# Patient Record
Sex: Male | Born: 1965 | Race: White | Hispanic: No | Marital: Married | Smoking: Current every day smoker
Health system: Southern US, Community
[De-identification: ages and names within clinical notes are randomized; demographics above are authoritative.]

## PROBLEM LIST (undated history)

## (undated) DIAGNOSIS — K648 Other hemorrhoids: Secondary | ICD-10-CM

## (undated) HISTORY — DX: Other hemorrhoids: K64.8

---

## 2010-09-25 ENCOUNTER — Other Ambulatory Visit: Payer: Self-pay | Admitting: Family Medicine

## 2013-07-07 HISTORY — PX: HERNIA REPAIR: SHX51

## 2013-07-22 ENCOUNTER — Ambulatory Visit: Payer: Self-pay | Admitting: Surgery

## 2013-07-22 LAB — BASIC METABOLIC PANEL
Anion Gap: 3 — ABNORMAL LOW (ref 7–16)
BUN: 6 mg/dL — AB (ref 7–18)
CALCIUM: 9.2 mg/dL (ref 8.5–10.1)
CREATININE: 0.75 mg/dL (ref 0.60–1.30)
Chloride: 105 mmol/L (ref 98–107)
Co2: 28 mmol/L (ref 21–32)
GLUCOSE: 101 mg/dL — AB (ref 65–99)
OSMOLALITY: 270 (ref 275–301)
Potassium: 4.2 mmol/L (ref 3.5–5.1)
Sodium: 136 mmol/L (ref 136–145)

## 2013-07-22 LAB — CBC WITH DIFFERENTIAL/PLATELET
BASOS ABS: 0 10*3/uL (ref 0.0–0.1)
BASOS PCT: 0.4 %
Eosinophil #: 0.1 10*3/uL (ref 0.0–0.7)
Eosinophil %: 1.3 %
HCT: 49.3 % (ref 40.0–52.0)
HGB: 16.7 g/dL (ref 13.0–18.0)
Lymphocyte #: 2.6 10*3/uL (ref 1.0–3.6)
Lymphocyte %: 28.7 %
MCH: 32.9 pg (ref 26.0–34.0)
MCHC: 33.8 g/dL (ref 32.0–36.0)
MCV: 97 fL (ref 80–100)
MONO ABS: 0.6 x10 3/mm (ref 0.2–1.0)
MONOS PCT: 7 %
Neutrophil #: 5.6 10*3/uL (ref 1.4–6.5)
Neutrophil %: 62.6 %
Platelet: 197 10*3/uL (ref 150–440)
RBC: 5.07 10*6/uL (ref 4.40–5.90)
RDW: 13.7 % (ref 11.5–14.5)
WBC: 9 10*3/uL (ref 3.8–10.6)

## 2013-07-29 ENCOUNTER — Ambulatory Visit: Payer: Self-pay | Admitting: Surgery

## 2014-07-30 NOTE — H&P (Signed)
PATIENT NAME:  Jeffery Mccormick, Jeffery Mccormick MR#:  [id number] DATE OF BIRTH:  22-Feb-1966  DATE OF ADMISSION:  07/29/2013  CHIEF COMPLAINT:  Right inguinal hernia.   HISTORY OF PRESENT ILLNESS:  This is a patient with a right inguinal hernia.  He is 49 years old.  He has pain and bulge on the right side only.  No left-sided symptoms.  He has had this for four or five months.  He has not had any obstructive symptoms.   PAST SURGICAL HISTORY:  None.   PAST MEDICAL HISTORY:  None.   MEDICATIONS:  None.   ALLERGIES:  None.   SOCIAL HISTORY:  The patient smokes tobacco on a regular basis.   REVIEW OF SYSTEMS:   A 10 system review has been performed and negative with the exception of that mentioned in the HPI and is documented in the office chart.   PHYSICAL EXAMINATION: GENERAL:  Healthy male patient.  HEENT:  No scleral icterus.  NECK:  No palpable neck nodes.  CHEST:  Clear to auscultation.  CARDIAC:  Regular rate and rhythm.  ABDOMEN:  Soft, nontender.  EXTREMITIES:  Without edema.  NEUROLOGIC:  Grossly intact.  INTEGUMENT:  No jaundice.  GENITOURINARY:  Demonstrates a right inguinal hernia which is reducible.  No left inguinal hernia.  Normal testicles.   LABORATORY VALUES:  Not currently available, but will be reviewed prior to surgery.   ASSESSMENT AND PLAN:  This is a patient with a right inguinal hernia.  He is here for elective laparoscopic preperitoneal repair of an inguinal hernia.  The option has been discussed.  The risks of bleeding, infection, recurrence, ischemic orchitis, chronic pain syndrome and neuroma have been discussed and will be reviewed in the preop holding area.  He understands that this is the plan.    ____________________________ Jerrol Banana. Burt Knack, MD rec:ea D: 07/28/2013 16:58:18 ET T: 07/28/2013 17:27:54 ET JOB#: [id number]  cc: Jerrol Banana. Burt Knack, MD, <Dictator> Florene Glen MD ELECTRONICALLY SIGNED 07/28/2013 17:46

## 2014-07-30 NOTE — Op Note (Signed)
PATIENT NAME:  Jeffery Mccormick, BUTH MR#:  [id number] DATE OF BIRTH:  August 04, 1965  DATE OF PROCEDURE:  07/29/2013  PREOPERATIVE DIAGNOSIS: Symptomatic right inguinal hernia.   POSTOPERATIVE DIAGNOSIS: Symptomatic right inguinal hernia.   PROCEDURE: Laparoscopic preperitoneal repair of a right inguinal hernia.   SURGEON: Phoebe Perch, M.D.   ASSISTANT: Azalia Bilis, PA-S.   ANESTHESIA: General, with endotracheal tube.  INDICATIONS:  This is a patient with a right inguinal hernia requiring repair. Preoperatively, we discussed rationale for surgery, the options of observation, risks of bleeding, infection, recurrence, ischemic orchitis, chronic pain syndrome and neuroma. This was all reviewed for him in the preop holding area. He understood and agreed to proceed.   FINDINGS: Large direct space hernia, minimal or small indirect hernia.   DESCRIPTION OF PROCEDURE: The patient was induced to general anesthesia, given IV antibiotics. VTE prophylaxis was in place. A Foley catheter was placed. Then, he was prepped and draped in a sterile fashion. Marcaine was infiltrated in skin and subcutaneous tissues around the periumbilical area. Incision was made. Dissection down to the right rectus fascia was performed. It was incised and muscle retracted laterally. The [place] surgical dissecting balloon was placed followed by the [place] surgical structural balloon, and the preperitoneal space was insufflated, and under direct vision two midline 5 mm ports were placed. Romolo Sieling's ligament was delineated. The lateral extent of dissection was determined and the nerve was kept in view at all times. The cord was skeletonized of a lot of a small indirect sac, which was retracted cephalad. The large direct space hernia was retracted, reduced and then the pseudo sac was tacked to Monserrat Vidaurri's ligament with the [place] surgical tacker.   Into the preperitoneal space was placed a laterally scissored Atrium mesh which was held in place with the [place]  surgical tacker, avoiding the area of the nerve. The preperitoneal space was then desufflated under direct vision. All ports were removed. Fascial edges were approximated with 0 Vicryl figure-of-eight sutures and then 4-0 subcuticular Monocryl was used on all skin edges. Steri-Strips, Mastisol and sterile dressings were placed.   The patient tolerated the procedure well. There were no complications. He was taken to the recovery room in stable condition to be discharged in the care of her family. Follow up in 10 days.    ____________________________ Jerrol Banana Burt Knack, MD rec:sg D: 07/29/2013 11:43:37 ET T: 07/29/2013 12:30:18 ET JOB#: [id number]  cc: Jerrol Banana. Burt Knack, MD, <Dictator> Florene Glen MD ELECTRONICALLY SIGNED 07/29/2013 14:29

## 2017-06-04 ENCOUNTER — Ambulatory Visit: Payer: Self-pay | Admitting: Surgery

## 2017-06-04 ENCOUNTER — Encounter: Payer: Self-pay | Admitting: Surgery

## 2017-06-04 VITALS — BP 158/91 | HR 76 | Temp 98.3°F | Ht 70.0 in | Wt 178.0 lb

## 2017-06-04 DIAGNOSIS — M792 Neuralgia and neuritis, unspecified: Secondary | ICD-10-CM

## 2017-06-04 NOTE — Progress Notes (Signed)
Outpatient Surgical Follow Up  06/04/2017  Jeffery Mccormick is an 52 y.o. male.   CC:rt groin pain  HPI: rt groin pain, no trauma, no bulge, lots of heavy lifting. He is working.  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  He has been able to work fine for the last 4 years since surgery but this episode with the heavy truck seems to have exacerbated his problem.  He also states that with rest he has improved considerably and it is much less painful.  He describes a burning sensation in the distribution of the genitofemoral nerve. See nursing notes for past medical and surgical history.  No past medical history on file.    No family history on file.  Social History:  reports that he has been smoking cigarettes.  he has never used smokeless tobacco. He reports that he does not drink alcohol or use drugs.  Allergies: No Known Allergies  Medications reviewed.   Review of Systems:   Review of Systems  Constitutional: Negative.   HENT: Negative.   Eyes: Negative.   Respiratory: Negative.   Cardiovascular: Negative.   Gastrointestinal: Negative.   Genitourinary: Negative.   Musculoskeletal: Negative.   Skin: Negative.   Neurological: Negative.   Endo/Heme/Allergies: Negative.   Psychiatric/Behavioral: Negative.      Physical Exam:  BP (!) 158/91   Pulse 76   Temp 98.3 F (36.8 C) (Oral)   Ht 5\' 10"  (1.778 m)   Wt 178 lb (80.7 kg)   BMI 25.54 kg/m   Physical Exam  Constitutional: He is oriented to person, place, and time and well-developed, well-nourished, and in no distress. No distress.  HENT:  Head: Normocephalic and atraumatic.  Pulmonary/Chest: Effort normal. No respiratory distress.  Abdominal: Soft. He exhibits no distension. There is no tenderness. There is no rebound.  Genitourinary:  Genitourinary Comments: Patient is examined standing and supine and with Valsalva.   Normal testicles are noted no sign of recurrent hernia on the right is noted and he is nontender no left inguinal hernia palpable  Neurological: He is alert and oriented to person, place, and time.  Skin: Skin is warm and dry. He is not diaphoretic.  Vitals reviewed.     No results found for this or any previous visit (from the past 48 hour(s)). No results found.  Assessment/Plan:  This patient describes classic signs of irritation of the genitofemoral nerve on the right 4 years following laparoscopic right inguinal hernia repair.  It is improving.  It has improved with rest.  I would not recommend any surgical intervention and as it is improving I would not send him to a pain specialist for injection at this time.  He will follow-up on an as-needed basis as I expect this to improve and resolve.  Florene Glen, MD, FACS

## 2017-06-04 NOTE — Patient Instructions (Signed)
Please give us a call in case you have any questions or concerns.  

## 2017-12-12 ENCOUNTER — Ambulatory Visit: Payer: Self-pay | Admitting: Surgery

## 2017-12-24 ENCOUNTER — Encounter: Payer: Self-pay | Admitting: *Deleted

## 2017-12-25 ENCOUNTER — Ambulatory Visit: Payer: Self-pay | Admitting: Anesthesiology

## 2017-12-25 ENCOUNTER — Ambulatory Visit
Admission: RE | Admit: 2017-12-25 | Discharge: 2017-12-25 | Disposition: A | Discharge status: Home / Self Care | Payer: Self-pay | Source: Ambulatory Visit | Attending: Surgery | Admitting: Surgery

## 2017-12-25 ENCOUNTER — Ambulatory Visit: Payer: Self-pay | Admitting: Surgery

## 2017-12-25 ENCOUNTER — Encounter: Payer: Self-pay | Admitting: *Deleted

## 2017-12-25 ENCOUNTER — Encounter
Admission: RE | Disposition: A | Discharge status: Home / Self Care | Payer: Self-pay | Source: Ambulatory Visit | Attending: Surgery

## 2017-12-25 DIAGNOSIS — D125 Benign neoplasm of sigmoid colon: Secondary | ICD-10-CM | POA: Insufficient documentation

## 2017-12-25 DIAGNOSIS — F17210 Nicotine dependence, cigarettes, uncomplicated: Secondary | ICD-10-CM | POA: Insufficient documentation

## 2017-12-25 DIAGNOSIS — Z1211 Encounter for screening for malignant neoplasm of colon: Secondary | ICD-10-CM | POA: Insufficient documentation

## 2017-12-25 DIAGNOSIS — C20 Malignant neoplasm of rectum: Secondary | ICD-10-CM | POA: Insufficient documentation

## 2017-12-25 DIAGNOSIS — K5730 Diverticulosis of large intestine without perforation or abscess without bleeding: Secondary | ICD-10-CM | POA: Insufficient documentation

## 2017-12-25 HISTORY — PX: COLONOSCOPY WITH PROPOFOL: SHX5780

## 2017-12-25 SURGERY — COLONOSCOPY WITH PROPOFOL
Anesthesia: General

## 2017-12-25 MED ORDER — SODIUM CHLORIDE 0.9 % IV SOLN
INTRAVENOUS | Status: DC
  Administered 2017-12-25: 13:00:00 via INTRAVENOUS

## 2017-12-25 MED ORDER — PROPOFOL 500 MG/50ML IV EMUL
INTRAVENOUS | Status: AC
  Filled 2017-12-25: qty 50

## 2017-12-25 MED ORDER — FENTANYL CITRATE (PF) 100 MCG/2ML IJ SOLN
INTRAMUSCULAR | Status: AC
  Filled 2017-12-25: qty 2

## 2017-12-25 MED ORDER — FENTANYL CITRATE (PF) 100 MCG/2ML IJ SOLN
INTRAMUSCULAR | Status: DC | PRN
  Administered 2017-12-25: 50 ug via INTRAVENOUS

## 2017-12-25 MED ORDER — MIDAZOLAM HCL 2 MG/2ML IJ SOLN
INTRAMUSCULAR | Status: AC
  Filled 2017-12-25: qty 2

## 2017-12-25 MED ORDER — MIDAZOLAM HCL 2 MG/2ML IJ SOLN
INTRAMUSCULAR | Status: DC | PRN
  Administered 2017-12-25: 2 mg via INTRAVENOUS

## 2017-12-25 MED ORDER — CHLORHEXIDINE GLUCONATE CLOTH 2 % EX PADS: 6.0000 | MEDICATED_PAD | Freq: Once | CUTANEOUS | Status: DC

## 2017-12-25 MED ORDER — PROPOFOL 500 MG/50ML IV EMUL
INTRAVENOUS | Status: DC | PRN
  Administered 2017-12-25: 120 ug/kg/min via INTRAVENOUS

## 2017-12-25 NOTE — Anesthesia Postprocedure Evaluation (Signed)
Anesthesia Post Note  Patient: Jeffery Mccormick  Procedure(s) Performed: COLONOSCOPY WITH PROPOFOL (N/A )  Patient location during evaluation: Endoscopy Anesthesia Type: General Level of consciousness: awake and alert Pain management: pain level controlled Vital Signs Assessment: post-procedure vital signs reviewed and stable Respiratory status: spontaneous breathing and respiratory function stable Cardiovascular status: stable Anesthetic complications: no     Last Vitals:  Vitals:   12/25/17 1256 12/25/17 1400  BP: (!) 145/90 125/84  Pulse: 78 68  Resp: 18 20  Temp: (!) 36.4 C (!) 36.1 C    Last Pain:  Vitals:   12/25/17 1400  TempSrc: Tympanic  PainSc: 0-No pain                 KEPHART,WILLIAM K

## 2017-12-25 NOTE — Anesthesia Post-op Follow-up Note (Signed)
Anesthesia QCDR form completed.        

## 2017-12-25 NOTE — Op Note (Addendum)
[facility] Gastroenterology Patient Name: Jeffery Mccormick Procedure Date: 12/25/2017 1:13 PM MRN: [id number] Account #: [id number] Date of Birth: 21-Oct-1965 Admit Type: Outpatient Age: 52 Room: [facility] ENDO ROOM 2 Gender: Male Note Status: Addendum Procedure:            Colonoscopy Indications:          Screening for colorectal malignant neoplasm Providers:            Eliseo Squires MD, MD Medicines:            Monitored Anesthesia Care Complications:        none apparent                       No immediate complications. Procedure:            Pre-Anesthesia Assessment:                       - After reviewing the risks and benefits, the patient                        was deemed in satisfactory condition to undergo the                        procedure.                       The colonoscopy was somewhat difficult due to                        inadequate bowel prep. Successful completion of the                        procedure was aided by lavage. The entire colon was                        examined. The Colonoscope was introduced through the                        anus and advanced to the the cecum, identified by                        appendiceal orifice and ileocecal valve. Findings:      sigmoid polyp x1, rectal polyp x1, and large anal mass      Multiple small-mouthed diverticula were found in the sigmoid colon.       Biopsies were taken with a cold forceps for histology.      The digital rectal exam revealed a 4 cm (diameter) hard anal mass       palpated 5 to 6 cm from the anal verge. The mass was non-circumferential       and located predominantly at the right-anterior bowel wall. Impression:           anal mass, likely malignant, pending pathology                       - Diverticulosis in the sigmoid colon. Biopsied.                       - Anal mass 5 to 6 cm from the anal verge.                       -  A few 1 mm, non-bleeding polyps in the sigmoid colon,                   removed with a cold biopsy forceps. Resected and                        retrieved. Biopsied. Recommendation:       f/u with colorectal pending pathology                       - Discharge patient to home [Means]. Attending Participation:      I personally performed the entire procedure. Dr. Sheppard Penton, MD Eliseo Squires MD, MD 12/25/2017 2:21:48 PM This report has been signed electronically. Number of Addenda: 1 Note Initiated On: 12/25/2017 1:13 PM Scope Withdrawal Time: 0 hours 24 minutes 45 seconds  Total Procedure Duration: 0 hours 35 minutes 0 seconds  Estimated Blood Loss: 59ml      [facility] Addendum Number: 1   Addendum Date: 02/11/2018 11:22:12 AM      Error noted in previous procedure note placed in documentation.      Anal mass noted in documentation was removed via hot snare biopsy, not a       cold snare biopsy. This is confirmed within the pathology report. Dr. Sheppard Penton, MD Eliseo Squires MD, MD 02/11/2018 11:23:53 AM This report has been signed electronically.

## 2017-12-25 NOTE — Anesthesia Preprocedure Evaluation (Signed)
Anesthesia Evaluation  Patient identified by MRN, date of birth, ID band Patient awake    Reviewed: Allergy & Precautions, NPO status , Patient's Chart, lab work & pertinent test results  History of Anesthesia Complications Negative for: history of anesthetic complications  Airway Mallampati: II       Dental   Pulmonary neg sleep apnea, neg COPD, Current Smoker,           Cardiovascular (-) hypertension(-) Past MI and (-) CHF (-) dysrhythmias (-) Valvular Problems/Murmurs     Neuro/Psych neg Seizures    GI/Hepatic Neg liver ROS, neg GERD  ,  Endo/Other  neg diabetes  Renal/GU negative Renal ROS     Musculoskeletal   Abdominal   Peds  Hematology   Anesthesia Other Findings   Reproductive/Obstetrics                             Anesthesia Physical Anesthesia Plan  ASA: II  Anesthesia Plan: General   Post-op Pain Management:    Induction: Intravenous  PONV Risk Score and Plan: 1 and Propofol infusion and TIVA  Airway Management Planned: Nasal Cannula  Additional Equipment:   Intra-op Plan:   Post-operative Plan:   Informed Consent: I have reviewed the patients History and Physical, chart, labs and discussed the procedure including the risks, benefits and alternatives for the proposed anesthesia with the patient or authorized representative who has indicated his/her understanding and acceptance.     Plan Discussed with:   Anesthesia Plan Comments:         Anesthesia Quick Evaluation

## 2017-12-25 NOTE — H&P (Signed)
Subjective:  CC: External hemorrhoid, bleeding [K64.4]   HPI:  Jeffery Mccormick is a 52 y.o. male who was referrred by Lurlean Leyden, * for above. Symptoms were first noted 5 years ago. he has some rectal bleeding occurring after every BM starting 26mo or so ago times per week. Bleeding is described as just notes blood on tissue paper, blood streaking outside of stool, with occasional bleeding in between BMs. Pain is achy, confined to the perianal region, without radiation.  Associated with nothing specific, exacerbated by hard stools.  Symptoms are inhibiting him from working.  Also complains of fecal incontinence and extremely foul smelling flatus.  Toilet habits: Used to have daily BMs, but now has become more irregular with constipation and hard stools, with straining.     Patient does not have a personal history of colon cancer. Patient does not have a personal history of IBD. Patient admits to to history of polyps.  Last colonoscopy in 2015, recommended to return in three years for three adenomas biopsied.   Past Medical History: none reported  Past Surgical History:  has a past surgical history that includes Colonoscopy (06/24/2013).  Family History: family history is not on file.  Social History:  reports that he has been smoking.  He has never used smokeless tobacco. He reports that he drinks alcohol. He reports that he does not use drugs.  Current Medications: has a current medication list which includes the following prescription(s): hydrocodone-acetaminophen and lidocaine.  Allergies:     Allergies as of 12/10/2017  . (No Known Allergies)    ROS:  A 15 point review of systems was performed and pertinent positives and negatives noted in HPI  Objective:   BP (!) 162/100   Pulse 76   Temp 36.8 C (98.3 F) (Oral)   Ht 179.1 cm (5' 10.5")   Wt 84.8 kg (187 lb)   BMI 26.45 kg/m   Constitutional :  alert, appears stated age, cooperative and no distress   Lymphatics/Throat::  no asymmetry, masses, or scars  Respiratory:  clear to auscultation bilaterally  Cardiovascular:  regular rate and rhythm  Gastrointestinal: soft, non-tender; bowel sounds normal; no masses,  no organomegaly.    Musculoskeletal: Steady gait and movement  Skin: Cool and moist,   Psychiatric: Normal affect, non-agitated, not confused  Genital/Rectal: Chaperone present for exam.  Engorged but non-thrombosed external hemorrhoids noted on right left lateral columns.  DRE revealed, normal rectal tone, with palpable internal hemorrhoid noted in lateral columns again with minimal discomfort.  After obtaining verbal consent, an anoscope was inserted after preppped with lubricant and internal hemorrhoids noted on DRE confirmed visually, with no evidence of thrombosis, some bleeding noted.  No other pathology such as fissures, fistulas, polyps noted.  Scope withdrawn and patient tolerate procedure well.     LABS:  n/a  RADS: n/a Assessment:   External hemorrhoid, bleeding [K64.4]  Plan:  1. External hemorrhoid, bleeding [K64.4] Discussed risks/benefits/alternatives to surgery.  Alternatives include the options of observation, medical management.  Benefits include symptomatic relief.  I discussed  in detail and the complications related to the operation and the anesthesia, including bleeding, infection, recurrence, remote possibility of temporary or permanent fecal incontinence, poor/delayed wound healing, chronic pain, and additional procedures to address said risks. The risks of general anesthetic, if used, includes MI, CVA, sudden death or even reaction to anesthetic medications also discussed.   We also discussed typical post operative recovery which includes weeks to potentially months of anal pain,  drainage, occasional bleeding, and sense of fecal urgency.  Size and degree of external hemorrhoids does not warrant surgery at this time, especially with his current bowel  habits.  Can proceed with hemorrhoid banding for bleeding internal hemorrhoids.  Also overdue for colonoscopy for history of polyps.  Patient will call us to confirm timing of both banding and colonoscopy since he is selfpay.    ED return precautions given for sudden increase in pain, bleeding, with possible accompanying fever, nausea, and/or vomiting.  The patient understands the risks, any and all questions were answered to the patient and wife's satisfaction.      Electronically signed by Benjamine Sprague, DO on 12/10/2017 12:24 PM

## 2017-12-25 NOTE — Anesthesia Procedure Notes (Signed)
Performed by: Cook-Martin, Ellis Koffler Pre-anesthesia Checklist: Patient identified, Emergency Drugs available, Suction available, Patient being monitored and Timeout performed Patient Re-evaluated:Patient Re-evaluated prior to induction Oxygen Delivery Method: Nasal cannula Preoxygenation: Pre-oxygenation with 100% oxygen Induction Type: IV induction Placement Confirmation: positive ETCO2 and CO2 detector       

## 2017-12-25 NOTE — Transfer of Care (Signed)
Immediate Anesthesia Transfer of Care Note  Patient: Jeffery Mccormick  Procedure(s) Performed: COLONOSCOPY WITH PROPOFOL (N/A )  Patient Location: PACU and Endoscopy Unit  Anesthesia Type:General  Level of Consciousness: drowsy  Airway & Oxygen Therapy: Patient Spontanous Breathing  Post-op Assessment: Report given to RN and Post -op Vital signs reviewed and stable  Post vital signs: Reviewed and stable  Last Vitals:  Vitals Value Taken Time  BP    Temp    Pulse    Resp 16 12/25/2017  2:01 PM  SpO2    Vitals shown include unvalidated device data.  Last Pain:  Vitals:   12/25/17 1256  TempSrc: Tympanic  PainSc: 0-No pain         Complications: No apparent anesthesia complications

## 2017-12-26 ENCOUNTER — Other Ambulatory Visit: Payer: Self-pay | Admitting: Anatomic Pathology & Clinical Pathology

## 2017-12-26 LAB — SURGICAL PATHOLOGY

## 2017-12-28 ENCOUNTER — Encounter: Payer: Self-pay | Admitting: Surgery

## 2018-01-14 ENCOUNTER — Other Ambulatory Visit: Payer: Self-pay | Admitting: General Surgery

## 2018-01-14 DIAGNOSIS — C20 Malignant neoplasm of rectum: Secondary | ICD-10-CM

## 2018-01-16 ENCOUNTER — Ambulatory Visit
Admission: RE | Admit: 2018-01-16 | Discharge: 2018-01-16 | Disposition: A | Discharge status: Home / Self Care | Payer: Self-pay | Source: Ambulatory Visit | Attending: General Surgery | Admitting: General Surgery

## 2018-01-16 DIAGNOSIS — R918 Other nonspecific abnormal finding of lung field: Secondary | ICD-10-CM | POA: Insufficient documentation

## 2018-01-16 DIAGNOSIS — C20 Malignant neoplasm of rectum: Secondary | ICD-10-CM | POA: Insufficient documentation

## 2018-01-16 MED ORDER — IOPAMIDOL (ISOVUE-300) INJECTION 61%
100.0000 mL | Freq: Once | INTRAVENOUS | Status: AC | PRN
  Administered 2018-01-16: 100 mL via INTRAVENOUS

## 2018-01-23 ENCOUNTER — Ambulatory Visit
Admission: RE | Admit: 2018-01-23 | Discharge: 2018-01-23 | Disposition: A | Discharge status: Home / Self Care | Payer: Self-pay | Source: Ambulatory Visit | Attending: General Surgery | Admitting: General Surgery

## 2018-01-23 DIAGNOSIS — C20 Malignant neoplasm of rectum: Secondary | ICD-10-CM | POA: Insufficient documentation

## 2018-01-23 MED ORDER — GADOBUTROL 1 MMOL/ML IV SOLN
8.0000 mL | Freq: Once | INTRAVENOUS | Status: AC | PRN
  Administered 2018-01-23: 8 mL via INTRAVENOUS

## 2018-01-26 ENCOUNTER — Other Ambulatory Visit: Payer: Self-pay

## 2018-01-26 ENCOUNTER — Encounter: Payer: Self-pay | Admitting: Radiation Oncology

## 2018-01-26 ENCOUNTER — Ambulatory Visit
Admission: RE | Admit: 2018-01-26 | Discharge: 2018-01-26 | Disposition: A | Discharge status: Home / Self Care | Payer: Self-pay | Source: Ambulatory Visit | Attending: Radiation Oncology | Admitting: Radiation Oncology

## 2018-01-26 VITALS — BP 152/95 | HR 82 | Temp 98.5°F | Resp 18 | Ht 70.0 in | Wt 187.9 lb

## 2018-01-26 DIAGNOSIS — R918 Other nonspecific abnormal finding of lung field: Secondary | ICD-10-CM | POA: Insufficient documentation

## 2018-01-26 DIAGNOSIS — C20 Malignant neoplasm of rectum: Secondary | ICD-10-CM | POA: Insufficient documentation

## 2018-01-26 DIAGNOSIS — F17210 Nicotine dependence, cigarettes, uncomplicated: Secondary | ICD-10-CM | POA: Insufficient documentation

## 2018-01-26 NOTE — Addendum Note (Signed)
Encounter addended by: [name], RN on: 01/26/2018 11:45 AM  Actions taken: Flowsheet accepted

## 2018-01-26 NOTE — Consult Note (Signed)
NEW PATIENT EVALUATION  Name: Jeffery Mccormick  MRN: [id number]  Date:   01/26/2018     DOB: 1966/02/22   This 52 y.o. 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  REFERRING PHYSICIAN: [facility]  CHIEF COMPLAINT:  Chief Complaint  Patient presents with  . Cancer    Pt is here for initial consultation of rectal cancer    DIAGNOSIS: The encounter diagnosis was Rectal cancer ([place]).   PREVIOUS INVESTIGATIONS:  MRI scans and CT scans reviewed Pathology report reviewed Clinical notes reviewed  HPI: patient is a 52 year old male whose presented with several month history of bright red blood per rectumas well as pain on defecation initially thought to be hemorrhoids. Colonoscopy demonstrated a distal rectal mass which was biopsy positive forinvasive mammary carcinoma moderately differentiated with ulceration.CT scan demonstrated wall thickening involving the lower rectum with no evidence of lymphadenopathy or metastatic disease. He does have scattered small bilateral pulmonary nodules measuring up to 3 mm nonspecific but considered only suspicious for metastatic disease. He's been having some pain and discomfort in his left inguinal region which may be a sebaceous cyst. He has been seen by surgeon who has referred him for neoadjuvant chemotherapy treatment prior to probable AP resection. Patient is seen today for consultation with radiation oncology is doing fairly well still is having some minor bleeding and pain on defecation. He is not yet seen medical oncology.  PLANNED TREATMENT REGIMEN: concurrent neoadjuvant chemoradiation prior to surgical resection of distal adenocarcinoma the rectum  PAST MEDICAL HISTORY:  has no past medical history on file.    PAST SURGICAL HISTORY:  Past Surgical History:  Procedure Laterality Date  . COLONOSCOPY WITH PROPOFOL N/A 12/25/2017   Procedure: COLONOSCOPY WITH  PROPOFOL;  Surgeon: Benjamine Sprague, DO;  Location: [facility] ENDOSCOPY;  Service: General;  Laterality: N/A;  . HERNIA REPAIR  07/2013   Right Inguinal-Dr. Burt Knack    FAMILY HISTORY: family history is not on file.  SOCIAL HISTORY:  reports that he has been smoking cigarettes. He has never used smokeless tobacco. He reports that he drinks alcohol. He reports that he does not use drugs.  ALLERGIES: Patient has no known allergies.  MEDICATIONS:  No current outpatient medications on file.   No current facility-administered medications for this encounter.     ECOG PERFORMANCE STATUS:  1 - Symptomatic but completely ambulatory  REVIEW OF SYSTEMS: except for the blood per rectum and pain on defecation  Patient denies any weight loss, fatigue, weakness, fever, chills or night sweats. Patient denies any loss of vision, blurred vision. Patient denies any ringing  of the ears or hearing loss. No irregular heartbeat. Patient denies heart murmur or history of fainting. Patient denies any chest pain or pain radiating to her upper extremities. Patient denies any shortness of breath, difficulty breathing at night, cough or hemoptysis. Patient denies any swelling in the lower legs. Patient denies any nausea vomiting, vomiting of blood, or coffee ground material in the vomitus. Patient denies any stomach pain. Patient states has had normal bowel movements no significant constipation or diarrhea. Patient denies any dysuria, hematuria or significant nocturia. Patient denies any problems walking, swelling in the joints or loss of balance. Patient denies any skin changes, loss of hair or loss of weight. Patient denies any excessive worrying or anxiety or significant depression. Patient denies any problems with insomnia. Patient denies excessive thirst, polyuria, polydipsia. Patient denies any swollen glands, patient denies  easy bruising or easy bleeding. Patient denies any recent infections, allergies or URI. Patient "s  visual fields have not changed significantly in recent time.MRI scan was performed show low rectal adenocarcinoma early stage TIII with invasion of the muscularis propria. By about 3 mm    PHYSICAL EXAM: BP (!) 152/95 (BP Location: Left Arm)   Pulse 82   Temp 98.5 F (36.9 C) (Tympanic)   Resp 18   Ht 5\' 10"  (1.778 m)   Wt 187 lb 15.1 oz (85.2 kg)   BMI 26.97 kg/m  Patient does have some slight nodes present his left inguinal area although this could certainly be a sebaceous cyst do not believe is related to his rectal cancer. No real CT correlation or MRI correlation to this area.Well-developed well-nourished patient in NAD. HEENT reveals PERLA, EOMI, discs not visualized.  Oral cavity is clear. No oral mucosal lesions are identified. Neck is clear without evidence of cervical or supraclavicular adenopathy. Lungs are clear to A&P. Cardiac examination is essentially unremarkable with regular rate and rhythm without murmur rub or thrill. Abdomen is benign with no organomegaly or masses noted. Motor sensory and DTR levels are equal and symmetric in the upper and lower extremities. Cranial nerves II through XII are grossly intact. Proprioception is intact. No peripheral adenopathy or edema is identified. No motor or sensory levels are noted. Crude visual fields are within normal range.  LABORATORY DATA: pathology reports reviewed    RADIOLOGY RESULTS:MRI scans and CT scans reviewed   IMPRESSION: stage IIa adenocarcinoma the distal rectum in 52 year old male  PLAN: at this time I would recommend concurrent neoadjuvant chemoradiation therapy prior to surgical resection. Would plan on delivering 4500 cGy to his distal rectum and regional lymph nodes boosting his tumor another 540 cGy. I'd like him to see medical oncology as treatment options including initial chemotherapy prior to neoadjuvant chemoradiation may be entertained. Risks and benefits of radiation including diarrhea fatigue alteration of  blood counts possible increase in lower urinary tract symptoms skin reaction all were discussed in detail. I have tentatively given him in a simulation date of early next week. We'll coordinate our treatments with medical oncology. Patient is to call this time with any concerns. Both he and his wife both seem to comprehend my treatment plan well.  I would like to take this opportunity to thank you for allowing me to participate in the care of your patient.Noreene Filbert, MD

## 2018-01-26 NOTE — Progress Notes (Signed)
Met with Mr. Jeffery Mccormick and his spouse, during and after consult with Dr. Baruch Gouty. Introduced nurse navigator services and provided contact information for any future needs. Reviewed results of MRI/CT and provided copy. Arranged medical oncology appointment. He will see Dr. Tasia Catchings 10/24 at 1530. All questions answered.

## 2018-01-28 ENCOUNTER — Ambulatory Visit: Payer: Self-pay

## 2018-01-29 ENCOUNTER — Inpatient Hospital Stay: Payer: Self-pay | Attending: Oncology | Admitting: Oncology

## 2018-01-29 ENCOUNTER — Other Ambulatory Visit: Payer: Self-pay

## 2018-01-29 ENCOUNTER — Inpatient Hospital Stay: Payer: Self-pay

## 2018-01-29 ENCOUNTER — Encounter: Payer: Self-pay | Admitting: Oncology

## 2018-01-29 VITALS — BP 138/78 | HR 92 | Temp 96.7°F | Resp 18 | Ht 70.0 in | Wt 185.3 lb

## 2018-01-29 DIAGNOSIS — C20 Malignant neoplasm of rectum: Secondary | ICD-10-CM | POA: Insufficient documentation

## 2018-01-29 DIAGNOSIS — F17210 Nicotine dependence, cigarettes, uncomplicated: Secondary | ICD-10-CM | POA: Insufficient documentation

## 2018-01-29 DIAGNOSIS — R102 Pelvic and perineal pain: Secondary | ICD-10-CM

## 2018-01-29 LAB — COMPREHENSIVE METABOLIC PANEL
ALT: 23 U/L (ref 0–44)
AST: 21 U/L (ref 15–41)
Albumin: 4.6 g/dL (ref 3.5–5.0)
Alkaline Phosphatase: 90 U/L (ref 38–126)
Anion gap: 8 (ref 5–15)
BUN: 12 mg/dL (ref 6–20)
CHLORIDE: 105 mmol/L (ref 98–111)
CO2: 24 mmol/L (ref 22–32)
CREATININE: 0.87 mg/dL (ref 0.61–1.24)
Calcium: 9.7 mg/dL (ref 8.9–10.3)
Glucose, Bld: 101 mg/dL — ABNORMAL HIGH (ref 70–99)
Potassium: 3.9 mmol/L (ref 3.5–5.1)
Sodium: 137 mmol/L (ref 135–145)
Total Bilirubin: 0.6 mg/dL (ref 0.3–1.2)
Total Protein: 8.2 g/dL — ABNORMAL HIGH (ref 6.5–8.1)

## 2018-01-29 LAB — CBC WITH DIFFERENTIAL/PLATELET
ABS IMMATURE GRANULOCYTES: 0.02 10*3/uL (ref 0.00–0.07)
BASOS PCT: 1 %
Basophils Absolute: 0.1 10*3/uL (ref 0.0–0.1)
Eosinophils Absolute: 0.2 10*3/uL (ref 0.0–0.5)
Eosinophils Relative: 2 %
HCT: 49.4 % (ref 39.0–52.0)
Hemoglobin: 17.3 g/dL — ABNORMAL HIGH (ref 13.0–17.0)
IMMATURE GRANULOCYTES: 0 %
Lymphocytes Relative: 26 %
Lymphs Abs: 2.6 10*3/uL (ref 0.7–4.0)
MCH: 32.9 pg (ref 26.0–34.0)
MCHC: 35 g/dL (ref 30.0–36.0)
MCV: 93.9 fL (ref 80.0–100.0)
Monocytes Absolute: 1 10*3/uL (ref 0.1–1.0)
Monocytes Relative: 10 %
NEUTROS ABS: 6.2 10*3/uL (ref 1.7–7.7)
NEUTROS PCT: 61 %
NRBC: 0 % (ref 0.0–0.2)
PLATELETS: 267 10*3/uL (ref 150–400)
RBC: 5.26 MIL/uL (ref 4.22–5.81)
RDW: 13.1 % (ref 11.5–15.5)
WBC: 9.9 10*3/uL (ref 4.0–10.5)

## 2018-01-29 NOTE — Progress Notes (Signed)
Patient here for initial visit. States that he has been having blood in his stools for approximately 7 months.

## 2018-01-30 ENCOUNTER — Telehealth: Payer: Self-pay

## 2018-01-30 LAB — CEA: CEA1: 11.6 ng/mL — AB (ref 0.0–4.7)

## 2018-01-30 NOTE — Telephone Encounter (Signed)
Mr. Jeffery Mccormick reported that he is having a yellow/foul drainage from "knots" around his perineum and/or rectum. States that these areas move. I have called and arranged for him to be reevaluated by Dr. Lysle Pearl on Monday, 10/28. Called and left Mr. Nadara Mustard a voicemail regarding the appointment details and asked him to return call to confirm. Oncology Nurse Navigator Documentation  Navigator Location: [facility] (01/30/18 1000)   )Navigator Encounter Type: Telephone (01/30/18 1000) Telephone: Lahoma Crocker Call;Appt Confirmation/Clarification (01/30/18 1000)                                                  Time Spent with Patient: 15 (01/30/18 1000)

## 2018-02-01 ENCOUNTER — Encounter: Payer: Self-pay | Admitting: Oncology

## 2018-02-01 DIAGNOSIS — C20 Malignant neoplasm of rectum: Secondary | ICD-10-CM | POA: Insufficient documentation

## 2018-02-01 NOTE — Progress Notes (Signed)
Hematology/Oncology Consult note [facility] Telephone:([phone] [phone]   Patient Care Team: Katheren Shams as PCP - General [name], RN as Registered Nurse  REFERRING PROVIDER: Dr.Thomas CHIEF COMPLAINTS/REASON FOR VISIT:  Evaluation of rectal cancer  HISTORY OF PRESENTING ILLNESS:  Jeffery Mccormick is a  52 y.o.  male with PMH listed below who was referred to me for evaluation of rectal cancer.   He was evaluated by surgeon for evaluation of chronic rectal bleeding, ongoing chronic hemorrhoids.  12/25/2017 colonoscopy : revealed anal mass, 5-6 cm from the anal verge.  - Diverticulosis in the sigmoid colon. Biopsied. - A few 1 mm, non-bleeding polyps in the sigmoid colon, removed with a cold biopsy forceps. Resected and retrieved. Biopsied.  Biopsy showed  A. COLON POLYP, SIGMOID; COLD BIOPSY: - HYPERPLASTIC POLYP.  - NEGATIVE FOR DYSPLASIA AND MALIGNANCY.   B. RECTUM POLYP; COLD BIOPSY: - HYPERPLASTIC POLYP.  - NEGATIVE FOR DYSPLASIA AND MALIGNANCY.   C. RECTAL MASS; HOT SNARE:  - INVASIVE ADENOCARCINOMA, MODERATELY DIFFERENTIATED, ULCERATED.   # CT chest abdomen pelvis 01/16/2018 showed Wall thickening involving the lower rectum, corresponding to the patient's known rectal cancer. No findings specific for metastatic disease.  Scattered small bilateral pulmonary nodules measuring up to 3 mm, nonspecific but not considered overly suspicious for metastatic disease.   Today patient reports continues to have rectal bleeding, also pain of his perineum area. Also reports having yellow/foul drainage from perineum. No fever or chills.   Review of Systems  Constitutional: Negative for chills, fever, malaise/fatigue and weight loss.  HENT: Negative for nosebleeds and sore throat.   Eyes: Negative for double vision, photophobia and redness.  Respiratory: Negative for cough, shortness of breath and wheezing.     Cardiovascular: Negative for chest pain, palpitations and orthopnea.  Gastrointestinal: Positive for blood in stool. Negative for abdominal pain, nausea and vomiting.       Perineum pain/drainage.   Genitourinary: Negative for dysuria.  Musculoskeletal: Negative for back pain, myalgias and neck pain.  Skin: Negative for itching and rash.  Neurological: Negative for dizziness, tingling and tremors.  Endo/Heme/Allergies: Negative for environmental allergies. Does not bruise/bleed easily.  Psychiatric/Behavioral: Negative for depression.    MEDICAL HISTORY:  Past Medical History:  Diagnosis Date  . Hemorrhoid prolapse     SURGICAL HISTORY: Past Surgical History:  Procedure Laterality Date  . COLONOSCOPY WITH PROPOFOL N/A 12/25/2017   Procedure: COLONOSCOPY WITH PROPOFOL;  Surgeon: Benjamine Sprague, DO;  Location: [facility] ENDOSCOPY;  Service: General;  Laterality: N/A;  . HERNIA REPAIR  07/2013   Right Inguinal-Dr. Burt Knack    SOCIAL HISTORY: Social History   Socioeconomic History  . Marital status: Married    Spouse name: Not on file  . Number of children: Not on file  . Years of education: Not on file  . Highest education level: Not on file  Occupational History  . Occupation: unemployed  Social Needs  . Financial resource strain: Not on file  . Food insecurity:    Worry: Not on file    Inability: Not on file  . Transportation needs:    Medical: Not on file    Non-medical: Not on file  Tobacco Use  . Smoking status: Current Every Day Smoker    Packs/day: 1.00    Years: 30.00    Pack years: 30.00    Types: Cigarettes  . Smokeless tobacco: Never Used  Substance and Sexual Activity  . Alcohol use: Yes  Frequency: Never    Comment: occasionally  . Drug use: No  . Sexual activity: Not on file  Lifestyle  . Physical activity:    Days per week: Not on file    Minutes per session: Not on file  . Stress: Not on file  Relationships  . Social connections:    Talks on  phone: Not on file    Gets together: Not on file    Attends religious service: Not on file    Active member of club or organization: Not on file    Attends meetings of clubs or organizations: Not on file    Relationship status: Not on file  . Intimate partner violence:    Fear of current or ex partner: Not on file    Emotionally abused: Not on file    Physically abused: Not on file    Forced sexual activity: Not on file  Other Topics Concern  . Not on file  Social History Narrative  . Not on file    FAMILY HISTORY: Family History  Family history unknown: Yes  patient reports that he does not know any family history.   ALLERGIES:  has No Known Allergies.  MEDICATIONS:  Current Outpatient Medications  Medication Sig Dispense Refill  . polyethylene glycol (MIRALAX / GLYCOLAX) packet Take 17 g by mouth daily as needed.     No current facility-administered medications for this visit.    RADIOGRAPHIC STUDIES: I have personally reviewed the radiological images as listed and agreed with the findings in the report. 01/23/2018 MRI pelvis with and without contrast Low rectal/anorectal adenocarcinoma T stage:  Early T3 Rectal adenocarcinoma N stage:  N0 Distance from tumor to the anal sphincter is minimal. The tumor extends to or just above the insertion of the external sphincter.  PHYSICAL EXAMINATION: ECOG PERFORMANCE STATUS: 1 - Symptomatic but completely ambulatory Vitals:   01/29/18 1547  BP: 138/78  Pulse: 92  Resp: 18  Temp: (!) 96.7 F (35.9 C)   Filed Weights   01/29/18 1547  Weight: 185 lb 4.8 oz (84.1 kg)    Physical Exam  Constitutional: He is oriented to person, place, and time. No distress.  HENT:  Head: Normocephalic and atraumatic.  Mouth/Throat: Oropharynx is clear and moist.  Eyes: Pupils are equal, round, and reactive to light. EOM are normal. No scleral icterus.  Neck: Normal range of motion. Neck supple.  Cardiovascular: Normal rate, regular rhythm  and normal heart sounds.  Pulmonary/Chest: Effort normal. No respiratory distress. He has no wheezes.  Abdominal: Soft. Bowel sounds are normal. He exhibits no distension and no mass. There is no tenderness.  Musculoskeletal: Normal range of motion. He exhibits no edema or deformity.  Neurological: He is alert and oriented to person, place, and time. No cranial nerve deficit. Coordination normal.  Skin: Skin is warm and dry. No rash noted. No erythema.  Psychiatric: He has a normal mood and affect. His behavior is normal. Thought content normal.     LABORATORY DATA:  I have reviewed the data as listed Lab Results  Component Value Date   WBC 9.9 01/29/2018   HGB 17.3 (H) 01/29/2018   HCT 49.4 01/29/2018   MCV 93.9 01/29/2018   PLT 267 01/29/2018   Recent Labs    01/29/18 1623  NA 137  K 3.9  CL 105  CO2 24  GLUCOSE 101*  BUN 12  CREATININE 0.87  CALCIUM 9.7  GFRNONAA >60  GFRAA >60  PROT 8.2*  ALBUMIN  4.6  AST 21  ALT 23  ALKPHOS 90  BILITOT 0.6   Iron/TIBC/Ferritin/ %Sat No results found for: IRON, TIBC, FERRITIN, IRONPCTSAT      ASSESSMENT & PLAN:  1. Rectal cancer ([place])   2. Perineum pain, male   Cancer Staging Rectal cancer [facility]) Staging form: Colon and Rectum, AJCC 8th Edition - Clinical stage from 01/29/2018: Stage IIA (cT3, cN0, cM0) - Signed by Earlie Server, MD on 02/01/2018  Images were independently reviewed.  I discussed with patient about colonoscopy finding, as well as pathology findings, and CT/MRI findings.  The diagnosis of rectal cancer and care plan were discussed with patient in detail.  NCCN guidelines were reviewed and shared with patient.   Recommend total neoadjuvant chemotherapy followed by neoadjuvant chemotherapy/Radiation and prior to surgical resection.   We had discussed the composition of chemotherapy regimen, length of chemo cycle, duration of treatment and the time to assess response to treatment.  Supportive care measures are  necessary for patient well-being and will be provided as necessary. We spent sufficient time to discuss many aspect of care, questions were answered to patient's satisfaction.  # chemotherapy education, will arrange medi port placement.  #  Perineum pain, drainage, will send to Dr.Sakai for further evaluation.  # check cbc, cmp and baseline CEA.   Orders Placed This Encounter  Procedures  . CBC with Differential/Platelet    Standing Status:   Future    Number of Occurrences:   1    Standing Expiration Date:   01/30/2019  . Comprehensive metabolic panel    Standing Status:   Future    Number of Occurrences:   1    Standing Expiration Date:   01/30/2019  . CEA    Standing Status:   Future    Number of Occurrences:   1    Standing Expiration Date:   01/30/2019    All questions were answered. The patient knows to call the clinic with any problems questions or concerns.  Return of visit: 1 week for evaluation prior to starting cycle 1 chemotherapy.  Thank you for this kind referral and the opportunity to participate in the care of this patient. A copy of today's note is routed to referring provider  Total face to face encounter time for this patient visit was 60 min. >50% of the time was  spent in counseling and coordination of care.    Earlie Server, MD, PhD Hematology Oncology [facility] at [facility] Pager- [phone] 02/01/2018

## 2018-02-02 ENCOUNTER — Telehealth: Payer: Self-pay

## 2018-02-02 ENCOUNTER — Ambulatory Visit: Payer: Self-pay | Admitting: Surgery

## 2018-02-02 ENCOUNTER — Ambulatory Visit: Payer: Self-pay

## 2018-02-02 MED ORDER — ONDANSETRON HCL 8 MG PO TABS: 8.0000 mg | ORAL_TABLET | Freq: Two times a day (BID) | ORAL | 1 refills | Status: DC | PRN

## 2018-02-02 MED ORDER — LIDOCAINE-PRILOCAINE 2.5-2.5 % EX CREA: TOPICAL_CREAM | CUTANEOUS | 3 refills | Status: DC

## 2018-02-02 MED ORDER — PROCHLORPERAZINE MALEATE 10 MG PO TABS: 10.0000 mg | ORAL_TABLET | Freq: Four times a day (QID) | ORAL | 1 refills | Status: DC | PRN

## 2018-02-02 NOTE — Progress Notes (Signed)
START ON PATHWAY REGIMEN - Colorectal     A cycle is every 14 days:     Oxaliplatin      Leucovorin      5-Fluorouracil      5-Fluorouracil   **Always confirm dose/schedule in your pharmacy ordering system**  Patient Characteristics: Preoperative or Nonsurgical Candidate (Clinical Staging), Rectal, cT3 - cT4, cN0 or Any cT, cN+ Therapeutic Status: Preoperative or Nonsurgical Candidate (Clinical Staging) Tumor Location: Rectal AJCC T Category: cT3 AJCC N Category: cN0 AJCC M Category: cM0 AJCC 8 Stage Grouping: IIA Intent of Therapy: Curative Intent, Discussed with Patient 

## 2018-02-02 NOTE — Telephone Encounter (Signed)
Mr. Escandon came into the cancer center today. He was educated further on treatment plan per Dr. Tasia Catchings. He will receive TNT for his rectal cancer. He will start with FOLFOX for 6-8 cycles, followed by chemo/xrt, followed by surgery. Dr. Lysle Pearl will place port 10/29. We went over FOLFOX and administration schedule. We will arrange for chemo class and treatment start. He will be called with these appointments. Oncology Nurse Navigator Documentation  Navigator Location: [facility] (02/02/18 1200)   )Navigator Encounter Type: Clinic/[facility] (02/02/18 1200) Telephone: Education (02/02/18 1200)                                                  Time Spent with Patient: 30 (02/02/18 1200)

## 2018-02-02 NOTE — Telephone Encounter (Signed)
Notified Dr. Ines Bloomer office to discuss port placement. Voicemail left with Mr. Dolata to return call to discuss treatment plan after his appointment this morning with Dr. Lysle Pearl. Oncology Nurse Navigator Documentation  Navigator Location: [facility] (02/02/18 0900)   )Navigator Encounter Type: Telephone (02/02/18 0900) Telephone: Lahoma Crocker Call (02/02/18 0900)                                                  Time Spent with Patient: 15 (02/02/18 0900)

## 2018-02-02 NOTE — Telephone Encounter (Signed)
error 

## 2018-02-02 NOTE — H&P (Signed)
Subjective:  CC: Rectal cancer (CMS-HCC) [C20]   HPI:  Patient here today from follow-up of recently diagnosed rectal cancer status post biopsy via colonoscopy by myself.  He has been seeing the colorectal surgeon as well as oncology radiation oncology and the current plan is to start with neoadjuvant chemoradiation and then likely adjuvant radiation and chemo afterwards depending on his response.  Here today to discuss port placement as well as discussion of drainage of a possible perineal abscess.  Next  Patient states the perineal abscess has been around for quite some time and has occasionally noted some drainage in the area.  Currently denies any drainage or extreme tenderness but does still feel persistent firmness in the area.  He has recommended by the oncology team to have this addressed prior to initiating any chemotherapy and/or radiation in the area.  Past Medical History: none reported  Past Surgical History:  has a past surgical history that includes Colonoscopy (06/24/2013).  Family History: reviewed and non-contributory  Social History:  reports that he has been smoking. He has never used smokeless tobacco. He reports current alcohol use. He reports that he does not use drugs.  Current Medications: has a current medication list which includes the following prescription(s): polyethylene glycol, hydrocodone-acetaminophen, and lidocaine.  Allergies:     Allergies as of 02/02/2018  . (No Known Allergies)    ROS:  A 15 point review of systems was performed and pertinent positives and negatives noted in HPI  Objective:   BP (!) 142/91   Pulse 82   Temp 36.7 C (98 F) (Oral)   Ht 179.1 cm (5' 10.5")   Wt 84.8 kg (186 lb 15.2 oz)   BMI 26.45 kg/m   Constitutional :  alert, appears stated age, cooperative and no distress  Lymphatics/Throat::  no asymmetry, masses, or scars  Respiratory:  clear to auscultation bilaterally  Cardiovascular:  regular rate and  rhythm  Gastrointestinal: soft, non-tender; bowel sounds normal; no masses,  no organomegaly.    Musculoskeletal: Steady gait and movement  Skin: Cool and moist,   Psychiatric: Normal affect, non-agitated, not confused  Genital: Chaperone present for exam.  Left perineal region slightly posterior and lateral to the scrotum there is an area of fullness measuring approximately 3 cm long and 1-1/2 cm wide, with minimal erythema, no obvious discharge, nontender to palpation.  There is signs of chronic skin changes surrounding the entire perineum.        LABS:  n/a  RADS: In clinic ultrasound performed by myself showed a roughly 1 cm x 1 cm hypoechoic, non-homologous lesion at the very superficial layer of the skin in the indurated area consistent with likely abscess  Assessment:    perineal abscess Rectal cancer (CMS-HCC) [C20]  Plan:  1. Rectal cancer (CMS-HCC) [C20]  Risk alternative benefits discussed.  Risks include bleeding, infection, dislocation, migration, malfunction, unsuccessful placement, pneumothorax, and additional procedures to address that risks.  Benefits include initiation of chemotherapy.  Alternative includes non-IV infusion therapy.  We discussed the need for the port to infuse IV chemotherapy agents, and how the port can be a temporary and/or permanent option depending on patient preference after completion of chemotherapy.  Port will be okay to use as soon as it is placed.  Patient verbalized understanding and all questions and concerns addressed.

## 2018-02-02 NOTE — H&P (View-Only) (Signed)
Subjective:  CC: Rectal cancer (CMS-HCC) [C20]   HPI:  Patient here today from follow-up of recently diagnosed rectal cancer status post biopsy via colonoscopy by myself.  He has been seeing the colorectal surgeon as well as oncology radiation oncology and the current plan is to start with neoadjuvant chemoradiation and then likely adjuvant radiation and chemo afterwards depending on his response.  Here today to discuss port placement as well as discussion of drainage of a possible perineal abscess.  Next  Patient states the perineal abscess has been around for quite some time and has occasionally noted some drainage in the area.  Currently denies any drainage or extreme tenderness but does still feel persistent firmness in the area.  He has recommended by the oncology team to have this addressed prior to initiating any chemotherapy and/or radiation in the area.  Past Medical History: none reported  Past Surgical History:  has a past surgical history that includes Colonoscopy (06/24/2013).  Family History: reviewed and non-contributory  Social History:  reports that he has been smoking. He has never used smokeless tobacco. He reports current alcohol use. He reports that he does not use drugs.  Current Medications: has a current medication list which includes the following prescription(s): polyethylene glycol, hydrocodone-acetaminophen, and lidocaine.  Allergies:     Allergies as of 02/02/2018  . (No Known Allergies)    ROS:  A 15 point review of systems was performed and pertinent positives and negatives noted in HPI  Objective:   BP (!) 142/91   Pulse 82   Temp 36.7 C (98 F) (Oral)   Ht 179.1 cm (5' 10.5")   Wt 84.8 kg (186 lb 15.2 oz)   BMI 26.45 kg/m   Constitutional :  alert, appears stated age, cooperative and no distress  Lymphatics/Throat::  no asymmetry, masses, or scars  Respiratory:  clear to auscultation bilaterally  Cardiovascular:  regular rate and  rhythm  Gastrointestinal: soft, non-tender; bowel sounds normal; no masses,  no organomegaly.    Musculoskeletal: Steady gait and movement  Skin: Cool and moist,   Psychiatric: Normal affect, non-agitated, not confused  Genital: Chaperone present for exam.  Left perineal region slightly posterior and lateral to the scrotum there is an area of fullness measuring approximately 3 cm long and 1-1/2 cm wide, with minimal erythema, no obvious discharge, nontender to palpation.  There is signs of chronic skin changes surrounding the entire perineum.        LABS:  n/a  RADS: In clinic ultrasound performed by myself showed a roughly 1 cm x 1 cm hypoechoic, non-homologous lesion at the very superficial layer of the skin in the indurated area consistent with likely abscess  Assessment:    perineal abscess Rectal cancer (CMS-HCC) [C20]  Plan:  1. Rectal cancer (CMS-HCC) [C20]  Risk alternative benefits discussed.  Risks include bleeding, infection, dislocation, migration, malfunction, unsuccessful placement, pneumothorax, and additional procedures to address that risks.  Benefits include initiation of chemotherapy.  Alternative includes non-IV infusion therapy.  We discussed the need for the port to infuse IV chemotherapy agents, and how the port can be a temporary and/or permanent option depending on patient preference after completion of chemotherapy.  Port will be okay to use as soon as it is placed.  Patient verbalized understanding and all questions and concerns addressed.

## 2018-02-03 ENCOUNTER — Ambulatory Visit: Payer: Self-pay

## 2018-02-03 ENCOUNTER — Ambulatory Visit: Payer: Self-pay | Admitting: Anesthesiology

## 2018-02-03 ENCOUNTER — Telehealth: Payer: Self-pay

## 2018-02-03 ENCOUNTER — Ambulatory Visit
Admission: RE | Admit: 2018-02-03 | Discharge: 2018-02-03 | Disposition: A | Discharge status: Home / Self Care | Payer: Self-pay | Source: Ambulatory Visit | Attending: Surgery | Admitting: Surgery

## 2018-02-03 ENCOUNTER — Encounter: Payer: Self-pay | Admitting: *Deleted

## 2018-02-03 ENCOUNTER — Encounter
Admission: RE | Disposition: A | Discharge status: Home / Self Care | Payer: Self-pay | Source: Ambulatory Visit | Attending: Surgery

## 2018-02-03 DIAGNOSIS — F17200 Nicotine dependence, unspecified, uncomplicated: Secondary | ICD-10-CM | POA: Insufficient documentation

## 2018-02-03 DIAGNOSIS — L02215 Cutaneous abscess of perineum: Secondary | ICD-10-CM | POA: Insufficient documentation

## 2018-02-03 DIAGNOSIS — C20 Malignant neoplasm of rectum: Secondary | ICD-10-CM | POA: Insufficient documentation

## 2018-02-03 DIAGNOSIS — Z95828 Presence of other vascular implants and grafts: Secondary | ICD-10-CM

## 2018-02-03 HISTORY — PX: PORTACATH PLACEMENT: SHX2246

## 2018-02-03 SURGERY — INSERTION, TUNNELED CENTRAL VENOUS DEVICE, WITH PORT
Anesthesia: General | Site: Chest | Laterality: Right

## 2018-02-03 MED ORDER — PROPOFOL 10 MG/ML IV BOLUS
INTRAVENOUS | Status: DC | PRN
  Administered 2018-02-03: 20 mg via INTRAVENOUS

## 2018-02-03 MED ORDER — PROPOFOL 500 MG/50ML IV EMUL
INTRAVENOUS | Status: AC
  Filled 2018-02-03: qty 50

## 2018-02-03 MED ORDER — SODIUM CHLORIDE 0.9 % IJ SOLN
INTRAMUSCULAR | Status: AC
  Filled 2018-02-03: qty 50

## 2018-02-03 MED ORDER — MIDAZOLAM HCL 2 MG/2ML IJ SOLN
INTRAMUSCULAR | Status: DC | PRN
  Administered 2018-02-03: 2 mg via INTRAVENOUS

## 2018-02-03 MED ORDER — BUPIVACAINE HCL (PF) 0.5 % IJ SOLN
INTRAMUSCULAR | Status: AC
  Filled 2018-02-03: qty 30

## 2018-02-03 MED ORDER — MIDAZOLAM HCL 2 MG/2ML IJ SOLN
INTRAMUSCULAR | Status: AC
  Filled 2018-02-03: qty 2

## 2018-02-03 MED ORDER — MEPERIDINE HCL 50 MG/ML IJ SOLN: 6.2500 mg | INTRAMUSCULAR | Status: DC | PRN

## 2018-02-03 MED ORDER — HYDROCODONE-ACETAMINOPHEN 5-325 MG PO TABS: 1.0000 | ORAL_TABLET | Freq: Four times a day (QID) | ORAL | 0 refills | Status: DC | PRN

## 2018-02-03 MED ORDER — PROPOFOL 500 MG/50ML IV EMUL
INTRAVENOUS | Status: DC | PRN
  Administered 2018-02-03: 150 ug/kg/min via INTRAVENOUS

## 2018-02-03 MED ORDER — NYSTATIN 100000 UNIT/GM EX POWD: Freq: Four times a day (QID) | CUTANEOUS | 0 refills | Status: DC

## 2018-02-03 MED ORDER — PROMETHAZINE HCL 25 MG/ML IJ SOLN: 6.2500 mg | INTRAMUSCULAR | Status: DC | PRN

## 2018-02-03 MED ORDER — HYDROCODONE-ACETAMINOPHEN 5-325 MG PO TABS
1.0000 | ORAL_TABLET | Freq: Four times a day (QID) | ORAL | Status: DC | PRN
  Administered 2018-02-03: 1 via ORAL

## 2018-02-03 MED ORDER — LIDOCAINE HCL (PF) 1 % IJ SOLN
INTRAMUSCULAR | Status: AC
  Filled 2018-02-03: qty 30

## 2018-02-03 MED ORDER — OXYCODONE HCL 5 MG/5ML PO SOLN: 5.0000 mg | Freq: Once | ORAL | Status: DC | PRN

## 2018-02-03 MED ORDER — LACTATED RINGERS IV SOLN
INTRAVENOUS | Status: DC
  Administered 2018-02-03 (×2): via INTRAVENOUS

## 2018-02-03 MED ORDER — CHLORHEXIDINE GLUCONATE CLOTH 2 % EX PADS
6.0000 | MEDICATED_PAD | Freq: Once | CUTANEOUS | Status: AC
  Administered 2018-02-03: 6 via TOPICAL

## 2018-02-03 MED ORDER — IBUPROFEN 800 MG PO TABS: 800.0000 mg | ORAL_TABLET | Freq: Three times a day (TID) | ORAL | 0 refills | Status: DC | PRN

## 2018-02-03 MED ORDER — OXYCODONE HCL 5 MG PO TABS: 5.0000 mg | ORAL_TABLET | Freq: Once | ORAL | Status: DC | PRN

## 2018-02-03 MED ORDER — CEFAZOLIN SODIUM-DEXTROSE 2-4 GM/100ML-% IV SOLN
2.0000 g | INTRAVENOUS | Status: AC
  Administered 2018-02-03: 2 g via INTRAVENOUS

## 2018-02-03 MED ORDER — HEPARIN SOD (PORK) LOCK FLUSH 100 UNIT/ML IV SOLN
INTRAVENOUS | Status: AC
  Filled 2018-02-03: qty 5

## 2018-02-03 MED ORDER — FENTANYL CITRATE (PF) 100 MCG/2ML IJ SOLN: 25.0000 ug | INTRAMUSCULAR | Status: DC | PRN

## 2018-02-03 MED ORDER — CEFAZOLIN SODIUM-DEXTROSE 2-4 GM/100ML-% IV SOLN
INTRAVENOUS | Status: AC
  Filled 2018-02-03: qty 100

## 2018-02-03 MED ORDER — ACETAMINOPHEN 325 MG PO TABS: 650.0000 mg | ORAL_TABLET | Freq: Three times a day (TID) | ORAL | 0 refills | Status: AC | PRN

## 2018-02-03 MED ORDER — HYDROCODONE-ACETAMINOPHEN 5-325 MG PO TABS
ORAL_TABLET | ORAL | Status: AC
  Filled 2018-02-03: qty 1

## 2018-02-03 MED ORDER — LIDOCAINE HCL 1 % IJ SOLN
INTRAMUSCULAR | Status: DC | PRN
  Administered 2018-02-03: 12 mL via INTRAMUSCULAR

## 2018-02-03 SURGICAL SUPPLY — 30 items
BLADE SURG 11 STRL SS SAFETY (MISCELLANEOUS) ×3 IMPLANT
CANISTER SUCT 1200ML W/VALVE (MISCELLANEOUS) ×3 IMPLANT
CHLORAPREP W/TINT 26ML (MISCELLANEOUS) ×3 IMPLANT
COVER LIGHT HANDLE STERIS (MISCELLANEOUS) ×6 IMPLANT
COVER WAND RF STERILE (DRAPES) ×3 IMPLANT
DECANTER SPIKE VIAL GLASS SM (MISCELLANEOUS) ×3 IMPLANT
DERMABOND ADVANCED (GAUZE/BANDAGES/DRESSINGS) ×2
DERMABOND ADVANCED .7 DNX12 (GAUZE/BANDAGES/DRESSINGS) ×1 IMPLANT
DRAPE C-ARM XRAY 36X54 (DRAPES) ×3 IMPLANT
DRSG TEGADERM 2-3/8X2-3/4 SM (GAUZE/BANDAGES/DRESSINGS) ×3 IMPLANT
DRSG TEGADERM 4X4.75 (GAUZE/BANDAGES/DRESSINGS) ×3 IMPLANT
ELECT REM PT RETURN 9FT ADLT (ELECTROSURGICAL) ×3
ELECTRODE REM PT RTRN 9FT ADLT (ELECTROSURGICAL) ×1 IMPLANT
GAUZE 4X4 16PLY RFD (DISPOSABLE) ×3 IMPLANT
GLOVE BIOGEL PI IND STRL 7.0 (GLOVE) ×1 IMPLANT
GLOVE BIOGEL PI INDICATOR 7.0 (GLOVE) ×2
GLOVE SURG SYN 7.0 (GLOVE) ×3 IMPLANT
GOWN STRL REUS W/ TWL LRG LVL3 (GOWN DISPOSABLE) ×3 IMPLANT
GOWN STRL REUS W/TWL LRG LVL3 (GOWN DISPOSABLE) ×6
KIT PORT POWER 8FR ISP CVUE (Port) ×3 IMPLANT
KIT TURNOVER KIT A (KITS) ×3 IMPLANT
LABEL OR SOLS (LABEL) ×3 IMPLANT
PACK PORT-A-CATH (MISCELLANEOUS) ×3 IMPLANT
PAD TELFA 2X3 NADH STRL (GAUZE/BANDAGES/DRESSINGS) ×3 IMPLANT
SUT MNCRL AB 4-0 PS2 18 (SUTURE) ×3 IMPLANT
SUT VIC AB 2-0 SH 27 (SUTURE) ×2
SUT VIC AB 2-0 SH 27XBRD (SUTURE) ×1 IMPLANT
SUT VIC AB 3-0 SH 27 (SUTURE) ×2
SUT VIC AB 3-0 SH 27X BRD (SUTURE) ×1 IMPLANT
SYR 10ML LL (SYRINGE) ×3 IMPLANT

## 2018-02-03 NOTE — Op Note (Addendum)
OP NOTE  DATE OF PROCEDURE: 02/03/2018   SURGEON: Lysle Pearl  ANESTHESIA: LMA  PRE-OPERATIVE DIAGNOSIS: rectal cancer requring port for chemotherapy   POST-OPERATIVE DIAGNOSIS: same  PROCEDURE(S): (cpt: 24825) 1.) Percutaneous access of right IJ vein under ultrasound guidance   2.) Insertion of tunneled righ IJ central venous catheter with subcutaneous port under fluoroscopic guidance   INTRAOPERATIVE FINDINGS: Patent easily compressible right IJ vein with appropriate respiratory variations and well-secured tunneled central venous catheter with subcutaneous port at completion of the procedure, heplocked after confirming ease of draw and push.  ESTIMATED BLOOD LOSS: Minimal (<20 mL)   SPECIMENS: None   IMPLANTS: 81F tunneled Bard PowerPort central venous catheter with subcutaneous port  DRAINS: None   COMPLICATIONS: None apparent   CONDITION AT COMPLETION: Hemodynamically stable, awake   DISPOSITION: PACU   INDICATION(S) FOR PROCEDURE:  Patient is a 52 y.o. male who presented with above diagnosis.  All risks, benefits, and alternatives to above elective procedures were discussed with the patient, who elected to proceed, and informed consent was accordingly obtained at that time.  DETAILS OF PROCEDURE:  Patient was brought to the operative suite and appropriately identified. In Trendelenburg position, right IJ venous access site was prepped and draped in the usual sterile fashion, and following a timeout, percutaneous venous access was obtained under ultrasound guidance using Seldinger technique, by which local anesthetic was injected over the area, and access needle was inserted under direct ultrasound visualization through which soft guidewire was advanced with no resistance, over which access needle was withdrawn. Guidewire was secured, attention was directed to injection of local anesthetic along the planned tunnel site, 2-3 cm transverse right chest incision was made and confirmed  to accommodate the subcutaneous port, and flushed catheter was tunneled retrograde from the port site over to the vein access site. Insertion sheath was advanced over the guidewire, which was withdrawn along with the insertion sheath dilator with no resistance. The catheter was introduced through the sheath and tip left in the [place]rio [place] junction confirmed under fluoro guidance and catheter cut to appropriate length.  Catheter connected to port and placed within planned fixation site. Port fixed to the pocket on two side to avoid twisting. Port was confirmed to withdraw blood and flush easily with included Heuber needle, and port heplocked. Dermis at the subcutaneous pocket was re-approximated using buried interrupted 3-0 Vicryl suture, and 4-0 Monocryl suture was used to re-approximate skin at the insertion and subcutaneous port sites in running subcuticular fashion.  Incisions then dressed with steristrips, 2x2, and tegaderm. Patient was then awakened from anesthesia and transferred to PACU in stable condition.  [place] images saved in paper chart and fluoro images saved in Epic  CXR post op confirmed proper placement of port and no evidence of pneumothorax.

## 2018-02-03 NOTE — Discharge Instructions (Signed)

## 2018-02-03 NOTE — Anesthesia Procedure Notes (Signed)
Date/Time: 02/03/2018 7:45 AM Performed by: Nelda Marseille, CRNA Pre-anesthesia Checklist: Patient identified, Emergency Drugs available, Suction available, Patient being monitored and Timeout performed Oxygen Delivery Method: Simple face mask

## 2018-02-03 NOTE — Anesthesia Preprocedure Evaluation (Signed)
Anesthesia Evaluation  Patient identified by MRN, date of birth, ID band Patient awake    Reviewed: Allergy & Precautions, NPO status , Patient's Chart, lab work & pertinent test results  History of Anesthesia Complications Negative for: history of anesthetic complications  Airway Mallampati: II  TM Distance: >3 FB Neck ROM: Full    Dental  (+) Poor Dentition   Pulmonary neg sleep apnea, neg COPD, Current Smoker,    breath sounds clear to auscultation- rhonchi (-) wheezing      Cardiovascular Exercise Tolerance: Good (-) hypertension(-) CAD, (-) Past MI, (-) Cardiac Stents and (-) CABG  Rhythm:Regular Rate:Normal - Systolic murmurs and - Diastolic murmurs    Neuro/Psych negative neurological ROS  negative psych ROS   GI/Hepatic negative GI ROS, Neg liver ROS,   Endo/Other  negative endocrine ROSneg diabetes  Renal/GU negative Renal ROS     Musculoskeletal negative musculoskeletal ROS (+)   Abdominal (+) - obese,   Peds  Hematology negative hematology ROS (+)   Anesthesia Other Findings Past Medical History: No date: Hemorrhoid prolapse   Reproductive/Obstetrics                             Anesthesia Physical Anesthesia Plan  ASA: II  Anesthesia Plan: General   Post-op Pain Management:    Induction: Intravenous  PONV Risk Score and Plan: 0 and Propofol infusion  Airway Management Planned: Natural Airway  Additional Equipment:   Intra-op Plan:   Post-operative Plan:   Informed Consent: I have reviewed the patients History and Physical, chart, labs and discussed the procedure including the risks, benefits and alternatives for the proposed anesthesia with the patient or authorized representative who has indicated his/her understanding and acceptance.   Dental advisory given  Plan Discussed with: CRNA and Anesthesiologist  Anesthesia Plan Comments:         Anesthesia  Quick Evaluation

## 2018-02-03 NOTE — Anesthesia Post-op Follow-up Note (Signed)
Anesthesia QCDR form completed.        

## 2018-02-03 NOTE — Interval H&P Note (Signed)
History and Physical Interval Note:  02/03/2018 7:07 AM  Jeffery Mccormick  has presented today for surgery, with the diagnosis of C20  The various methods of treatment have been discussed with the patient and family. After consideration of risks, benefits and other options for treatment, the patient has consented to  Procedure(s): INSERTION PORT-A-CATH (N/A) as a surgical intervention .  The patient's history has been reviewed, patient examined, no change in status, stable for surgery.  I have reviewed the patient's chart and labs.  Questions were answered to the patient's satisfaction.     Jeffery Mccormick

## 2018-02-03 NOTE — Telephone Encounter (Signed)
Voicemail left with Mr. Lazard with appointment details for chemo class/chemo start. Asked to return call to confirm. Oncology Nurse Navigator Documentation  Navigator Location: [facility] (02/03/18 1200)   )Navigator Encounter Type: Telephone (02/03/18 1200) Telephone: Lahoma Crocker Call;Appt Confirmation/Clarification (02/03/18 1200)                                                  Time Spent with Patient: 15 (02/03/18 1200)

## 2018-02-03 NOTE — Anesthesia Postprocedure Evaluation (Signed)
Anesthesia Post Note  Patient: Jeffery Mccormick  Procedure(s) Performed: INSERTION PORT-A-CATH (Right Chest)  Patient location during evaluation: PACU Anesthesia Type: General Level of consciousness: awake and alert and oriented Pain management: pain level controlled Vital Signs Assessment: post-procedure vital signs reviewed and stable Respiratory status: spontaneous breathing, nonlabored ventilation and respiratory function stable Cardiovascular status: blood pressure returned to baseline and stable Postop Assessment: no signs of nausea or vomiting Anesthetic complications: no     Last Vitals:  Vitals:   02/03/18 0945 02/03/18 1022  BP: (!) 146/77 138/77  Pulse: 69 66  Resp: 16 16  Temp: (!) 36.2 C   SpO2: 98% 100%    Last Pain:  Vitals:   02/03/18 0945  TempSrc:   PainSc: 0-No pain                 Teriyah Purington

## 2018-02-03 NOTE — Transfer of Care (Signed)
Immediate Anesthesia Transfer of Care Note  Patient: Jeffery Mccormick  Procedure(s) Performed: INSERTION PORT-A-CATH (Right Chest)  Patient Location: PACU  Anesthesia Type:General  Level of Consciousness: awake, alert  and oriented  Airway & Oxygen Therapy: Patient Spontanous Breathing and Patient connected to face mask oxygen  Post-op Assessment: Report given to RN and Post -op Vital signs reviewed and stable  Post vital signs: Reviewed and stable  Last Vitals:  Vitals Value Taken Time  BP    Temp    Pulse 87 02/03/2018  9:10 AM  Resp 13 02/03/2018  9:10 AM  SpO2 99 % 02/03/2018  9:10 AM  Vitals shown include unvalidated device data.  Last Pain:  Vitals:   02/03/18 0612  TempSrc: Tympanic  PainSc: 0-No pain         Complications: No apparent anesthesia complications

## 2018-02-05 ENCOUNTER — Encounter: Payer: Self-pay | Admitting: *Deleted

## 2018-02-05 DIAGNOSIS — C20 Malignant neoplasm of rectum: Secondary | ICD-10-CM

## 2018-02-05 NOTE — Patient Instructions (Signed)

## 2018-02-06 ENCOUNTER — Encounter: Payer: Self-pay | Admitting: Oncology

## 2018-02-06 ENCOUNTER — Inpatient Hospital Stay: Payer: Self-pay

## 2018-02-06 ENCOUNTER — Inpatient Hospital Stay: Payer: Self-pay | Attending: Oncology

## 2018-02-06 DIAGNOSIS — Z7189 Other specified counseling: Secondary | ICD-10-CM | POA: Insufficient documentation

## 2018-02-06 DIAGNOSIS — K6289 Other specified diseases of anus and rectum: Secondary | ICD-10-CM | POA: Insufficient documentation

## 2018-02-06 DIAGNOSIS — C20 Malignant neoplasm of rectum: Secondary | ICD-10-CM | POA: Insufficient documentation

## 2018-02-06 DIAGNOSIS — Z23 Encounter for immunization: Secondary | ICD-10-CM | POA: Insufficient documentation

## 2018-02-06 DIAGNOSIS — K625 Hemorrhage of anus and rectum: Secondary | ICD-10-CM | POA: Insufficient documentation

## 2018-02-06 DIAGNOSIS — F17210 Nicotine dependence, cigarettes, uncomplicated: Secondary | ICD-10-CM | POA: Insufficient documentation

## 2018-02-06 DIAGNOSIS — Z5111 Encounter for antineoplastic chemotherapy: Secondary | ICD-10-CM | POA: Insufficient documentation

## 2018-02-06 LAB — CBC WITH DIFFERENTIAL/PLATELET
ABS IMMATURE GRANULOCYTES: 0.01 10*3/uL (ref 0.00–0.07)
BASOS ABS: 0.1 10*3/uL (ref 0.0–0.1)
Basophils Relative: 1 %
Eosinophils Absolute: 0.2 10*3/uL (ref 0.0–0.5)
Eosinophils Relative: 2 %
HEMATOCRIT: 48.3 % (ref 39.0–52.0)
Hemoglobin: 16.7 g/dL (ref 13.0–17.0)
Immature Granulocytes: 0 %
Lymphocytes Relative: 28 %
Lymphs Abs: 2.5 10*3/uL (ref 0.7–4.0)
MCH: 32.7 pg (ref 26.0–34.0)
MCHC: 34.6 g/dL (ref 30.0–36.0)
MCV: 94.7 fL (ref 80.0–100.0)
MONOS PCT: 8 %
Monocytes Absolute: 0.7 10*3/uL (ref 0.1–1.0)
NEUTROS PCT: 61 %
NRBC: 0 % (ref 0.0–0.2)
Neutro Abs: 5.6 10*3/uL (ref 1.7–7.7)
Platelets: 229 10*3/uL (ref 150–400)
RBC: 5.1 MIL/uL (ref 4.22–5.81)
RDW: 12.8 % (ref 11.5–15.5)
WBC: 9.1 10*3/uL (ref 4.0–10.5)

## 2018-02-06 LAB — COMPREHENSIVE METABOLIC PANEL
ALT: 26 U/L (ref 0–44)
ANION GAP: 10 (ref 5–15)
AST: 21 U/L (ref 15–41)
Albumin: 4.7 g/dL (ref 3.5–5.0)
Alkaline Phosphatase: 89 U/L (ref 38–126)
BUN: 10 mg/dL (ref 6–20)
CALCIUM: 9.9 mg/dL (ref 8.9–10.3)
CO2: 24 mmol/L (ref 22–32)
Chloride: 103 mmol/L (ref 98–111)
Creatinine, Ser: 0.87 mg/dL (ref 0.61–1.24)
GFR calc non Af Amer: >60 mL/min (ref >60)
GLUCOSE: 105 mg/dL — AB (ref 70–99)
Potassium: 3.9 mmol/L (ref 3.5–5.1)
SODIUM: 137 mmol/L (ref 135–145)
Total Bilirubin: 0.6 mg/dL (ref 0.3–1.2)
Total Protein: 8.4 g/dL — ABNORMAL HIGH (ref 6.5–8.1)

## 2018-02-06 NOTE — Progress Notes (Signed)
Met with Jeffery Mccormick and his spouse, Jeffery Mccormick, along with Jack Crater, PSN, after chemo class. He had to stop working in April this year due to his rectal cancer and does not have insurance. We discussed options for applying for SS disability. He was instructed to call [facility] and apply for financial assistance. He was given options for assistance through charitable foundation as well. Oncology Nurse Navigator Documentation  Navigator Location: [facility] (02/06/18 1400)   )Navigator Encounter Type: Other (02/06/18 1400) Telephone: Financial Assistance;Medication Assistance (02/06/18 1400)                                                  Time Spent with Patient: 30 (02/06/18 1400)    

## 2018-02-09 ENCOUNTER — Inpatient Hospital Stay: Payer: Self-pay

## 2018-02-09 ENCOUNTER — Encounter: Payer: Self-pay | Admitting: Oncology

## 2018-02-09 ENCOUNTER — Other Ambulatory Visit: Payer: Self-pay

## 2018-02-09 ENCOUNTER — Inpatient Hospital Stay (HOSPITAL_BASED_OUTPATIENT_CLINIC_OR_DEPARTMENT_OTHER): Payer: Self-pay | Admitting: Oncology

## 2018-02-09 VITALS — BP 136/87 | HR 72 | Temp 96.2°F | Resp 18 | Wt 184.1 lb

## 2018-02-09 DIAGNOSIS — Z5111 Encounter for antineoplastic chemotherapy: Secondary | ICD-10-CM

## 2018-02-09 DIAGNOSIS — C20 Malignant neoplasm of rectum: Secondary | ICD-10-CM

## 2018-02-09 DIAGNOSIS — Z23 Encounter for immunization: Secondary | ICD-10-CM

## 2018-02-09 DIAGNOSIS — F17210 Nicotine dependence, cigarettes, uncomplicated: Secondary | ICD-10-CM

## 2018-02-09 LAB — COMPREHENSIVE METABOLIC PANEL
ALBUMIN: 4.1 g/dL (ref 3.5–5.0)
ALK PHOS: 80 U/L (ref 38–126)
ALT: 23 U/L (ref 0–44)
AST: 22 U/L (ref 15–41)
Anion gap: 7 (ref 5–15)
BILIRUBIN TOTAL: 0.6 mg/dL (ref 0.3–1.2)
BUN: 9 mg/dL (ref 6–20)
CALCIUM: 9 mg/dL (ref 8.9–10.3)
CO2: 24 mmol/L (ref 22–32)
Chloride: 105 mmol/L (ref 98–111)
Creatinine, Ser: 0.79 mg/dL (ref 0.61–1.24)
GFR calc Af Amer: >60 mL/min (ref >60)
GLUCOSE: 133 mg/dL — AB (ref 70–99)
Potassium: 4 mmol/L (ref 3.5–5.1)
Sodium: 136 mmol/L (ref 135–145)
TOTAL PROTEIN: 7.4 g/dL (ref 6.5–8.1)

## 2018-02-09 LAB — CBC WITH DIFFERENTIAL/PLATELET
Abs Immature Granulocytes: 0.01 10*3/uL (ref 0.00–0.07)
BASOS PCT: 1 %
Basophils Absolute: 0 10*3/uL (ref 0.0–0.1)
EOS ABS: 0.1 10*3/uL (ref 0.0–0.5)
Eosinophils Relative: 1 %
HCT: 45.1 % (ref 39.0–52.0)
Hemoglobin: 15.8 g/dL (ref 13.0–17.0)
Immature Granulocytes: 0 %
Lymphocytes Relative: 24 %
Lymphs Abs: 2 10*3/uL (ref 0.7–4.0)
MCH: 33 pg (ref 26.0–34.0)
MCHC: 35 g/dL (ref 30.0–36.0)
MCV: 94.2 fL (ref 80.0–100.0)
MONO ABS: 0.7 10*3/uL (ref 0.1–1.0)
Monocytes Relative: 8 %
NEUTROS PCT: 66 %
Neutro Abs: 5.5 10*3/uL (ref 1.7–7.7)
PLATELETS: 228 10*3/uL (ref 150–400)
RBC: 4.79 MIL/uL (ref 4.22–5.81)
RDW: 12.8 % (ref 11.5–15.5)
WBC: 8.4 10*3/uL (ref 4.0–10.5)
nRBC: 0 % (ref 0.0–0.2)

## 2018-02-09 MED ORDER — SODIUM CHLORIDE 0.9 % IV SOLN
2400.0000 mg/m2 | INTRAVENOUS | Status: DC
  Administered 2018-02-09: 4900 mg via INTRAVENOUS
  Filled 2018-02-09: qty 98

## 2018-02-09 MED ORDER — OXALIPLATIN CHEMO INJECTION 100 MG/20ML
85.0000 mg/m2 | Freq: Once | INTRAVENOUS | Status: AC
  Administered 2018-02-09: 175 mg via INTRAVENOUS
  Filled 2018-02-09: qty 35

## 2018-02-09 MED ORDER — SODIUM CHLORIDE 0.9% FLUSH
10.0000 mL | Freq: Once | INTRAVENOUS | Status: AC
  Administered 2018-02-09: 10 mL via INTRAVENOUS
  Filled 2018-02-09: qty 10

## 2018-02-09 MED ORDER — HEPARIN SOD (PORK) LOCK FLUSH 100 UNIT/ML IV SOLN: 500.0000 [IU] | Freq: Once | INTRAVENOUS | Status: DC

## 2018-02-09 MED ORDER — DEXTROSE 5 % IV SOLN
Freq: Once | INTRAVENOUS | Status: AC
  Administered 2018-02-09: 10:00:00 via INTRAVENOUS
  Filled 2018-02-09: qty 250

## 2018-02-09 MED ORDER — FLUOROURACIL CHEMO INJECTION 2.5 GM/50ML
400.0000 mg/m2 | Freq: Once | INTRAVENOUS | Status: AC
  Administered 2018-02-09: 800 mg via INTRAVENOUS
  Filled 2018-02-09: qty 16

## 2018-02-09 MED ORDER — INFLUENZA VAC SPLIT HIGH-DOSE 0.5 ML IM SUSY
0.5000 mL | PREFILLED_SYRINGE | Freq: Once | INTRAMUSCULAR | Status: AC
  Administered 2018-02-09: 0.5 mL via INTRAMUSCULAR
  Filled 2018-02-09: qty 0.5

## 2018-02-09 MED ORDER — SODIUM CHLORIDE 0.9 % IV SOLN: 10.0000 mg | Freq: Once | INTRAVENOUS | Status: DC

## 2018-02-09 MED ORDER — PALONOSETRON HCL INJECTION 0.25 MG/5ML
0.2500 mg | Freq: Once | INTRAVENOUS | Status: AC
  Administered 2018-02-09: 0.25 mg via INTRAVENOUS
  Filled 2018-02-09: qty 5

## 2018-02-09 MED ORDER — LEUCOVORIN CALCIUM INJECTION 350 MG
800.0000 mg | Freq: Once | INTRAVENOUS | Status: AC
  Administered 2018-02-09: 800 mg via INTRAVENOUS
  Filled 2018-02-09: qty 25

## 2018-02-09 MED ORDER — DEXAMETHASONE SODIUM PHOSPHATE 10 MG/ML IJ SOLN
10.0000 mg | Freq: Once | INTRAMUSCULAR | Status: AC
  Administered 2018-02-09: 10 mg via INTRAVENOUS
  Filled 2018-02-09: qty 1

## 2018-02-09 NOTE — Progress Notes (Addendum)
Hematology/Oncology Consult note [facility] [phone] [phone]   Patient Care Team: Katheren Shams as PCP - General [name], RN as Registered Nurse  REFERRING PROVIDER: Dr.Thomas CHIEF COMPLAINTS/REASON FOR VISIT:  Evaluation of rectal cancer  HISTORY OF PRESENTING ILLNESS:  Jeffery Mccormick is a  52 y.o.  male with PMH listed below who was referred to me for evaluation of rectal cancer.   He was evaluated by surgeon for evaluation of chronic rectal bleeding, ongoing chronic hemorrhoids.  12/25/2017 colonoscopy : revealed anal mass, 5-6 cm from the anal verge.  - Diverticulosis in the sigmoid colon. Biopsied. - A few 1 mm, non-bleeding polyps in the sigmoid colon, removed with a cold biopsy forceps. Resected and retrieved. Biopsied.  Biopsy showed  A. COLON POLYP, SIGMOID; COLD BIOPSY: - HYPERPLASTIC POLYP.  - NEGATIVE FOR DYSPLASIA AND MALIGNANCY.   B. RECTUM POLYP; COLD BIOPSY: - HYPERPLASTIC POLYP.  - NEGATIVE FOR DYSPLASIA AND MALIGNANCY.   C. RECTAL MASS; HOT SNARE:  - INVASIVE ADENOCARCINOMA, MODERATELY DIFFERENTIATED, ULCERATED.   # CT chest abdomen pelvis 01/16/2018 showed Wall thickening involving the lower rectum, corresponding to the patient's known rectal cancer. No findings specific for metastatic disease.  Scattered small bilateral pulmonary nodules measuring up to 3 mm, nonspecific but not considered overly suspicious for metastatic disease.   INTERVAL HISTORY Jeffery Mccormick is a 51 y.o. male who has above history reviewed by me today presents for assessment prior to neoadjuvant chemotherapy for rectal cancer, cT3 N0 M0 During last visit he reported a possible perineal abscess drainage.  He was evaluated by Dr. Lysle Pearl.  And patient was found to have a perineal abscess which was aspirated by Dr. Lysle Pearl.Denies fever or chills. During the interval he has been to chemo class and also had a Mediport  placed by Dr. Lysle Pearl.  Today he presents for evaluation prior to starting neoadjuvant chemotherapy. He continues to have intermittent rectal bleeding.  Denies any fatigue, weight loss.  Review of Systems  Constitutional: Negative for chills, fever, malaise/fatigue and weight loss.  HENT: Negative for nosebleeds and sore throat.   Eyes: Negative for double vision, photophobia and redness.  Respiratory: Negative for cough, shortness of breath and wheezing.   Cardiovascular: Negative for chest pain, palpitations and orthopnea.  Gastrointestinal: Positive for blood in stool. Negative for abdominal pain, nausea and vomiting.       Perineum pain/drainage.   Genitourinary: Negative for dysuria.  Musculoskeletal: Negative for back pain, myalgias and neck pain.  Skin: Negative for itching and rash.  Neurological: Negative for dizziness, tingling and tremors.  Endo/Heme/Allergies: Negative for environmental allergies. Does not bruise/bleed easily.  Psychiatric/Behavioral: Negative for depression.    MEDICAL HISTORY:  Past Medical History:  Diagnosis Date  . Hemorrhoid prolapse     SURGICAL HISTORY: Past Surgical History:  Procedure Laterality Date  . COLONOSCOPY WITH PROPOFOL N/A 12/25/2017   Procedure: COLONOSCOPY WITH PROPOFOL;  Surgeon: Benjamine Sprague, DO;  Location: [facility];  Service: General;  Laterality: N/A;  . HERNIA REPAIR  07/2013   Right Inguinal-Dr. Burt Knack  . PORTACATH PLACEMENT Right 02/03/2018   Procedure: INSERTION PORT-A-CATH;  Surgeon: Benjamine Sprague, DO;  Location: [facility];  Service: General;  Laterality: Right;    SOCIAL HISTORY: Social History   Socioeconomic History  . Marital status: Married    Spouse name: Not on file  . Number of children: Not on file  . Years of education: Not on file  . Highest education level:  Not on file  Occupational History  . Occupation: unemployed  Social Needs  . Financial resource strain: Not on file  . Food insecurity:     Worry: Not on file    Inability: Not on file  . Transportation needs:    Medical: Not on file    Non-medical: Not on file  Tobacco Use  . Smoking status: Current Every Day Smoker    Packs/day: 1.00    Years: 30.00    Pack years: 30.00    Types: Cigarettes  . Smokeless tobacco: Never Used  Substance and Sexual Activity  . Alcohol use: Yes    Frequency: Never    Comment: occasionally  . Drug use: No  . Sexual activity: Not on file  Lifestyle  . Physical activity:    Days per week: Not on file    Minutes per session: Not on file  . Stress: Not on file  Relationships  . Social connections:    Talks on phone: Not on file    Gets together: Not on file    Attends religious service: Not on file    Active member of club or organization: Not on file    Attends meetings of clubs or organizations: Not on file    Relationship status: Not on file  . Intimate partner violence:    Fear of current or ex partner: Not on file    Emotionally abused: Not on file    Physically abused: Not on file    Forced sexual activity: Not on file  Other Topics Concern  . Not on file  Social History Narrative  . Not on file    FAMILY HISTORY: Family History  Family history unknown: Yes  patient reports that he does not know any family history.   ALLERGIES:  has No Known Allergies.  MEDICATIONS:  Current Outpatient Medications  Medication Sig Dispense Refill  . acetaminophen (TYLENOL) 325 MG tablet Take 2 tablets (650 mg total) by mouth every 8 (eight) hours as needed for mild pain. 40 tablet 0  . HYDROcodone-acetaminophen (NORCO) 5-325 MG tablet Take 1 tablet by mouth every 6 (six) hours as needed for up to 5 doses for moderate pain. 5 tablet 0  . ibuprofen (ADVIL,MOTRIN) 800 MG tablet Take 1 tablet (800 mg total) by mouth every 8 (eight) hours as needed for mild pain or moderate pain. 30 tablet 0  . lidocaine-prilocaine (EMLA) cream Apply to affected area once 30 g 3  . nystatin  (MYCOSTATIN/NYSTOP) powder Apply topically 4 (four) times daily. 15 g 0  . ondansetron (ZOFRAN) 8 MG tablet Take 1 tablet (8 mg total) by mouth 2 (two) times daily as needed for refractory nausea / vomiting. Start on day 3 after chemotherapy. 30 tablet 1  . polyethylene glycol (MIRALAX / GLYCOLAX) packet Take 17 g by mouth daily as needed for moderate constipation.     . prochlorperazine (COMPAZINE) 10 MG tablet Take 1 tablet (10 mg total) by mouth every 6 (six) hours as needed (Nausea or vomiting). 30 tablet 1   No current facility-administered medications for this visit.    RADIOGRAPHIC STUDIES: I have personally reviewed the radiological images as listed and agreed with the findings in the report. 01/23/2018 MRI pelvis with and without contrast Low rectal/anorectal adenocarcinoma T stage:  Early T3 Rectal adenocarcinoma N stage:  N0 Distance from tumor to the anal sphincter is minimal. The tumor extends to or just above the insertion of the external sphincter.  PHYSICAL EXAMINATION: ECOG  PERFORMANCE STATUS: 1 - Symptomatic but completely ambulatory Vitals:   02/09/18 0901  BP: 136/87  Pulse: 72  Resp: 18  Temp: (!) 96.2 F (35.7 C)   Filed Weights   02/09/18 0901  Weight: 184 lb 1.6 oz (83.5 kg)    Physical Exam  Constitutional: He is oriented to person, place, and time. No distress.  HENT:  Head: Normocephalic and atraumatic.  Mouth/Throat: Oropharynx is clear and moist.  Eyes: Pupils are equal, round, and reactive to light. EOM are normal. No scleral icterus.  Neck: Normal range of motion. Neck supple.  Cardiovascular: Normal rate, regular rhythm and normal heart sounds.  Pulmonary/Chest: Effort normal. No respiratory distress. He has no wheezes.  Abdominal: Soft. Bowel sounds are normal. He exhibits no distension and no mass. There is no tenderness.  Musculoskeletal: Normal range of motion. He exhibits no edema or deformity.  Neurological: He is alert and oriented to  person, place, and time. No cranial nerve deficit. Coordination normal.  Skin: Skin is warm and dry. No rash noted. No erythema.  Psychiatric: He has a normal mood and affect. His behavior is normal. Thought content normal.     LABORATORY DATA:  I have reviewed the data as listed Lab Results  Component Value Date   WBC 8.4 02/09/2018   HGB 15.8 02/09/2018   HCT 45.1 02/09/2018   MCV 94.2 02/09/2018   PLT 228 02/09/2018   Recent Labs    01/29/18 1623 02/06/18 1208 02/09/18 0846  NA 137 137 136  K 3.9 3.9 4.0  CL 105 103 105  CO2 24 24 24   GLUCOSE 101* 105* 133*  BUN 12 10 9   CREATININE 0.87 0.87 0.79  CALCIUM 9.7 9.9 9.0  GFRNONAA >60 >60 >60  GFRAA >60 >60 >60  PROT 8.2* 8.4* 7.4  ALBUMIN 4.6 4.7 4.1  AST 21 21 22   ALT 23 26 23   ALKPHOS 90 89 80  BILITOT 0.6 0.6 0.6   Iron/TIBC/Ferritin/ %Sat No results found for: IRON, TIBC, FERRITIN, IRONPCTSAT   01/29/2018 baseline CEA 11.6   ASSESSMENT & PLAN:  1. Rectal cancer ([place])   2. Encounter for antineoplastic chemotherapy   3. Need for prophylactic vaccination and inoculation against influenza   Cancer Staging Rectal cancer [facility]) Staging form: Colon and Rectum, AJCC 8th Edition - Clinical stage from 01/29/2018: Stage IIA (cT3, cN0, cM0) - Signed by Earlie Server, MD on 02/01/2018  #Low rectal cancer, recommend total neoadjuvant chemotherapy followed by surgery. We had discussed the composition of chemotherapy regimen with FOLFOX Q2w x 4 months, followed by concurrent Xeloda with RT,   I explained to the patient the risks and benefits of chemotherapy FOLFOX including all but not limited to infusion reaction, hair loss, mouth sore, nausea, vomiting, low blood counts, bleeding, and risk of life threatening infection and even death, secondary malignancy etc.  Risk of neuropathy is associated with oxaliplatin. Patient voices understanding and willing to proceed chemotherapy.   #  Antiemetics-Zofran and Compazine; EMLA cream  sent to pharmacy.  I also sent prescription of Imodium Labs reviewed and discussed with patient.  Counts acceptable to proceed with cycle 1 FOLFOX. We also discussed about influenza immunization.  Patient reports not having any upcoming primary care physician's appointment.  Will administrate influenza vaccine at the cancer center today. All questions were answered. The patient knows to call the clinic with any problems questions or concerns.  Return of visit: 1 week for evaluation  Total face to face encounter time  for this patient visit was 34min. >50% of the time was  spent in counseling and coordination of care.   Earlie Server, MD, PhD Hematology Oncology [facility] at [facility] Pager- [phone] 02/09/2018

## 2018-02-09 NOTE — Progress Notes (Signed)
Patient here to receive first treatment of folfox. States having mild drainage and pain to rectum.

## 2018-02-10 MED ORDER — LOPERAMIDE HCL 2 MG PO CAPS: 2.0000 mg | ORAL_CAPSULE | ORAL | 1 refills | Status: DC

## 2018-02-10 NOTE — Addendum Note (Signed)
Addended by: Earlie Server on: 02/10/2018 09:34 AM   Modules accepted: Orders

## 2018-02-11 ENCOUNTER — Inpatient Hospital Stay: Payer: Self-pay

## 2018-02-11 VITALS — BP 135/72 | HR 78 | Temp 96.8°F | Resp 20

## 2018-02-11 DIAGNOSIS — C20 Malignant neoplasm of rectum: Secondary | ICD-10-CM

## 2018-02-11 MED ORDER — HEPARIN SOD (PORK) LOCK FLUSH 100 UNIT/ML IV SOLN
500.0000 [IU] | Freq: Once | INTRAVENOUS | Status: AC | PRN
  Administered 2018-02-11: 500 [IU]
  Filled 2018-02-11: qty 5

## 2018-02-11 MED ORDER — SODIUM CHLORIDE 0.9% FLUSH
10.0000 mL | INTRAVENOUS | Status: DC | PRN
  Administered 2018-02-11: 10 mL
  Filled 2018-02-11: qty 10

## 2018-02-16 ENCOUNTER — Encounter: Payer: Self-pay | Admitting: Oncology

## 2018-02-16 ENCOUNTER — Inpatient Hospital Stay: Payer: Self-pay

## 2018-02-16 ENCOUNTER — Other Ambulatory Visit: Payer: Self-pay

## 2018-02-16 ENCOUNTER — Inpatient Hospital Stay (HOSPITAL_BASED_OUTPATIENT_CLINIC_OR_DEPARTMENT_OTHER): Payer: Self-pay | Admitting: Oncology

## 2018-02-16 VITALS — BP 132/84 | HR 76 | Temp 97.6°F | Resp 18 | Wt 182.0 lb

## 2018-02-16 DIAGNOSIS — C20 Malignant neoplasm of rectum: Secondary | ICD-10-CM

## 2018-02-16 DIAGNOSIS — F17210 Nicotine dependence, cigarettes, uncomplicated: Secondary | ICD-10-CM

## 2018-02-16 DIAGNOSIS — K625 Hemorrhage of anus and rectum: Secondary | ICD-10-CM

## 2018-02-16 DIAGNOSIS — K6289 Other specified diseases of anus and rectum: Secondary | ICD-10-CM

## 2018-02-16 LAB — CBC WITH DIFFERENTIAL/PLATELET
Abs Immature Granulocytes: 0.01 10*3/uL (ref 0.00–0.07)
Basophils Absolute: 0.1 10*3/uL (ref 0.0–0.1)
Basophils Relative: 1 %
EOS ABS: 0.3 10*3/uL (ref 0.0–0.5)
Eosinophils Relative: 4 %
HEMATOCRIT: 43.9 % (ref 39.0–52.0)
Hemoglobin: 15.5 g/dL (ref 13.0–17.0)
IMMATURE GRANULOCYTES: 0 %
LYMPHS ABS: 2.3 10*3/uL (ref 0.7–4.0)
LYMPHS PCT: 35 %
MCH: 32.8 pg (ref 26.0–34.0)
MCHC: 35.3 g/dL (ref 30.0–36.0)
MCV: 92.8 fL (ref 80.0–100.0)
Monocytes Absolute: 0.5 10*3/uL (ref 0.1–1.0)
Monocytes Relative: 8 %
NEUTROS PCT: 52 %
NRBC: 0 % (ref 0.0–0.2)
Neutro Abs: 3.4 10*3/uL (ref 1.7–7.7)
Platelets: 193 10*3/uL (ref 150–400)
RBC: 4.73 MIL/uL (ref 4.22–5.81)
RDW: 12.5 % (ref 11.5–15.5)
WBC: 6.6 10*3/uL (ref 4.0–10.5)

## 2018-02-16 LAB — COMPREHENSIVE METABOLIC PANEL
ALT: 22 U/L (ref 0–44)
ANION GAP: 9 (ref 5–15)
AST: 17 U/L (ref 15–41)
Albumin: 4.2 g/dL (ref 3.5–5.0)
Alkaline Phosphatase: 69 U/L (ref 38–126)
BILIRUBIN TOTAL: 0.5 mg/dL (ref 0.3–1.2)
BUN: 10 mg/dL (ref 6–20)
CHLORIDE: 105 mmol/L (ref 98–111)
CO2: 22 mmol/L (ref 22–32)
Calcium: 9.1 mg/dL (ref 8.9–10.3)
Creatinine, Ser: 0.86 mg/dL (ref 0.61–1.24)
GFR calc Af Amer: >60 mL/min (ref >60)
GFR calc non Af Amer: >60 mL/min (ref >60)
GLUCOSE: 118 mg/dL — AB (ref 70–99)
POTASSIUM: 4 mmol/L (ref 3.5–5.1)
Sodium: 136 mmol/L (ref 135–145)
TOTAL PROTEIN: 7.1 g/dL (ref 6.5–8.1)

## 2018-02-16 NOTE — Progress Notes (Signed)
Hematology/Oncology follow-up note [facility] [phone] [phone]   Patient Care Team: Katheren Shams as PCP - General [name], RN as Registered Nurse  REFERRING PROVIDER: Dr.Thomas CHIEF COMPLAINTS/REASON FOR VISIT:  Follow-up for treatment of rectal cancer, assessment of tolerability of chemotherapy.  HISTORY OF PRESENTING ILLNESS:  Jeffery Mccormick is a  52 y.o.  male with PMH listed below who was referred to me for evaluation of rectal cancer.   He was evaluated by surgeon for evaluation of chronic rectal bleeding, ongoing chronic hemorrhoids.  12/25/2017 colonoscopy : revealed anal mass, 5-6 cm from the anal verge.  - Diverticulosis in the sigmoid colon. Biopsied. - A few 1 mm, non-bleeding polyps in the sigmoid colon, removed with a cold biopsy forceps. Resected and retrieved. Biopsied.  Biopsy showed  A. COLON POLYP, SIGMOID; COLD BIOPSY: - HYPERPLASTIC POLYP.  - NEGATIVE FOR DYSPLASIA AND MALIGNANCY.   B. RECTUM POLYP; COLD BIOPSY: - HYPERPLASTIC POLYP.  - NEGATIVE FOR DYSPLASIA AND MALIGNANCY.   C. RECTAL MASS; HOT SNARE:  - INVASIVE ADENOCARCINOMA, MODERATELY DIFFERENTIATED, ULCERATED.   # CT chest abdomen pelvis 01/16/2018 showed Wall thickening involving the lower rectum, corresponding to the patient's known rectal cancer. No findings specific for metastatic disease.  Scattered small bilateral pulmonary nodules measuring up to 3 mm, nonspecific but not considered overly suspicious for metastatic disease.   INTERVAL HISTORY Jeffery Mccormick is a 52 y.o. male who has above history reviewed by me today presents for assessment after cycle 1 neoadjuvant chemotherapy for rectal cancer. cT3 N0 M0  #Ports tolerating chemotherapy well.  No nausea, vomiting.  He reports that he had diarrhea for 1 day, and symptoms spontaneously resolved.  Usually he is constipated, so he did not take Imodium. #Continue to  have pain in the rectal area.  Is not taking any pain medication. #Continue to have intermittent rectal bleeding, so reports seeing mucus in the stool.  Denies any fatigue, weight loss. #Right toes, reports feeling, symptom spontaneously resolved. He also recalls having some throat spasms after drinking cold water.  Review of Systems  Constitutional: Negative for chills, fever, malaise/fatigue and weight loss.  HENT: Negative for nosebleeds and sore throat.   Eyes: Negative for double vision, photophobia and redness.  Respiratory: Negative for cough, shortness of breath and wheezing.   Cardiovascular: Negative for chest pain, palpitations and orthopnea.  Gastrointestinal: Positive for blood in stool. Negative for abdominal pain, nausea and vomiting.  Genitourinary: Negative for dysuria.  Musculoskeletal: Negative for back pain, myalgias and neck pain.  Skin: Negative for itching and rash.  Neurological: Negative for dizziness, tingling and tremors.  Endo/Heme/Allergies: Negative for environmental allergies. Does not bruise/bleed easily.  Psychiatric/Behavioral: Negative for depression.    MEDICAL HISTORY:  Past Medical History:  Diagnosis Date  . Hemorrhoid prolapse     SURGICAL HISTORY: Past Surgical History:  Procedure Laterality Date  . COLONOSCOPY WITH PROPOFOL N/A 12/25/2017   Procedure: COLONOSCOPY WITH PROPOFOL;  Surgeon: Benjamine Sprague, DO;  Location: [place] [facility];  Service: General;  Laterality: N/A;  . HERNIA REPAIR  07/2013   Right Inguinal-Dr. Burt Knack  . PORTACATH PLACEMENT Right 02/03/2018   Procedure: INSERTION PORT-A-CATH;  Surgeon: Benjamine Sprague, DO;  Location: [facility];  Service: General;  Laterality: Right;    SOCIAL HISTORY: Social History   Socioeconomic History  . Marital status: Married    Spouse name: Not on file  . Number of children: Not on file  . Years of education: Not  on file  . Highest education level: Not on file  Occupational History  .  Occupation: unemployed  Social Needs  . Financial resource strain: Not on file  . Food insecurity:    Worry: Not on file    Inability: Not on file  . Transportation needs:    Medical: Not on file    Non-medical: Not on file  Tobacco Use  . Smoking status: Current Every Day Smoker    Packs/day: 1.00    Years: 30.00    Pack years: 30.00    Types: Cigarettes  . Smokeless tobacco: Never Used  Substance and Sexual Activity  . Alcohol use: Yes    Frequency: Never    Comment: occasionally  . Drug use: No  . Sexual activity: Not on file  Lifestyle  . Physical activity:    Days per week: Not on file    Minutes per session: Not on file  . Stress: Not on file  Relationships  . Social connections:    Talks on phone: Not on file    Gets together: Not on file    Attends religious service: Not on file    Active member of club or organization: Not on file    Attends meetings of clubs or organizations: Not on file    Relationship status: Not on file  . Intimate partner violence:    Fear of current or ex partner: Not on file    Emotionally abused: Not on file    Physically abused: Not on file    Forced sexual activity: Not on file  Other Topics Concern  . Not on file  Social History Narrative  . Not on file    FAMILY HISTORY: Family History  Family history unknown: Yes  patient reports that he does not know any family history.   ALLERGIES:  has No Known Allergies.  MEDICATIONS:  Current Outpatient Medications  Medication Sig Dispense Refill  . acetaminophen (TYLENOL) 325 MG tablet Take 2 tablets (650 mg total) by mouth every 8 (eight) hours as needed for mild pain. 40 tablet 0  . HYDROcodone-acetaminophen (NORCO) 5-325 MG tablet Take 1 tablet by mouth every 6 (six) hours as needed for up to 5 doses for moderate pain. 5 tablet 0  . ibuprofen (ADVIL,MOTRIN) 800 MG tablet Take 1 tablet (800 mg total) by mouth every 8 (eight) hours as needed for mild pain or moderate pain. 30  tablet 0  . lidocaine-prilocaine (EMLA) cream Apply to affected area once 30 g 3  . loperamide (IMODIUM) 2 MG capsule Take 1 capsule (2 mg total) by mouth See admin instructions. With onset of loose stool, take 4mg  followed by 2mg  every 2 hours until 12 hours have passed without loose bowel movement. Maximum: 16 mg/day 120 capsule 1  . nystatin (MYCOSTATIN/NYSTOP) powder Apply topically 4 (four) times daily. 15 g 0  . ondansetron (ZOFRAN) 8 MG tablet Take 1 tablet (8 mg total) by mouth 2 (two) times daily as needed for refractory nausea / vomiting. Start on day 3 after chemotherapy. 30 tablet 1  . polyethylene glycol (MIRALAX / GLYCOLAX) packet Take 17 g by mouth daily as needed for moderate constipation.     . prochlorperazine (COMPAZINE) 10 MG tablet Take 1 tablet (10 mg total) by mouth every 6 (six) hours as needed (Nausea or vomiting). 30 tablet 1   No current facility-administered medications for this visit.    RADIOGRAPHIC STUDIES: I have personally reviewed the radiological images as listed and agreed  with the findings in the report. 01/23/2018 MRI pelvis with and without contrast Low rectal/anorectal adenocarcinoma T stage:  Early T3 Rectal adenocarcinoma N stage:  N0 Distance from tumor to the anal sphincter is minimal. The tumor extends to or just above the insertion of the external sphincter.  PHYSICAL EXAMINATION: ECOG PERFORMANCE STATUS: 1 - Symptomatic but completely ambulatory Vitals:   02/16/18 1005  BP: 132/84  Pulse: 76  Resp: 18  Temp: 97.6 F (36.4 C)   Filed Weights   02/16/18 1005  Weight: 182 lb (82.6 kg)    Physical Exam  Constitutional: He is oriented to person, place, and time. No distress.  HENT:  Head: Normocephalic and atraumatic.  Mouth/Throat: Oropharynx is clear and moist.  Eyes: Pupils are equal, round, and reactive to light. EOM are normal. No scleral icterus.  Neck: Normal range of motion. Neck supple.  Cardiovascular: Normal rate, regular  rhythm and normal heart sounds.  Pulmonary/Chest: Effort normal. No respiratory distress. He has no wheezes.  Abdominal: Soft. Bowel sounds are normal. He exhibits no distension and no mass. There is no tenderness.  Musculoskeletal: Normal range of motion. He exhibits no edema or deformity.  Neurological: He is alert and oriented to person, place, and time. No cranial nerve deficit. Coordination normal.  Skin: Skin is warm and dry. No rash noted. No erythema.  Psychiatric: He has a normal mood and affect. His behavior is normal. Thought content normal.     LABORATORY DATA:  I have reviewed the data as listed Lab Results  Component Value Date   WBC 6.6 02/16/2018   HGB 15.5 02/16/2018   HCT 43.9 02/16/2018   MCV 92.8 02/16/2018   PLT 193 02/16/2018   Recent Labs    01/29/18 1623 02/06/18 1208 02/09/18 0846  NA 137 137 136  K 3.9 3.9 4.0  CL 105 103 105  CO2 24 24 24   GLUCOSE 101* 105* 133*  BUN 12 10 9   CREATININE 0.87 0.87 0.79  CALCIUM 9.7 9.9 9.0  GFRNONAA >60 >60 >60  GFRAA >60 >60 >60  PROT 8.2* 8.4* 7.4  ALBUMIN 4.6 4.7 4.1  AST 21 21 22   ALT 23 26 23   ALKPHOS 90 89 80  BILITOT 0.6 0.6 0.6   Iron/TIBC/Ferritin/ %Sat No results found for: IRON, TIBC, FERRITIN, IRONPCTSAT   01/29/2018 baseline CEA 11.6   ASSESSMENT & PLAN:  1. Rectal cancer ([place])   2. Rectal pain   3. Rectal bleeding   Cancer Staging Rectal cancer [facility]) Staging form: Colon and Rectum, AJCC 8th Edition - Clinical stage from 01/29/2018: Stage IIA (cT3, cN0, cM0) - Signed by Earlie Server, MD on 02/01/2018  #Low rectal cancer, total neoadjuvant therapy.chemotherapy regimen with FOLFOX Q2w x 4 months, followed by concurrent Xeloda with RT,  Status post cycle 1 FOLFOX Tolerates well with minimal diarrhea, no nausea vomiting. Labs reviewed and discussed with patient.  #Rectal pain, advised patient to start use Tylenol 650 mg every 6 hours as needed.  If pain is not controlled, will consider  low-dose narcotics. #Rectal bleeding, chronic problem for patient.  Hemoglobin stable. Patient and wife have multiple questions regarding chemotherapy. We spent sufficient time to discuss many aspect of care, questions were answered to patient's satisfaction. The patient knows to call the clinic with any problems questions or concerns.  Return of visit: 1 week for assessment prior to cycle 2 chemotherapy. Total face to face encounter time for this patient visit was 25 min. >50% of the time  was  spent in counseling and coordination of care.   Earlie Server, MD, PhD Hematology Oncology [facility] Pager- [phone] 02/16/2018

## 2018-02-16 NOTE — Progress Notes (Signed)
Patient here for follow up

## 2018-02-23 ENCOUNTER — Inpatient Hospital Stay (HOSPITAL_BASED_OUTPATIENT_CLINIC_OR_DEPARTMENT_OTHER): Payer: Self-pay | Admitting: Oncology

## 2018-02-23 ENCOUNTER — Encounter: Payer: Self-pay | Admitting: Oncology

## 2018-02-23 ENCOUNTER — Inpatient Hospital Stay: Payer: Self-pay

## 2018-02-23 ENCOUNTER — Other Ambulatory Visit: Payer: Self-pay

## 2018-02-23 VITALS — BP 121/81 | HR 76 | Temp 97.8°F | Resp 18 | Wt 183.6 lb

## 2018-02-23 DIAGNOSIS — Z5111 Encounter for antineoplastic chemotherapy: Secondary | ICD-10-CM

## 2018-02-23 DIAGNOSIS — R197 Diarrhea, unspecified: Secondary | ICD-10-CM

## 2018-02-23 DIAGNOSIS — G620 Drug-induced polyneuropathy: Secondary | ICD-10-CM

## 2018-02-23 DIAGNOSIS — C20 Malignant neoplasm of rectum: Secondary | ICD-10-CM

## 2018-02-23 DIAGNOSIS — T451X5A Adverse effect of antineoplastic and immunosuppressive drugs, initial encounter: Secondary | ICD-10-CM

## 2018-02-23 DIAGNOSIS — K625 Hemorrhage of anus and rectum: Secondary | ICD-10-CM

## 2018-02-23 DIAGNOSIS — K6289 Other specified diseases of anus and rectum: Secondary | ICD-10-CM

## 2018-02-23 LAB — COMPREHENSIVE METABOLIC PANEL
ALBUMIN: 4.2 g/dL (ref 3.5–5.0)
ALK PHOS: 74 U/L (ref 38–126)
ALT: 35 U/L (ref 0–44)
AST: 27 U/L (ref 15–41)
Anion gap: 7 (ref 5–15)
BILIRUBIN TOTAL: 0.4 mg/dL (ref 0.3–1.2)
BUN: 15 mg/dL (ref 6–20)
CALCIUM: 9.2 mg/dL (ref 8.9–10.3)
CO2: 23 mmol/L (ref 22–32)
CREATININE: 0.89 mg/dL (ref 0.61–1.24)
Chloride: 107 mmol/L (ref 98–111)
GFR calc Af Amer: >60 mL/min (ref >60)
GLUCOSE: 98 mg/dL (ref 70–99)
Potassium: 4 mmol/L (ref 3.5–5.1)
Sodium: 137 mmol/L (ref 135–145)
TOTAL PROTEIN: 7.4 g/dL (ref 6.5–8.1)

## 2018-02-23 LAB — CBC WITH DIFFERENTIAL/PLATELET
ABS IMMATURE GRANULOCYTES: 0.01 10*3/uL (ref 0.00–0.07)
BASOS PCT: 0 %
Basophils Absolute: 0 10*3/uL (ref 0.0–0.1)
EOS ABS: 0.1 10*3/uL (ref 0.0–0.5)
EOS PCT: 2 %
HCT: 42.7 % (ref 39.0–52.0)
Hemoglobin: 14.8 g/dL (ref 13.0–17.0)
Immature Granulocytes: 0 %
Lymphocytes Relative: 29 %
Lymphs Abs: 1.8 10*3/uL (ref 0.7–4.0)
MCH: 32.5 pg (ref 26.0–34.0)
MCHC: 34.7 g/dL (ref 30.0–36.0)
MCV: 93.6 fL (ref 80.0–100.0)
MONO ABS: 0.9 10*3/uL (ref 0.1–1.0)
Monocytes Relative: 13 %
Neutro Abs: 3.6 10*3/uL (ref 1.7–7.7)
Neutrophils Relative %: 56 %
Platelets: 216 10*3/uL (ref 150–400)
RBC: 4.56 MIL/uL (ref 4.22–5.81)
RDW: 13 % (ref 11.5–15.5)
WBC: 6.4 10*3/uL (ref 4.0–10.5)
nRBC: 0 % (ref 0.0–0.2)

## 2018-02-23 MED ORDER — FLUOROURACIL CHEMO INJECTION 2.5 GM/50ML
400.0000 mg/m2 | Freq: Once | INTRAVENOUS | Status: AC
  Administered 2018-02-23: 800 mg via INTRAVENOUS
  Filled 2018-02-23: qty 16

## 2018-02-23 MED ORDER — PALONOSETRON HCL INJECTION 0.25 MG/5ML
0.2500 mg | Freq: Once | INTRAVENOUS | Status: AC
  Administered 2018-02-23: 0.25 mg via INTRAVENOUS
  Filled 2018-02-23: qty 5

## 2018-02-23 MED ORDER — HEPARIN SOD (PORK) LOCK FLUSH 100 UNIT/ML IV SOLN
500.0000 [IU] | Freq: Once | INTRAVENOUS | Status: DC
  Filled 2018-02-23: qty 5

## 2018-02-23 MED ORDER — LEUCOVORIN CALCIUM INJECTION 350 MG
800.0000 mg | Freq: Once | INTRAVENOUS | Status: AC
  Administered 2018-02-23: 800 mg via INTRAVENOUS
  Filled 2018-02-23: qty 17.5

## 2018-02-23 MED ORDER — SODIUM CHLORIDE 0.9% FLUSH
10.0000 mL | INTRAVENOUS | Status: DC | PRN
  Administered 2018-02-23: 10 mL via INTRAVENOUS
  Filled 2018-02-23: qty 10

## 2018-02-23 MED ORDER — DEXTROSE 5 % IV SOLN
Freq: Once | INTRAVENOUS | Status: AC
  Administered 2018-02-23: 09:00:00 via INTRAVENOUS
  Filled 2018-02-23: qty 250

## 2018-02-23 MED ORDER — DEXAMETHASONE SODIUM PHOSPHATE 10 MG/ML IJ SOLN
10.0000 mg | Freq: Once | INTRAMUSCULAR | Status: AC
  Administered 2018-02-23: 10 mg via INTRAVENOUS
  Filled 2018-02-23: qty 1

## 2018-02-23 MED ORDER — OXALIPLATIN CHEMO INJECTION 100 MG/20ML
85.0000 mg/m2 | Freq: Once | INTRAVENOUS | Status: AC
  Administered 2018-02-23: 175 mg via INTRAVENOUS
  Filled 2018-02-23: qty 35

## 2018-02-23 MED ORDER — SODIUM CHLORIDE 0.9 % IV SOLN
2400.0000 mg/m2 | INTRAVENOUS | Status: DC
  Administered 2018-02-23: 4900 mg via INTRAVENOUS
  Filled 2018-02-23: qty 98

## 2018-02-23 NOTE — Progress Notes (Signed)
Hematology/Oncology follow-up note Jeffery Mccormick Telephone:([phone] [phone]   Patient Care Team: Katheren Shams as PCP - General [name], RN as Registered Nurse  REFERRING PROVIDER: Dr.Thomas CHIEF COMPLAINTS/REASON FOR VISIT:  Follow-up for treatment of rectal cancer, assessment of tolerability of chemotherapy.  HISTORY OF PRESENTING ILLNESS:  Jeffery Mccormick is a  52 y.o.  male with PMH listed below who was referred to me for evaluation of rectal cancer.   He was evaluated by surgeon for evaluation of chronic rectal bleeding, ongoing chronic hemorrhoids.  12/25/2017 colonoscopy : revealed anal mass, 5-6 cm from the anal verge.  - Diverticulosis in the sigmoid colon. Biopsied. - A few 1 mm, non-bleeding polyps in the sigmoid colon, removed with a cold biopsy forceps. Resected and retrieved. Biopsied.  Biopsy showed  A. COLON POLYP, SIGMOID; COLD BIOPSY: - HYPERPLASTIC POLYP.  - NEGATIVE FOR DYSPLASIA AND MALIGNANCY.   B. RECTUM POLYP; COLD BIOPSY: - HYPERPLASTIC POLYP.  - NEGATIVE FOR DYSPLASIA AND MALIGNANCY.   C. RECTAL MASS; HOT SNARE:  - INVASIVE ADENOCARCINOMA, MODERATELY DIFFERENTIATED, ULCERATED.   # CT chest abdomen pelvis 01/16/2018 showed Wall thickening involving the lower rectum, corresponding to the patient's known rectal cancer. No findings specific for metastatic disease.  Scattered small bilateral pulmonary nodules measuring up to 3 mm, nonspecific but not considered overly suspicious for metastatic disease.   INTERVAL HISTORY Jeffery Mccormick is a 52 y.o. male who has above history reviewed by me today presents for assessment prior to cycle 2 neoadjuvant chemotherapy for rectal cancer cT3 N0 M0  #Reports rectal pain has slightly improved.  He takes Tylenol as instructed.  He was not screaming when  passing bowel movement.   #Diarrhea also resolved. #No additional episodes of rectal bleeding. #Right  total and bottom of feet, intermittent numbness and tingling, symptoms spontaneously resolve   Review of Systems  Constitutional: Negative for chills, fever, malaise/fatigue and weight loss.  HENT: Negative for nosebleeds and sore throat.   Eyes: Negative for double vision, photophobia and redness.  Respiratory: Negative for cough, shortness of breath and wheezing.   Cardiovascular: Negative for chest pain, palpitations and orthopnea.  Gastrointestinal: Negative for abdominal pain, blood in stool, nausea and vomiting.       Rectal pain   Genitourinary: Negative for dysuria.  Musculoskeletal: Negative for back pain, myalgias and neck pain.  Skin: Negative for itching and rash.  Neurological: Negative for dizziness, tingling and tremors.  Endo/Heme/Allergies: Negative for environmental allergies. Does not bruise/bleed easily.  Psychiatric/Behavioral: Negative for depression.    MEDICAL HISTORY:  Past Medical History:  Diagnosis Date  . Hemorrhoid prolapse     SURGICAL HISTORY: Past Surgical History:  Procedure Laterality Date  . COLONOSCOPY WITH PROPOFOL N/A 12/25/2017   Procedure: COLONOSCOPY WITH PROPOFOL;  Surgeon: Benjamine Sprague, DO;  Location: [place] [facility];  Service: General;  Laterality: N/A;  . HERNIA REPAIR  07/2013   Right Inguinal-Dr. Burt Knack  . PORTACATH PLACEMENT Right 02/03/2018   Procedure: INSERTION PORT-A-CATH;  Surgeon: Benjamine Sprague, DO;  Location: [facility];  Service: General;  Laterality: Right;    SOCIAL HISTORY: Social History   Socioeconomic History  . Marital status: Married    Spouse name: Not on file  . Number of children: Not on file  . Years of education: Not on file  . Highest education level: Not on file  Occupational History  . Occupation: unemployed  Social Needs  . Financial resource strain: Not on file  .  Food insecurity:    Worry: Not on file    Inability: Not on file  . Transportation needs:    Medical: Not on file    Non-medical:  Not on file  Tobacco Use  . Smoking status: Current Every Day Smoker    Packs/day: 1.00    Years: 30.00    Pack years: 30.00    Types: Cigarettes  . Smokeless tobacco: Never Used  Substance and Sexual Activity  . Alcohol use: Yes    Frequency: Never    Comment: occasionally  . Drug use: No  . Sexual activity: Not on file  Lifestyle  . Physical activity:    Days per week: Not on file    Minutes per session: Not on file  . Stress: Not on file  Relationships  . Social connections:    Talks on phone: Not on file    Gets together: Not on file    Attends religious service: Not on file    Active member of club or organization: Not on file    Attends meetings of clubs or organizations: Not on file    Relationship status: Not on file  . Intimate partner violence:    Fear of current or ex partner: Not on file    Emotionally abused: Not on file    Physically abused: Not on file    Forced sexual activity: Not on file  Other Topics Concern  . Not on file  Social History Narrative  . Not on file    FAMILY HISTORY: Family History  Family history unknown: Yes  patient reports that he does not know any family history.   ALLERGIES:  has No Known Allergies.  MEDICATIONS:  Current Outpatient Medications  Medication Sig Dispense Refill  . acetaminophen (TYLENOL) 325 MG tablet Take 2 tablets (650 mg total) by mouth every 8 (eight) hours as needed for mild pain. 40 tablet 0  . HYDROcodone-acetaminophen (NORCO) 5-325 MG tablet Take 1 tablet by mouth every 6 (six) hours as needed for up to 5 doses for moderate pain. 5 tablet 0  . lidocaine-prilocaine (EMLA) cream Apply to affected area once 30 g 3  . loperamide (IMODIUM) 2 MG capsule Take 1 capsule (2 mg total) by mouth See admin instructions. With onset of loose stool, take 4mg  followed by 2mg  every 2 hours until 12 hours have passed without loose bowel movement. Maximum: 16 mg/day 120 capsule 1  . nystatin (MYCOSTATIN/NYSTOP) powder  Apply topically 4 (four) times daily. 15 g 0  . ondansetron (ZOFRAN) 8 MG tablet Take 1 tablet (8 mg total) by mouth 2 (two) times daily as needed for refractory nausea / vomiting. Start on day 3 after chemotherapy. 30 tablet 1  . polyethylene glycol (MIRALAX / GLYCOLAX) packet Take 17 g by mouth daily as needed for moderate constipation.     . prochlorperazine (COMPAZINE) 10 MG tablet Take 1 tablet (10 mg total) by mouth every 6 (six) hours as needed (Nausea or vomiting). 30 tablet 1  . ibuprofen (ADVIL,MOTRIN) 800 MG tablet Take 1 tablet (800 mg total) by mouth every 8 (eight) hours as needed for mild pain or moderate pain. (Patient not taking: Reported on 02/23/2018) 30 tablet 0   No current facility-administered medications for this visit.    Facility-Administered Medications Ordered in Other Visits  Medication Dose Route Frequency Provider Last Rate Last Dose  . dexamethasone (DECADRON) injection 10 mg  10 mg Intravenous Once Earlie Server, MD      .  dextrose 5 % solution   Intravenous Once Earlie Server, MD      . fluorouracil (ADRUCIL) 4,900 mg in sodium chloride 0.9 % 52 mL chemo infusion  2,400 mg/m2 (Treatment Plan Recorded) Intravenous 1 day or 1 dose Earlie Server, MD      . fluorouracil (ADRUCIL) chemo injection 800 mg  400 mg/m2 (Treatment Plan Recorded) Intravenous Once Earlie Server, MD      . heparin lock flush 100 unit/mL  500 Units Intravenous Once Earlie Server, MD      . leucovorin 816 mg in dextrose 5 % 250 mL infusion  400 mg/m2 (Treatment Plan Recorded) Intravenous Once Earlie Server, MD      . oxaliplatin (ELOXATIN) 175 mg in dextrose 5 % 500 mL chemo infusion  85 mg/m2 (Treatment Plan Recorded) Intravenous Once Earlie Server, MD      . palonosetron (ALOXI) injection 0.25 mg  0.25 mg Intravenous Once Earlie Server, MD      . sodium chloride flush (NS) 0.9 % injection 10 mL  10 mL Intravenous PRN Earlie Server, MD   10 mL at 02/23/18 0825   RADIOGRAPHIC STUDIES: I have personally reviewed the radiological images as  listed and agreed with the findings in the report. 01/23/2018 MRI pelvis with and without contrast Low rectal/anorectal adenocarcinoma T stage:  Early T3 Rectal adenocarcinoma N stage:  N0 Distance from tumor to the anal sphincter is minimal. The tumor extends to or just above the insertion of the external sphincter.  PHYSICAL EXAMINATION: ECOG PERFORMANCE STATUS: 1 - Symptomatic but completely ambulatory Vitals:   02/23/18 0843  BP: 121/81  Pulse: 76  Resp: 18  Temp: 97.8 F (36.6 C)   Filed Weights   02/23/18 0843  Weight: 183 lb 9.6 oz (83.3 kg)    Physical Exam  Constitutional: He is oriented to person, place, and time. No distress.  HENT:  Head: Normocephalic and atraumatic.  Mouth/Throat: Oropharynx is clear and moist.  Eyes: Pupils are equal, round, and reactive to light. EOM are normal. No scleral icterus.  Neck: Normal range of motion. Neck supple.  Cardiovascular: Normal rate, regular rhythm and normal heart sounds.  Pulmonary/Chest: Effort normal. No respiratory distress. He has no wheezes.  Abdominal: Soft. Bowel sounds are normal. He exhibits no distension and no mass. There is no tenderness.  Musculoskeletal: Normal range of motion. He exhibits no edema or deformity.  Neurological: He is alert and oriented to person, place, and time. No cranial nerve deficit. Coordination normal.  Skin: Skin is warm and dry. No rash noted. No erythema.  Psychiatric: He has a normal mood and affect. His behavior is normal. Thought content normal.     LABORATORY DATA:  I have reviewed the data as listed Lab Results  Component Value Date   WBC 6.4 02/23/2018   HGB 14.8 02/23/2018   HCT 42.7 02/23/2018   MCV 93.6 02/23/2018   PLT 216 02/23/2018   Recent Labs    02/09/18 0846 02/16/18 0939 02/23/18 0817  NA 136 136 137  K 4.0 4.0 4.0  CL 105 105 107  CO2 24 22 23   GLUCOSE 133* 118* 98  BUN 9 10 15   CREATININE 0.79 0.86 0.89  CALCIUM 9.0 9.1 9.2  GFRNONAA >60 >60  >60  GFRAA >60 >60 >60  PROT 7.4 7.1 7.4  ALBUMIN 4.1 4.2 4.2  AST 22 17 27   ALT 23 22 35  ALKPHOS 80 69 74  BILITOT 0.6 0.5 0.4   Iron/TIBC/Ferritin/ %Sat  No results found for: IRON, TIBC, FERRITIN, IRONPCTSAT   01/29/2018 baseline CEA 11.6   ASSESSMENT & PLAN:  1. Rectal cancer ([place])   2. Rectal pain   3. 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 Rectal bleeding   Cancer Staging Rectal cancer [facility]) Staging form: Colon and Rectum, AJCC 8th Edition - Clinical stage from 01/29/2018: Stage IIA (cT3, cN0, cM0) - Signed by Earlie Server, MD on 02/01/2018  #Low rectal cancer, plan total neoadjuvant therapy.chemotherapy regimen with FOLFOX Q2w x 4 months, followed by concurrent Xeloda with RT,  Status post cycle 2 FOLFOX Tolerates chemotherapy well.  Manageable diarrhea.   No nausea or vomiting. Labs were reviewed and discussed with patient.  Counts acceptable to proceed with cycle 2 FOLFOX.  #Rectal pain, slightly improved. continue Tylenol 650 mg every 6 hours as needed.    #Rectal bleeding, chronic problem for patient.  Hemoglobin stable.  No more additional episodes.   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  Continue monitor.  Advised patient that he may takes over-the-counter B6.  We spent sufficient time to discuss many aspect of care, questions were answered to patient's satisfaction. The patient knows to call the clinic with any problems questions or concerns.  Return of visit: 2 weeka for assessment prior to cycle 3 chemotherapy.   Earlie Server, MD, PhD Hematology Oncology [facility] Pager- [phone] 02/23/2018

## 2018-02-23 NOTE — Progress Notes (Signed)
Patient here for follow up. He states bowel movements are starting to get regular.

## 2018-02-24 LAB — CEA: CEA: 9.1 ng/mL — ABNORMAL HIGH (ref 0.0–4.7)

## 2018-02-25 ENCOUNTER — Inpatient Hospital Stay: Payer: Self-pay

## 2018-02-25 DIAGNOSIS — C20 Malignant neoplasm of rectum: Secondary | ICD-10-CM

## 2018-02-25 MED ORDER — HEPARIN SOD (PORK) LOCK FLUSH 100 UNIT/ML IV SOLN
500.0000 [IU] | Freq: Once | INTRAVENOUS | Status: AC | PRN
  Administered 2018-02-25: 500 [IU]

## 2018-02-25 MED ORDER — SODIUM CHLORIDE 0.9% FLUSH
10.0000 mL | INTRAVENOUS | Status: DC | PRN
  Administered 2018-02-25: 10 mL
  Filled 2018-02-25: qty 10

## 2018-02-25 MED ORDER — HEPARIN SOD (PORK) LOCK FLUSH 100 UNIT/ML IV SOLN
INTRAVENOUS | Status: AC
  Filled 2018-02-25: qty 5

## 2018-03-09 ENCOUNTER — Other Ambulatory Visit: Payer: Self-pay

## 2018-03-09 ENCOUNTER — Inpatient Hospital Stay (HOSPITAL_BASED_OUTPATIENT_CLINIC_OR_DEPARTMENT_OTHER): Payer: Self-pay | Admitting: Oncology

## 2018-03-09 ENCOUNTER — Inpatient Hospital Stay: Payer: Self-pay

## 2018-03-09 ENCOUNTER — Inpatient Hospital Stay: Payer: Self-pay | Attending: Oncology

## 2018-03-09 ENCOUNTER — Encounter: Payer: Self-pay | Admitting: Oncology

## 2018-03-09 VITALS — BP 143/91 | HR 71 | Temp 96.3°F | Resp 18 | Wt 185.2 lb

## 2018-03-09 DIAGNOSIS — Z5111 Encounter for antineoplastic chemotherapy: Secondary | ICD-10-CM

## 2018-03-09 DIAGNOSIS — C20 Malignant neoplasm of rectum: Secondary | ICD-10-CM

## 2018-03-09 DIAGNOSIS — F17210 Nicotine dependence, cigarettes, uncomplicated: Secondary | ICD-10-CM

## 2018-03-09 DIAGNOSIS — R918 Other nonspecific abnormal finding of lung field: Secondary | ICD-10-CM

## 2018-03-09 DIAGNOSIS — G620 Drug-induced polyneuropathy: Secondary | ICD-10-CM

## 2018-03-09 DIAGNOSIS — T451X5A Adverse effect of antineoplastic and immunosuppressive drugs, initial encounter: Secondary | ICD-10-CM

## 2018-03-09 DIAGNOSIS — B356 Tinea cruris: Secondary | ICD-10-CM

## 2018-03-09 DIAGNOSIS — K6289 Other specified diseases of anus and rectum: Secondary | ICD-10-CM

## 2018-03-09 LAB — CBC WITH DIFFERENTIAL/PLATELET
Abs Immature Granulocytes: 0.01 10*3/uL (ref 0.00–0.07)
BASOS ABS: 0 10*3/uL (ref 0.0–0.1)
BASOS PCT: 1 %
Eosinophils Absolute: 0.1 10*3/uL (ref 0.0–0.5)
Eosinophils Relative: 2 %
HCT: 41.2 % (ref 39.0–52.0)
HEMOGLOBIN: 14.4 g/dL (ref 13.0–17.0)
IMMATURE GRANULOCYTES: 0 %
LYMPHS PCT: 33 %
Lymphs Abs: 2.2 10*3/uL (ref 0.7–4.0)
MCH: 32.7 pg (ref 26.0–34.0)
MCHC: 35 g/dL (ref 30.0–36.0)
MCV: 93.6 fL (ref 80.0–100.0)
MONO ABS: 1.1 10*3/uL — AB (ref 0.1–1.0)
Monocytes Relative: 16 %
NEUTROS PCT: 48 %
Neutro Abs: 3.2 10*3/uL (ref 1.7–7.7)
PLATELETS: 163 10*3/uL (ref 150–400)
RBC: 4.4 MIL/uL (ref 4.22–5.81)
RDW: 13.2 % (ref 11.5–15.5)
WBC: 6.6 10*3/uL (ref 4.0–10.5)
nRBC: 0 % (ref 0.0–0.2)

## 2018-03-09 LAB — COMPREHENSIVE METABOLIC PANEL
ALK PHOS: 77 U/L (ref 38–126)
ALT: 26 U/L (ref 0–44)
ANION GAP: 5 (ref 5–15)
AST: 22 U/L (ref 15–41)
Albumin: 4.1 g/dL (ref 3.5–5.0)
BUN: 12 mg/dL (ref 6–20)
CALCIUM: 9.2 mg/dL (ref 8.9–10.3)
CHLORIDE: 108 mmol/L (ref 98–111)
CO2: 25 mmol/L (ref 22–32)
CREATININE: 0.86 mg/dL (ref 0.61–1.24)
GFR calc Af Amer: >60 mL/min (ref >60)
GFR calc non Af Amer: >60 mL/min (ref >60)
GLUCOSE: 114 mg/dL — AB (ref 70–99)
Potassium: 4.1 mmol/L (ref 3.5–5.1)
SODIUM: 138 mmol/L (ref 135–145)
Total Bilirubin: 0.4 mg/dL (ref 0.3–1.2)
Total Protein: 7.3 g/dL (ref 6.5–8.1)

## 2018-03-09 MED ORDER — OXALIPLATIN CHEMO INJECTION 100 MG/20ML
85.0000 mg/m2 | Freq: Once | INTRAVENOUS | Status: AC
  Administered 2018-03-09: 175 mg via INTRAVENOUS
  Filled 2018-03-09: qty 35

## 2018-03-09 MED ORDER — FLUOROURACIL CHEMO INJECTION 2.5 GM/50ML
400.0000 mg/m2 | Freq: Once | INTRAVENOUS | Status: AC
  Administered 2018-03-09: 800 mg via INTRAVENOUS
  Filled 2018-03-09: qty 16

## 2018-03-09 MED ORDER — DEXTROSE 5 % IV SOLN
Freq: Once | INTRAVENOUS | Status: AC
  Administered 2018-03-09: 09:00:00 via INTRAVENOUS
  Filled 2018-03-09: qty 250

## 2018-03-09 MED ORDER — DEXAMETHASONE SODIUM PHOSPHATE 10 MG/ML IJ SOLN
10.0000 mg | Freq: Once | INTRAMUSCULAR | Status: AC
  Administered 2018-03-09: 10 mg via INTRAVENOUS
  Filled 2018-03-09: qty 1

## 2018-03-09 MED ORDER — PALONOSETRON HCL INJECTION 0.25 MG/5ML
0.2500 mg | Freq: Once | INTRAVENOUS | Status: AC
  Administered 2018-03-09: 0.25 mg via INTRAVENOUS
  Filled 2018-03-09: qty 5

## 2018-03-09 MED ORDER — LEUCOVORIN CALCIUM INJECTION 350 MG
800.0000 mg | Freq: Once | INTRAVENOUS | Status: AC
  Administered 2018-03-09: 800 mg via INTRAVENOUS
  Filled 2018-03-09: qty 25

## 2018-03-09 MED ORDER — SODIUM CHLORIDE 0.9 % IV SOLN
2400.0000 mg/m2 | INTRAVENOUS | Status: DC
  Administered 2018-03-09: 4900 mg via INTRAVENOUS
  Filled 2018-03-09: qty 98

## 2018-03-09 NOTE — Progress Notes (Signed)
Patient here for follow up. Pt has discoloration in lower extremities after second treatment. Site very itchy and hot.

## 2018-03-09 NOTE — Progress Notes (Signed)
Hematology/Oncology follow-up note [facility] Telephone:([phone] Fax:([phone]) [phone]   Patient Care Team: Katheren Shams as PCP - General [name], RN as Registered Nurse  REFERRING PROVIDER: Dr.Thomas CHIEF COMPLAINTS/REASON FOR VISIT:  Follow-up for treatment of rectal cancer, assessment of tolerability of chemotherapy.  HISTORY OF PRESENTING ILLNESS:  Jeffery Mccormick is a  52 y.o.  male with PMH listed below who was referred to me for evaluation of rectal cancer.   He was evaluated by surgeon for evaluation of chronic rectal bleeding, ongoing chronic hemorrhoids.  12/25/2017 colonoscopy : revealed anal mass, 5-6 cm from the anal verge.  - Diverticulosis in the sigmoid colon. Biopsied. - A few 1 mm, non-bleeding polyps in the sigmoid colon, removed with a cold biopsy forceps. Resected and retrieved. Biopsied.  Biopsy showed  A. COLON POLYP, SIGMOID; COLD BIOPSY: - HYPERPLASTIC POLYP.  - NEGATIVE FOR DYSPLASIA AND MALIGNANCY.   B. RECTUM POLYP; COLD BIOPSY: - HYPERPLASTIC POLYP.  - NEGATIVE FOR DYSPLASIA AND MALIGNANCY.   C. RECTAL MASS; HOT SNARE:  - INVASIVE ADENOCARCINOMA, MODERATELY DIFFERENTIATED, ULCERATED.   # CT chest abdomen pelvis 01/16/2018 showed Wall thickening involving the lower rectum, corresponding to the patient's known rectal cancer. No findings specific for metastatic disease.  Scattered small bilateral pulmonary nodules measuring up to 3 mm, nonspecific but not considered overly suspicious for metastatic disease.   INTERVAL HISTORY Jeffery Mccormick is a 52 y.o. male who has above history reviewed by me today presents for assessment prior to cycle 3 neoadjuvant chemotherapy for rectal cancer cT3 N0 M0.  #Reports that her rectal pain has improved. Denies any diarrhea, pain with passing bowel movement. No additional episodes of rectal bleeding. #Is concerned that his groin area/scrotum color gets  darker. Appetite is good   Review of Systems  Constitutional: Negative for chills, fever, malaise/fatigue and weight loss.  HENT: Negative for nosebleeds and sore throat.   Eyes: Negative for double vision, photophobia and redness.  Respiratory: Negative for cough, shortness of breath and wheezing.   Cardiovascular: Negative for chest pain, palpitations and orthopnea.  Gastrointestinal: Negative for abdominal pain, blood in stool, nausea and vomiting.  Genitourinary: Negative for dysuria.       Scrotum/groin skin gets darker  Musculoskeletal: Negative for back pain, myalgias and neck pain.  Skin: Negative for itching and rash.  Neurological: Negative for dizziness, tingling and tremors.  Endo/Heme/Allergies: Negative for environmental allergies. Does not bruise/bleed easily.  Psychiatric/Behavioral: Negative for depression.    MEDICAL HISTORY:  Past Medical History:  Diagnosis Date  . Hemorrhoid prolapse     SURGICAL HISTORY: Past Surgical History:  Procedure Laterality Date  . COLONOSCOPY WITH PROPOFOL N/A 12/25/2017   Procedure: COLONOSCOPY WITH PROPOFOL;  Surgeon: Benjamine Sprague, DO;  Location: [place] ENDOSCOPY;  Service: General;  Laterality: N/A;  . HERNIA REPAIR  07/2013   Right Inguinal-Dr. Burt Knack  . PORTACATH PLACEMENT Right 02/03/2018   Procedure: INSERTION PORT-A-CATH;  Surgeon: Benjamine Sprague, DO;  Location: [facility] ORS;  Service: General;  Laterality: Right;    SOCIAL HISTORY: Social History   Socioeconomic History  . Marital status: Married    Spouse name: Not on file  . Number of children: Not on file  . Years of education: Not on file  . Highest education level: Not on file  Occupational History  . Occupation: unemployed  Social Needs  . Financial resource strain: Not on file  . Food insecurity:    Worry: Not on file  Inability: Not on file  . Transportation needs:    Medical: Not on file    Non-medical: Not on file  Tobacco Use  . Smoking status:  Current Every Day Smoker    Packs/day: 1.00    Years: 30.00    Pack years: 30.00    Types: Cigarettes  . Smokeless tobacco: Never Used  Substance and Sexual Activity  . Alcohol use: Yes    Frequency: Never    Comment: occasionally  . Drug use: No  . Sexual activity: Not on file  Lifestyle  . Physical activity:    Days per week: Not on file    Minutes per session: Not on file  . Stress: Not on file  Relationships  . Social connections:    Talks on phone: Not on file    Gets together: Not on file    Attends religious service: Not on file    Active member of club or organization: Not on file    Attends meetings of clubs or organizations: Not on file    Relationship status: Not on file  . Intimate partner violence:    Fear of current or ex partner: Not on file    Emotionally abused: Not on file    Physically abused: Not on file    Forced sexual activity: Not on file  Other Topics Concern  . Not on file  Social History Narrative  . Not on file    FAMILY HISTORY: Family History  Family history unknown: Yes  patient reports that he does not know any family history.   ALLERGIES:  has No Known Allergies.  MEDICATIONS:  Current Outpatient Medications  Medication Sig Dispense Refill  . HYDROcodone-acetaminophen (NORCO) 5-325 MG tablet Take 1 tablet by mouth every 6 (six) hours as needed for up to 5 doses for moderate pain. 5 tablet 0  . lidocaine-prilocaine (EMLA) cream Apply to affected area once 30 g 3  . loperamide (IMODIUM) 2 MG capsule Take 1 capsule (2 mg total) by mouth See admin instructions. With onset of loose stool, take 4mg  followed by 2mg  every 2 hours until 12 hours have passed without loose bowel movement. Maximum: 16 mg/day 120 capsule 1  . nystatin (MYCOSTATIN/NYSTOP) powder Apply topically 4 (four) times daily. 15 g 0  . ondansetron (ZOFRAN) 8 MG tablet Take 1 tablet (8 mg total) by mouth 2 (two) times daily as needed for refractory nausea / vomiting. Start on  day 3 after chemotherapy. 30 tablet 1  . polyethylene glycol (MIRALAX / GLYCOLAX) packet Take 17 g by mouth daily as needed for moderate constipation.     . prochlorperazine (COMPAZINE) 10 MG tablet Take 1 tablet (10 mg total) by mouth every 6 (six) hours as needed (Nausea or vomiting). 30 tablet 1  . ibuprofen (ADVIL,MOTRIN) 800 MG tablet Take 1 tablet (800 mg total) by mouth every 8 (eight) hours as needed for mild pain or moderate pain. (Patient not taking: Reported on 02/23/2018) 30 tablet 0   No current facility-administered medications for this visit.    Facility-Administered Medications Ordered in Other Visits  Medication Dose Route Frequency Provider Last Rate Last Dose  . fluorouracil (ADRUCIL) 4,900 mg in sodium chloride 0.9 % 52 mL chemo infusion  2,400 mg/m2 (Treatment Plan Recorded) Intravenous 1 day or 1 dose Earlie Server, MD   4,900 mg at 03/09/18 1213   RADIOGRAPHIC STUDIES: I have personally reviewed the radiological images as listed and agreed with the findings in the report. 01/23/2018 MRI  pelvis with and without contrast Low rectal/anorectal adenocarcinoma T stage:  Early T3 Rectal adenocarcinoma N stage:  N0 Distance from tumor to the anal sphincter is minimal. The tumor extends to or just above the insertion of the external sphincter.  PHYSICAL EXAMINATION: ECOG PERFORMANCE STATUS: 1 - Symptomatic but completely ambulatory Vitals:   03/09/18 0830  BP: (!) 143/91  Pulse: 71  Resp: 18  Temp: (!) 96.3 F (35.7 C)   Filed Weights   03/09/18 0830  Weight: 185 lb 3.2 oz (84 kg)    Physical Exam  Constitutional: He is oriented to person, place, and time. No distress.  HENT:  Head: Normocephalic and atraumatic.  Mouth/Throat: Oropharynx is clear and moist.  Eyes: Pupils are equal, round, and reactive to light. EOM are normal. No scleral icterus.  Neck: Normal range of motion. Neck supple.  Cardiovascular: Normal rate, regular rhythm and normal heart sounds.   Pulmonary/Chest: Effort normal. No respiratory distress. He has no wheezes.  Abdominal: Soft. Bowel sounds are normal. He exhibits no distension and no mass. There is no tenderness.  Genitourinary:  Genitourinary Comments: Groin and scrotum tann color hyperpigmentation.   Musculoskeletal: Normal range of motion. He exhibits no edema or deformity.  Neurological: He is alert and oriented to person, place, and time. No cranial nerve deficit. Coordination normal.  Skin: Skin is warm and dry. No rash noted. No erythema.  Psychiatric: He has a normal mood and affect. His behavior is normal. Thought content normal.     LABORATORY DATA:  I have reviewed the data as listed Lab Results  Component Value Date   WBC 6.6 03/09/2018   HGB 14.4 03/09/2018   HCT 41.2 03/09/2018   MCV 93.6 03/09/2018   PLT 163 03/09/2018   Recent Labs    02/16/18 0939 02/23/18 0817 03/09/18 0810  NA 136 137 138  K 4.0 4.0 4.1  CL 105 107 108  CO2 22 23 25   GLUCOSE 118* 98 114*  BUN 10 15 12   CREATININE 0.86 0.89 0.86  CALCIUM 9.1 9.2 9.2  GFRNONAA >60 >60 >60  GFRAA >60 >60 >60  PROT 7.1 7.4 7.3  ALBUMIN 4.2 4.2 4.1  AST 17 27 22   ALT 22 35 26  ALKPHOS 69 74 77  BILITOT 0.5 0.4 0.4   Iron/TIBC/Ferritin/ %Sat No results found for: IRON, TIBC, FERRITIN, IRONPCTSAT   01/29/2018 baseline CEA 11.6   ASSESSMENT & PLAN:  1. Rectal cancer ([place])   2. Neuropathy due to chemotherapeutic drug ([place])   3. Encounter for antineoplastic chemotherapy   4. Tinea cruris   Cancer Staging Rectal cancer [facility]) Staging form: Colon and Rectum, AJCC 8th Edition - Clinical stage from 01/29/2018: Stage IIA (cT3, cN0, cM0) - Signed by Earlie Server, MD on 02/01/2018  #Low rectal cancer, plan total neoadjuvant therapy.chemotherapy regimen with FOLFOX Q2w x 4 months, followed by concurrent Xeloda with RT,  Labs reviewed and discussed with patient.  Counts acceptable to proceed with cycle 3 FOLFOX today.  #Rectal pain has  improved. 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  Continue monitor. # Tinea Cruris, advised patient to use loose underwear.  Patient can use topical antifungals.   We spent sufficient time to discuss many aspect of care, questions were answered to patient's satisfaction. The patient knows to call the clinic with any problems questions or concerns.  Return of visit: 2 weeks for assessment prior to cycle 3 chemotherapy.   Earlie Server, MD, PhD Hematology Oncology [facility]  at [place]- [phone] 03/09/2018

## 2018-03-10 LAB — CEA: CEA1: 7.7 ng/mL — AB (ref 0.0–4.7)

## 2018-03-11 ENCOUNTER — Inpatient Hospital Stay: Payer: Self-pay

## 2018-03-11 DIAGNOSIS — C20 Malignant neoplasm of rectum: Secondary | ICD-10-CM

## 2018-03-11 MED ORDER — HEPARIN SOD (PORK) LOCK FLUSH 100 UNIT/ML IV SOLN
INTRAVENOUS | Status: AC
  Filled 2018-03-11: qty 5

## 2018-03-11 MED ORDER — SODIUM CHLORIDE 0.9% FLUSH
10.0000 mL | INTRAVENOUS | Status: DC | PRN
  Administered 2018-03-11: 10 mL
  Filled 2018-03-11: qty 10

## 2018-03-11 MED ORDER — HEPARIN SOD (PORK) LOCK FLUSH 100 UNIT/ML IV SOLN
500.0000 [IU] | Freq: Once | INTRAVENOUS | Status: AC | PRN
  Administered 2018-03-11: 500 [IU]

## 2018-03-23 ENCOUNTER — Inpatient Hospital Stay: Payer: Self-pay

## 2018-03-23 ENCOUNTER — Other Ambulatory Visit: Payer: Self-pay

## 2018-03-23 ENCOUNTER — Inpatient Hospital Stay (HOSPITAL_BASED_OUTPATIENT_CLINIC_OR_DEPARTMENT_OTHER): Payer: Self-pay | Admitting: Oncology

## 2018-03-23 ENCOUNTER — Encounter: Payer: Self-pay | Admitting: Oncology

## 2018-03-23 VITALS — BP 128/81 | HR 64 | Temp 95.3°F | Resp 16 | Ht 70.0 in | Wt 182.0 lb

## 2018-03-23 DIAGNOSIS — C20 Malignant neoplasm of rectum: Secondary | ICD-10-CM

## 2018-03-23 DIAGNOSIS — F17210 Nicotine dependence, cigarettes, uncomplicated: Secondary | ICD-10-CM

## 2018-03-23 DIAGNOSIS — B356 Tinea cruris: Secondary | ICD-10-CM

## 2018-03-23 DIAGNOSIS — G620 Drug-induced polyneuropathy: Secondary | ICD-10-CM

## 2018-03-23 DIAGNOSIS — Z5111 Encounter for antineoplastic chemotherapy: Secondary | ICD-10-CM

## 2018-03-23 DIAGNOSIS — T451X5A Adverse effect of antineoplastic and immunosuppressive drugs, initial encounter: Secondary | ICD-10-CM

## 2018-03-23 DIAGNOSIS — R918 Other nonspecific abnormal finding of lung field: Secondary | ICD-10-CM

## 2018-03-23 LAB — COMPREHENSIVE METABOLIC PANEL
ALT: 24 U/L (ref 0–44)
AST: 22 U/L (ref 15–41)
Albumin: 4.1 g/dL (ref 3.5–5.0)
Alkaline Phosphatase: 74 U/L (ref 38–126)
Anion gap: 6 (ref 5–15)
BUN: 11 mg/dL (ref 6–20)
CALCIUM: 9.1 mg/dL (ref 8.9–10.3)
CO2: 24 mmol/L (ref 22–32)
Chloride: 108 mmol/L (ref 98–111)
Creatinine, Ser: 0.87 mg/dL (ref 0.61–1.24)
GFR calc Af Amer: >60 mL/min (ref >60)
GFR calc non Af Amer: >60 mL/min (ref >60)
Glucose, Bld: 109 mg/dL — ABNORMAL HIGH (ref 70–99)
Potassium: 4.1 mmol/L (ref 3.5–5.1)
Sodium: 138 mmol/L (ref 135–145)
Total Bilirubin: 0.6 mg/dL (ref 0.3–1.2)
Total Protein: 7.3 g/dL (ref 6.5–8.1)

## 2018-03-23 LAB — CBC WITH DIFFERENTIAL/PLATELET
Abs Immature Granulocytes: 0 10*3/uL (ref 0.00–0.07)
Basophils Absolute: 0 10*3/uL (ref 0.0–0.1)
Basophils Relative: 1 %
Eosinophils Absolute: 0.1 10*3/uL (ref 0.0–0.5)
Eosinophils Relative: 1 %
HCT: 39.6 % (ref 39.0–52.0)
Hemoglobin: 13.7 g/dL (ref 13.0–17.0)
Immature Granulocytes: 0 %
Lymphocytes Relative: 32 %
Lymphs Abs: 1.9 10*3/uL (ref 0.7–4.0)
MCH: 32.3 pg (ref 26.0–34.0)
MCHC: 34.6 g/dL (ref 30.0–36.0)
MCV: 93.4 fL (ref 80.0–100.0)
MONO ABS: 0.9 10*3/uL (ref 0.1–1.0)
Monocytes Relative: 15 %
Neutro Abs: 3.1 10*3/uL (ref 1.7–7.7)
Neutrophils Relative %: 51 %
Platelets: 159 10*3/uL (ref 150–400)
RBC: 4.24 MIL/uL (ref 4.22–5.81)
RDW: 14.3 % (ref 11.5–15.5)
WBC: 6 10*3/uL (ref 4.0–10.5)
nRBC: 0 % (ref 0.0–0.2)

## 2018-03-23 MED ORDER — SODIUM CHLORIDE 0.9 % IV SOLN
2400.0000 mg/m2 | INTRAVENOUS | Status: DC
  Administered 2018-03-23: 4900 mg via INTRAVENOUS
  Filled 2018-03-23: qty 98

## 2018-03-23 MED ORDER — PALONOSETRON HCL INJECTION 0.25 MG/5ML
0.2500 mg | Freq: Once | INTRAVENOUS | Status: AC
  Administered 2018-03-23: 0.25 mg via INTRAVENOUS
  Filled 2018-03-23: qty 5

## 2018-03-23 MED ORDER — SODIUM CHLORIDE 0.9 % IV SOLN: 10.0000 mg | Freq: Once | INTRAVENOUS | Status: DC

## 2018-03-23 MED ORDER — CLOTRIMAZOLE-BETAMETHASONE 1-0.05 % EX CREA: 1.0000 "application " | TOPICAL_CREAM | Freq: Two times a day (BID) | CUTANEOUS | 0 refills | Status: DC

## 2018-03-23 MED ORDER — OXALIPLATIN CHEMO INJECTION 100 MG/20ML
85.0000 mg/m2 | Freq: Once | INTRAVENOUS | Status: AC
  Administered 2018-03-23: 175 mg via INTRAVENOUS
  Filled 2018-03-23: qty 35

## 2018-03-23 MED ORDER — LEUCOVORIN CALCIUM INJECTION 350 MG
800.0000 mg | Freq: Once | INTRAVENOUS | Status: AC
  Administered 2018-03-23: 800 mg via INTRAVENOUS
  Filled 2018-03-23: qty 25

## 2018-03-23 MED ORDER — FLUOROURACIL CHEMO INJECTION 2.5 GM/50ML
400.0000 mg/m2 | Freq: Once | INTRAVENOUS | Status: AC
  Administered 2018-03-23: 800 mg via INTRAVENOUS
  Filled 2018-03-23: qty 16

## 2018-03-23 MED ORDER — DEXTROSE 5 % IV SOLN
Freq: Once | INTRAVENOUS | Status: AC
  Administered 2018-03-23: 09:00:00 via INTRAVENOUS
  Filled 2018-03-23: qty 250

## 2018-03-23 MED ORDER — DEXAMETHASONE SODIUM PHOSPHATE 10 MG/ML IJ SOLN
10.0000 mg | Freq: Once | INTRAMUSCULAR | Status: AC
  Administered 2018-03-23: 10 mg via INTRAVENOUS
  Filled 2018-03-23: qty 1

## 2018-03-23 NOTE — Progress Notes (Signed)
Hematology/Oncology follow-up note [facility] Telephone:([phone] Fax:(336) [phone]   Patient Care Team: Katheren Shams as PCP - General [name], RN as Registered Nurse  REFERRING PROVIDER: Dr.Thomas CHIEF COMPLAINTS/REASON FOR VISIT:  Follow-up for treatment of rectal cancer, assessment of tolerability of chemotherapy.  HISTORY OF PRESENTING ILLNESS:  Jeffery Mccormick is a  52 y.o.  male with PMH listed below who was referred to me for evaluation of rectal cancer.   He was evaluated by surgeon for evaluation of chronic rectal bleeding, ongoing chronic hemorrhoids.  12/25/2017 colonoscopy : revealed anal mass, 5-6 cm from the anal verge.  - Diverticulosis in the sigmoid colon. Biopsied. - A few 1 mm, non-bleeding polyps in the sigmoid colon, removed with a cold biopsy forceps. Resected and retrieved. Biopsied.  Biopsy showed  A. COLON POLYP, SIGMOID; COLD BIOPSY: - HYPERPLASTIC POLYP.  - NEGATIVE FOR DYSPLASIA AND MALIGNANCY.   B. RECTUM POLYP; COLD BIOPSY: - HYPERPLASTIC POLYP.  - NEGATIVE FOR DYSPLASIA AND MALIGNANCY.   C. RECTAL MASS; HOT SNARE:  - INVASIVE ADENOCARCINOMA, MODERATELY DIFFERENTIATED, ULCERATED.   # CT chest abdomen pelvis 01/16/2018 showed Wall thickening involving the lower rectum, corresponding to the patient's known rectal cancer. No findings specific for metastatic disease.  Scattered small bilateral pulmonary nodules measuring up to 3 mm, nonspecific but not considered overly suspicious for metastatic disease.   INTERVAL HISTORY Jeffery Mccormick is a 52 y.o. male who has above history reviewed by me today presents for assessment prior to cycle for neoadjuvant chemotherapy for rectal cancer cT3 N0 M0. #Reports that her rectal pain has improved, no more rectal discharges or discomfort.  No additional episodes of rectal bleeding. #Reports having normal bowel movement except 1 or 2 loose stools a few days  after the chemotherapy, spontaneously resolved. Appetite is good. He has minimal neuropathy, usually exacerbated when he touch cold objects.  Neuropathy lasted few days and went away #Reports scrotal area still dark and requests some prescription of topical treatments.   Review of Systems  Constitutional: Negative for chills, fever, malaise/fatigue and weight loss.  HENT: Negative for nosebleeds and sore throat.   Eyes: Negative for double vision, photophobia and redness.  Respiratory: Negative for cough, shortness of breath and wheezing.   Cardiovascular: Negative for chest pain, palpitations, orthopnea and leg swelling.  Gastrointestinal: Negative for abdominal pain, blood in stool, nausea and vomiting.  Genitourinary: Negative for dysuria.       Scrotum/groin skin gets darker  Musculoskeletal: Negative for back pain, myalgias and neck pain.  Skin: Negative for itching and rash.  Neurological: Negative for dizziness, tingling and tremors.  Endo/Heme/Allergies: Negative for environmental allergies. Does not bruise/bleed easily.  Psychiatric/Behavioral: Negative for depression and hallucinations.    MEDICAL HISTORY:  Past Medical History:  Diagnosis Date  . Hemorrhoid prolapse     SURGICAL HISTORY: Past Surgical History:  Procedure Laterality Date  . COLONOSCOPY WITH PROPOFOL N/A 12/25/2017   Procedure: COLONOSCOPY WITH PROPOFOL;  Surgeon: Benjamine Sprague, DO;  Location: [place] ENDOSCOPY;  Service: General;  Laterality: N/A;  . HERNIA REPAIR  07/2013   Right Inguinal-Dr. Burt Knack  . PORTACATH PLACEMENT Right 02/03/2018   Procedure: INSERTION PORT-A-CATH;  Surgeon: Benjamine Sprague, DO;  Location: [facility] ORS;  Service: General;  Laterality: Right;    SOCIAL HISTORY: Social History   Socioeconomic History  . Marital status: Married    Spouse name: Not on file  . Number of children: Not on file  . Years of education:  Not on file  . Highest education level: Not on file  Occupational  History  . Occupation: unemployed  Social Needs  . Financial resource strain: Not on file  . Food insecurity:    Worry: Not on file    Inability: Not on file  . Transportation needs:    Medical: Not on file    Non-medical: Not on file  Tobacco Use  . Smoking status: Current Every Day Smoker    Packs/day: 1.00    Years: 30.00    Pack years: 30.00    Types: Cigarettes  . Smokeless tobacco: Never Used  Substance and Sexual Activity  . Alcohol use: Yes    Frequency: Never    Comment: occasionally  . Drug use: No  . Sexual activity: Not on file  Lifestyle  . Physical activity:    Days per week: Not on file    Minutes per session: Not on file  . Stress: Not on file  Relationships  . Social connections:    Talks on phone: Not on file    Gets together: Not on file    Attends religious service: Not on file    Active member of club or organization: Not on file    Attends meetings of clubs or organizations: Not on file    Relationship status: Not on file  . Intimate partner violence:    Fear of current or ex partner: Not on file    Emotionally abused: Not on file    Physically abused: Not on file    Forced sexual activity: Not on file  Other Topics Concern  . Not on file  Social History Narrative  . Not on file    FAMILY HISTORY: Family History  Family history unknown: Yes  patient reports that he does not know any family history.   ALLERGIES:  has No Known Allergies.  MEDICATIONS:  Current Outpatient Medications  Medication Sig Dispense Refill  . HYDROcodone-acetaminophen (NORCO) 5-325 MG tablet Take 1 tablet by mouth every 6 (six) hours as needed for up to 5 doses for moderate pain. 5 tablet 0  . ibuprofen (ADVIL,MOTRIN) 800 MG tablet Take 1 tablet (800 mg total) by mouth every 8 (eight) hours as needed for mild pain or moderate pain. 30 tablet 0  . lidocaine-prilocaine (EMLA) cream Apply to affected area once 30 g 3  . loperamide (IMODIUM) 2 MG capsule Take 1  capsule (2 mg total) by mouth See admin instructions. With onset of loose stool, take 4mg  followed by 2mg  every 2 hours until 12 hours have passed without loose bowel movement. Maximum: 16 mg/day 120 capsule 1  . nystatin (MYCOSTATIN/NYSTOP) powder Apply topically 4 (four) times daily. 15 g 0  . ondansetron (ZOFRAN) 8 MG tablet Take 1 tablet (8 mg total) by mouth 2 (two) times daily as needed for refractory nausea / vomiting. Start on day 3 after chemotherapy. 30 tablet 1  . polyethylene glycol (MIRALAX / GLYCOLAX) packet Take 17 g by mouth daily as needed for moderate constipation.     . prochlorperazine (COMPAZINE) 10 MG tablet Take 1 tablet (10 mg total) by mouth every 6 (six) hours as needed (Nausea or vomiting). 30 tablet 1   No current facility-administered medications for this visit.    RADIOGRAPHIC STUDIES: I have personally reviewed the radiological images as listed and agreed with the findings in the report. 01/23/2018 MRI pelvis with and without contrast Low rectal/anorectal adenocarcinoma T stage:  Early T3 Rectal adenocarcinoma N stage:  N0 Distance from tumor to the anal sphincter is minimal. The tumor extends to or just above the insertion of the external sphincter.  PHYSICAL EXAMINATION: ECOG PERFORMANCE STATUS: 1 - Symptomatic but completely ambulatory Vitals:   03/23/18 0831  BP: 128/81  Pulse: 64  Resp: 16  Temp: (!) 95.3 F (35.2 C)   Filed Weights   03/23/18 0831  Weight: 182 lb (82.6 kg)    Physical Exam Constitutional:      General: He is not in acute distress. HENT:     Head: Normocephalic and atraumatic.  Eyes:     General: No scleral icterus.    Pupils: Pupils are equal, round, and reactive to light.  Neck:     Musculoskeletal: Normal range of motion and neck supple.  Cardiovascular:     Rate and Rhythm: Normal rate and regular rhythm.     Heart sounds: Normal heart sounds.  Pulmonary:     Effort: Pulmonary effort is normal. No respiratory  distress.     Breath sounds: No wheezing.  Abdominal:     General: Bowel sounds are normal. There is no distension.     Palpations: Abdomen is soft. There is no mass.     Tenderness: There is no abdominal tenderness.  Genitourinary:    Comments: Groin and scrotum tann color hyperpigmentation.  Musculoskeletal: Normal range of motion.        General: No deformity.  Skin:    General: Skin is warm and dry.     Findings: No erythema or rash.  Neurological:     Mental Status: He is alert and oriented to person, place, and time.     Cranial Nerves: No cranial nerve deficit.     Coordination: Coordination normal.  Psychiatric:        Behavior: Behavior normal.        Thought Content: Thought content normal.      LABORATORY DATA:  I have reviewed the data as listed Lab Results  Component Value Date   WBC 6.0 03/23/2018   HGB 13.7 03/23/2018   HCT 39.6 03/23/2018   MCV 93.4 03/23/2018   PLT 159 03/23/2018   Recent Labs    02/16/18 0939 02/23/18 0817 03/09/18 0810  NA 136 137 138  K 4.0 4.0 4.1  CL 105 107 108  CO2 22 23 25   GLUCOSE 118* 98 114*  BUN 10 15 12   CREATININE 0.86 0.89 0.86  CALCIUM 9.1 9.2 9.2  GFRNONAA >60 >60 >60  GFRAA >60 >60 >60  PROT 7.1 7.4 7.3  ALBUMIN 4.2 4.2 4.1  AST 17 27 22   ALT 22 35 26  ALKPHOS 69 74 77  BILITOT 0.5 0.4 0.4   Iron/TIBC/Ferritin/ %Sat No results found for: IRON, TIBC, FERRITIN, IRONPCTSAT   01/29/2018 baseline CEA 11.6   ASSESSMENT & PLAN:  1. Rectal cancer ([place])   2. Encounter for antineoplastic chemotherapy   3. Neuropathy due to chemotherapeutic drug ([place])   4. Tinea cruris   Cancer Staging Rectal cancer [facility]) Staging form: Colon and Rectum, AJCC 8th Edition - Clinical stage from 01/29/2018: Stage IIA (cT3, cN0, cM0) - Signed by Earlie Server, MD on 02/01/2018  #Low rectal cancer, plan total neoadjuvant therapy.chemotherapy regimen with FOLFOX Q2w x 4 months, followed by concurrent Xeloda with RT,  Labs reviewed  and discussed with patient. CEA is trending down.  Counts acceptable to proceed with cycle 4 FOLFOX today.  #Neuropathy, chemotherapy-induced, grade 1.  Discussed with patient that the neuropathy is  due to chemotherapy.  Recommend continue observe closely.  Avoid cold exposure. For now it is intermittent neuropathy.  If it becomes more persistent, will start patient on neuropathy treatment. Patient is also aware of potential possibilities of permanent neuropathy.  # Tinea Cruris, advised patient to use loose underwear.  Rx of topical Clotrimazole sent to pharmacy.  We spent sufficient time to discuss many aspect of care, questions were answered to patient's satisfaction.  The patient knows to call the clinic with any problems questions or concerns.  Return of visit:2 weeks.    Earlie Server, MD, PhD Hematology Oncology [facility] Pager- [phone] 03/23/2018

## 2018-03-24 LAB — CEA: CEA: 6.4 ng/mL — ABNORMAL HIGH (ref 0.0–4.7)

## 2018-03-25 ENCOUNTER — Inpatient Hospital Stay: Payer: Self-pay

## 2018-03-25 DIAGNOSIS — C20 Malignant neoplasm of rectum: Secondary | ICD-10-CM

## 2018-03-25 MED ORDER — SODIUM CHLORIDE 0.9% FLUSH
10.0000 mL | INTRAVENOUS | Status: DC | PRN
  Administered 2018-03-25: 10 mL
  Filled 2018-03-25: qty 10

## 2018-03-25 MED ORDER — HEPARIN SOD (PORK) LOCK FLUSH 100 UNIT/ML IV SOLN
500.0000 [IU] | Freq: Once | INTRAVENOUS | Status: AC | PRN
  Administered 2018-03-25: 500 [IU]
  Filled 2018-03-25: qty 5

## 2018-04-06 ENCOUNTER — Encounter: Payer: Self-pay | Admitting: Oncology

## 2018-04-06 ENCOUNTER — Other Ambulatory Visit: Payer: Self-pay

## 2018-04-06 ENCOUNTER — Inpatient Hospital Stay: Payer: Self-pay

## 2018-04-06 ENCOUNTER — Inpatient Hospital Stay (HOSPITAL_BASED_OUTPATIENT_CLINIC_OR_DEPARTMENT_OTHER): Payer: Self-pay | Admitting: Oncology

## 2018-04-06 VITALS — BP 129/87 | HR 68 | Temp 96.9°F | Resp 18 | Wt 181.7 lb

## 2018-04-06 DIAGNOSIS — C20 Malignant neoplasm of rectum: Secondary | ICD-10-CM

## 2018-04-06 DIAGNOSIS — T451X5A Adverse effect of antineoplastic and immunosuppressive drugs, initial encounter: Secondary | ICD-10-CM

## 2018-04-06 DIAGNOSIS — Z5111 Encounter for antineoplastic chemotherapy: Secondary | ICD-10-CM

## 2018-04-06 DIAGNOSIS — R918 Other nonspecific abnormal finding of lung field: Secondary | ICD-10-CM

## 2018-04-06 DIAGNOSIS — B356 Tinea cruris: Secondary | ICD-10-CM

## 2018-04-06 DIAGNOSIS — G620 Drug-induced polyneuropathy: Secondary | ICD-10-CM

## 2018-04-06 DIAGNOSIS — F17210 Nicotine dependence, cigarettes, uncomplicated: Secondary | ICD-10-CM

## 2018-04-06 LAB — CBC WITH DIFFERENTIAL/PLATELET
Abs Immature Granulocytes: 0.01 10*3/uL (ref 0.00–0.07)
Basophils Absolute: 0 10*3/uL (ref 0.0–0.1)
Basophils Relative: 1 %
Eosinophils Absolute: 0.1 10*3/uL (ref 0.0–0.5)
Eosinophils Relative: 1 %
HEMATOCRIT: 38.4 % — AB (ref 39.0–52.0)
Hemoglobin: 13.3 g/dL (ref 13.0–17.0)
Immature Granulocytes: 0 %
LYMPHS ABS: 1.9 10*3/uL (ref 0.7–4.0)
Lymphocytes Relative: 37 %
MCH: 33 pg (ref 26.0–34.0)
MCHC: 34.6 g/dL (ref 30.0–36.0)
MCV: 95.3 fL (ref 80.0–100.0)
Monocytes Absolute: 0.8 10*3/uL (ref 0.1–1.0)
Monocytes Relative: 16 %
Neutro Abs: 2.3 10*3/uL (ref 1.7–7.7)
Neutrophils Relative %: 45 %
Platelets: 146 10*3/uL — ABNORMAL LOW (ref 150–400)
RBC: 4.03 MIL/uL — ABNORMAL LOW (ref 4.22–5.81)
RDW: 15.5 % (ref 11.5–15.5)
WBC: 5.2 10*3/uL (ref 4.0–10.5)
nRBC: 0 % (ref 0.0–0.2)

## 2018-04-06 LAB — COMPREHENSIVE METABOLIC PANEL
ALT: 30 U/L (ref 0–44)
AST: 26 U/L (ref 15–41)
Albumin: 3.9 g/dL (ref 3.5–5.0)
Alkaline Phosphatase: 74 U/L (ref 38–126)
Anion gap: 8 (ref 5–15)
BUN: 11 mg/dL (ref 6–20)
CHLORIDE: 107 mmol/L (ref 98–111)
CO2: 24 mmol/L (ref 22–32)
Calcium: 9.3 mg/dL (ref 8.9–10.3)
Creatinine, Ser: 0.83 mg/dL (ref 0.61–1.24)
GFR calc Af Amer: >60 mL/min (ref >60)
GFR calc non Af Amer: >60 mL/min (ref >60)
Glucose, Bld: 106 mg/dL — ABNORMAL HIGH (ref 70–99)
Potassium: 4.1 mmol/L (ref 3.5–5.1)
Sodium: 139 mmol/L (ref 135–145)
Total Bilirubin: 0.6 mg/dL (ref 0.3–1.2)
Total Protein: 7.1 g/dL (ref 6.5–8.1)

## 2018-04-06 MED ORDER — SODIUM CHLORIDE 0.9% FLUSH
10.0000 mL | INTRAVENOUS | Status: DC | PRN
  Administered 2018-04-06: 10 mL
  Filled 2018-04-06: qty 10

## 2018-04-06 MED ORDER — DEXTROSE 5 % IV SOLN
Freq: Once | INTRAVENOUS | Status: AC
  Administered 2018-04-06: 09:00:00 via INTRAVENOUS
  Filled 2018-04-06: qty 250

## 2018-04-06 MED ORDER — FLUOROURACIL CHEMO INJECTION 2.5 GM/50ML
400.0000 mg/m2 | Freq: Once | INTRAVENOUS | Status: AC
  Administered 2018-04-06: 800 mg via INTRAVENOUS
  Filled 2018-04-06: qty 16

## 2018-04-06 MED ORDER — PALONOSETRON HCL INJECTION 0.25 MG/5ML
0.2500 mg | Freq: Once | INTRAVENOUS | Status: AC
  Administered 2018-04-06: 0.25 mg via INTRAVENOUS
  Filled 2018-04-06: qty 5

## 2018-04-06 MED ORDER — DEXAMETHASONE SODIUM PHOSPHATE 10 MG/ML IJ SOLN
10.0000 mg | Freq: Once | INTRAMUSCULAR | Status: AC
  Administered 2018-04-06: 10 mg via INTRAVENOUS
  Filled 2018-04-06: qty 1

## 2018-04-06 MED ORDER — SODIUM CHLORIDE 0.9 % IV SOLN
2400.0000 mg/m2 | INTRAVENOUS | Status: DC
  Administered 2018-04-06: 4900 mg via INTRAVENOUS
  Filled 2018-04-06: qty 98

## 2018-04-06 MED ORDER — SODIUM CHLORIDE 0.9 % IV SOLN: 10.0000 mg | Freq: Once | INTRAVENOUS | Status: DC

## 2018-04-06 MED ORDER — LEUCOVORIN CALCIUM INJECTION 350 MG
800.0000 mg | Freq: Once | INTRAVENOUS | Status: AC
  Administered 2018-04-06: 800 mg via INTRAVENOUS
  Filled 2018-04-06: qty 25

## 2018-04-06 MED ORDER — OXALIPLATIN CHEMO INJECTION 100 MG/20ML
85.0000 mg/m2 | Freq: Once | INTRAVENOUS | Status: AC
  Administered 2018-04-06: 175 mg via INTRAVENOUS
  Filled 2018-04-06: qty 35

## 2018-04-06 NOTE — Progress Notes (Signed)
Patient here for follow up

## 2018-04-06 NOTE — Progress Notes (Signed)
Hematology/Oncology follow-up note [facility] Telephone:([phone] [phone]   Patient Care Team: Jeffery Mccormick as PCP - General Jeffery Jacks, RN as Registered Nurse  REFERRING PROVIDER: Dr.Thomas CHIEF COMPLAINTS/REASON FOR VISIT:  Follow-up for treatment of rectal cancer, assessment of tolerability of chemotherapy.  HISTORY OF PRESENTING ILLNESS:  Jeffery Mccormick is a  52 y.o.  male with PMH listed below who was referred to me for evaluation of rectal cancer.   He was evaluated by surgeon for evaluation of chronic rectal bleeding, ongoing chronic hemorrhoids.  12/25/2017 colonoscopy : revealed anal mass, 5-6 cm from the anal verge.  - Diverticulosis in the sigmoid colon. Biopsied. - A few 1 mm, non-bleeding polyps in the sigmoid colon, removed with a cold biopsy forceps. Resected and retrieved. Biopsied.  Biopsy showed  A. COLON POLYP, SIGMOID; COLD BIOPSY: - HYPERPLASTIC POLYP.  - NEGATIVE FOR DYSPLASIA AND MALIGNANCY.   B. RECTUM POLYP; COLD BIOPSY: - HYPERPLASTIC POLYP.  - NEGATIVE FOR DYSPLASIA AND MALIGNANCY.   C. RECTAL MASS; HOT SNARE:  - INVASIVE ADENOCARCINOMA, MODERATELY DIFFERENTIATED, ULCERATED.   # CT chest abdomen pelvis 01/16/2018 showed Wall thickening involving the lower rectum, corresponding to the patient's known rectal cancer. No findings specific for metastatic disease.  Scattered small bilateral pulmonary nodules measuring up to 3 mm, nonspecific but not considered overly suspicious for metastatic disease.   INTERVAL HISTORY Jeffery Mccormick is a 52 y.o. male who has above history reviewed by me today presents for assessment prior to cycle for neoadjuvant chemotherapy for rectal cancer cT3 N0 M0.  #Reports tolerating chemotherapy well.  His rectal pain has significantly improved.  No more rectal discharge or discomfort.  No additional episodes of rectal bleeding. He has been having normal bowel  movement.  Appetite is good. #Scrotum itchiness has also improved after using topical clotrimazole  No new complaints today.  Minimal transient neuropathy on fingertips.  No changed.  Review of Systems  Constitutional: Negative for chills, fever, malaise/fatigue and weight loss.  HENT: Negative for nosebleeds and sore throat.   Eyes: Negative for double vision, photophobia and redness.  Respiratory: Negative for cough, shortness of breath and wheezing.   Cardiovascular: Negative for chest pain, palpitations, orthopnea and leg swelling.  Gastrointestinal: Negative for abdominal pain, blood in stool, nausea and vomiting.  Genitourinary: Negative for dysuria.  Musculoskeletal: Negative for back pain, myalgias and neck pain.  Skin: Negative for itching and rash.  Neurological: Negative for dizziness, tingling and tremors.  Endo/Heme/Allergies: Negative for environmental allergies. Does not bruise/bleed easily.  Psychiatric/Behavioral: Negative for depression and hallucinations.    MEDICAL HISTORY:  Past Medical History:  Diagnosis Date  . Hemorrhoid prolapse     SURGICAL HISTORY: Past Surgical History:  Procedure Laterality Date  . COLONOSCOPY WITH PROPOFOL N/A 12/25/2017   Procedure: COLONOSCOPY WITH PROPOFOL;  Surgeon: Benjamine Sprague, DO;  Location: [facility];  Service: General;  Laterality: N/A;  . HERNIA REPAIR  07/2013   Right Inguinal-Dr. Burt Knack  . PORTACATH PLACEMENT Right 02/03/2018   Procedure: INSERTION PORT-A-CATH;  Surgeon: Benjamine Sprague, DO;  Location: [facility];  Service: General;  Laterality: Right;    SOCIAL HISTORY: Social History   Socioeconomic History  . Marital status: Married    Spouse name: Not on file  . Number of children: Not on file  . Years of education: Not on file  . Highest education level: Not on file  Occupational History  . Occupation: unemployed  Social Needs  . Financial  resource strain: Not on file  . Food insecurity:    Worry: Not  on file    Inability: Not on file  . Transportation needs:    Medical: Not on file    Non-medical: Not on file  Tobacco Use  . Smoking status: Current Every Day Smoker    Packs/day: 1.00    Years: 30.00    Pack years: 30.00    Types: Cigarettes  . Smokeless tobacco: Never Used  Substance and Sexual Activity  . Alcohol use: Yes    Frequency: Never    Comment: occasionally  . Drug use: No  . Sexual activity: Not on file  Lifestyle  . Physical activity:    Days per week: Not on file    Minutes per session: Not on file  . Stress: Not on file  Relationships  . Social connections:    Talks on phone: Not on file    Gets together: Not on file    Attends religious service: Not on file    Active member of club or organization: Not on file    Attends meetings of clubs or organizations: Not on file    Relationship status: Not on file  . Intimate partner violence:    Fear of current or ex partner: Not on file    Emotionally abused: Not on file    Physically abused: Not on file    Forced sexual activity: Not on file  Other Topics Concern  . Not on file  Social History Narrative  . Not on file    FAMILY HISTORY: Family History  Family history unknown: Yes  patient reports that he does not know any family history.   ALLERGIES:  has No Known Allergies.  MEDICATIONS:  Current Outpatient Medications  Medication Sig Dispense Refill  . clotrimazole-betamethasone (LOTRISONE) cream Apply 1 application topically 2 (two) times daily. 30 g 0  . HYDROcodone-acetaminophen (NORCO) 5-325 MG tablet Take 1 tablet by mouth every 6 (six) hours as needed for up to 5 doses for moderate pain. 5 tablet 0  . ibuprofen (ADVIL,MOTRIN) 800 MG tablet Take 1 tablet (800 mg total) by mouth every 8 (eight) hours as needed for mild pain or moderate pain. 30 tablet 0  . lidocaine-prilocaine (EMLA) cream Apply to affected area once 30 g 3  . loperamide (IMODIUM) 2 MG capsule Take 1 capsule (2 mg total) by  mouth See admin instructions. With onset of loose stool, take 4mg  followed by 2mg  every 2 hours until 12 hours have passed without loose bowel movement. Maximum: 16 mg/day 120 capsule 1  . nystatin (MYCOSTATIN/NYSTOP) powder Apply topically 4 (four) times daily. 15 g 0  . ondansetron (ZOFRAN) 8 MG tablet Take 1 tablet (8 mg total) by mouth 2 (two) times daily as needed for refractory nausea / vomiting. Start on day 3 after chemotherapy. 30 tablet 1  . polyethylene glycol (MIRALAX / GLYCOLAX) packet Take 17 g by mouth daily as needed for moderate constipation.     . prochlorperazine (COMPAZINE) 10 MG tablet Take 1 tablet (10 mg total) by mouth every 6 (six) hours as needed (Nausea or vomiting). 30 tablet 1   No current facility-administered medications for this visit.    Facility-Administered Medications Ordered in Other Visits  Medication Dose Route Frequency Provider Last Rate Last Dose  . fluorouracil (ADRUCIL) 4,900 mg in sodium chloride 0.9 % 52 mL chemo infusion  2,400 mg/m2 (Treatment Plan Recorded) Intravenous 1 day or 1 dose Earlie Server, MD  4,900 mg at 04/06/18 1230  . sodium chloride flush (NS) 0.9 % injection 10 mL  10 mL Intracatheter PRN Earlie Server, MD   10 mL at 04/06/18 7096   RADIOGRAPHIC STUDIES: I have personally reviewed the radiological images as listed and agreed with the findings in the report. 01/23/2018 MRI pelvis with and without contrast Low rectal/anorectal adenocarcinoma T stage:  Early T3 Rectal adenocarcinoma N stage:  N0 Distance from tumor to the anal sphincter is minimal. The tumor extends to or just above the insertion of the external sphincter.  PHYSICAL EXAMINATION: ECOG PERFORMANCE STATUS: 1 - Symptomatic but completely ambulatory Vitals:   04/06/18 0843  BP: 129/87  Pulse: 68  Resp: 18  Temp: (!) 96.9 F (36.1 C)   Filed Weights   04/06/18 0843  Weight: 181 lb 11.2 oz (82.4 kg)    Physical Exam Constitutional:      General: He is not in acute  distress. HENT:     Head: Normocephalic and atraumatic.  Eyes:     General: No scleral icterus.    Pupils: Pupils are equal, round, and reactive to light.  Neck:     Musculoskeletal: Normal range of motion and neck supple.  Cardiovascular:     Rate and Rhythm: Normal rate and regular rhythm.     Heart sounds: Normal heart sounds.  Pulmonary:     Effort: Pulmonary effort is normal. No respiratory distress.     Breath sounds: No wheezing.  Abdominal:     General: Bowel sounds are normal. There is no distension.     Palpations: Abdomen is soft. There is no mass.     Tenderness: There is no abdominal tenderness.  Musculoskeletal: Normal range of motion.        General: No deformity.  Skin:    General: Skin is warm and dry.     Findings: No erythema or rash.  Neurological:     Mental Status: He is alert and oriented to person, place, and time.     Cranial Nerves: No cranial nerve deficit.     Coordination: Coordination normal.  Psychiatric:        Behavior: Behavior normal.        Thought Content: Thought content normal.      LABORATORY DATA:  I have reviewed the data as listed Lab Results  Component Value Date   WBC 5.2 04/06/2018   HGB 13.3 04/06/2018   HCT 38.4 (L) 04/06/2018   MCV 95.3 04/06/2018   PLT 146 (L) 04/06/2018   Recent Labs    03/09/18 0810 03/23/18 0811 04/06/18 0839  NA 138 138 139  K 4.1 4.1 4.1  CL 108 108 107  CO2 25 24 24   GLUCOSE 114* 109* 106*  BUN 12 11 11   CREATININE 0.86 0.87 0.83  CALCIUM 9.2 9.1 9.3  GFRNONAA >60 >60 >60  GFRAA >60 >60 >60  PROT 7.3 7.3 7.1  ALBUMIN 4.1 4.1 3.9  AST 22 22 26   ALT 26 24 30   ALKPHOS 77 74 74  BILITOT 0.4 0.6 0.6   Iron/TIBC/Ferritin/ %Sat No results found for: IRON, TIBC, FERRITIN, IRONPCTSAT   01/29/2018 baseline CEA 11.6   ASSESSMENT & PLAN:  1. Rectal cancer ([place])   2. 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 Tinea cruris     Cancer Staging Rectal cancer [facility]) Staging form: Colon and Rectum, AJCC 8th Edition - Clinical stage from 01/29/2018: Stage IIA (cT3,  cN0, cM0) - Signed by Earlie Server, MD on 02/01/2018  #Low rectal cancer, plan total neoadjuvant therapy.chemotherapy regimen with FOLFOX Q2w x 4 months, followed by concurrent Xeloda with RT,  Tolerates systemic FOLFOX well. Labs reviewed and discussed with patient.  Counts are acceptable to proceed with cycle 5 FOLFOX today. As cancer center is closed on New Year's Day, he will have 5-FU continuous infusion for 72 hours and get pump discontinued on day 4. #Neuropathy, chemotherapy-induced, grade 1.  Avoid cold exposure.   Monitor.    # Tinea Cruris, continue wearing loose underwear.  Continues topical clotrimazole as needed.    We spent sufficient time to discuss many aspect of care, questions were answered to patient's satisfaction. The patient knows to call the clinic with any problems questions or concerns.  Return of visit:2 weeks.  Total face to face encounter time for this patient visit was 25 min. >50% of the time was  spent in counseling and coordination of care.   Earlie Server, MD, PhD Hematology Oncology [facility] at [facility] Pager- [phone] 04/06/2018

## 2018-04-09 ENCOUNTER — Inpatient Hospital Stay: Payer: Medicaid Other | Attending: Oncology

## 2018-04-09 DIAGNOSIS — C20 Malignant neoplasm of rectum: Secondary | ICD-10-CM | POA: Insufficient documentation

## 2018-04-09 DIAGNOSIS — Z5111 Encounter for antineoplastic chemotherapy: Secondary | ICD-10-CM | POA: Diagnosis not present

## 2018-04-09 MED ORDER — SODIUM CHLORIDE 0.9% FLUSH
10.0000 mL | INTRAVENOUS | Status: DC | PRN
  Administered 2018-04-09: 10 mL
  Filled 2018-04-09: qty 10

## 2018-04-09 MED ORDER — HEPARIN SOD (PORK) LOCK FLUSH 100 UNIT/ML IV SOLN
500.0000 [IU] | Freq: Once | INTRAVENOUS | Status: AC | PRN
  Administered 2018-04-09: 500 [IU]

## 2018-04-09 MED ORDER — HEPARIN SOD (PORK) LOCK FLUSH 100 UNIT/ML IV SOLN
INTRAVENOUS | Status: AC
  Filled 2018-04-09: qty 5

## 2018-04-20 ENCOUNTER — Inpatient Hospital Stay (HOSPITAL_BASED_OUTPATIENT_CLINIC_OR_DEPARTMENT_OTHER): Payer: Medicaid Other | Admitting: Oncology

## 2018-04-20 ENCOUNTER — Other Ambulatory Visit: Payer: Self-pay

## 2018-04-20 ENCOUNTER — Inpatient Hospital Stay: Payer: Medicaid Other

## 2018-04-20 ENCOUNTER — Encounter: Payer: Self-pay | Admitting: Oncology

## 2018-04-20 VITALS — BP 133/82 | HR 70 | Temp 96.8°F | Resp 18 | Wt 181.7 lb

## 2018-04-20 DIAGNOSIS — T451X5A Adverse effect of antineoplastic and immunosuppressive drugs, initial encounter: Secondary | ICD-10-CM

## 2018-04-20 DIAGNOSIS — C20 Malignant neoplasm of rectum: Secondary | ICD-10-CM

## 2018-04-20 DIAGNOSIS — K1231 Oral mucositis (ulcerative) due to antineoplastic therapy: Secondary | ICD-10-CM

## 2018-04-20 DIAGNOSIS — K1230 Oral mucositis (ulcerative), unspecified: Secondary | ICD-10-CM

## 2018-04-20 DIAGNOSIS — G620 Drug-induced polyneuropathy: Secondary | ICD-10-CM

## 2018-04-20 DIAGNOSIS — R7401 Elevation of levels of liver transaminase levels: Secondary | ICD-10-CM

## 2018-04-20 DIAGNOSIS — R918 Other nonspecific abnormal finding of lung field: Secondary | ICD-10-CM

## 2018-04-20 DIAGNOSIS — D696 Thrombocytopenia, unspecified: Secondary | ICD-10-CM

## 2018-04-20 DIAGNOSIS — Z5111 Encounter for antineoplastic chemotherapy: Secondary | ICD-10-CM | POA: Diagnosis not present

## 2018-04-20 DIAGNOSIS — R740 Nonspecific elevation of levels of transaminase and lactic acid dehydrogenase [LDH]: Secondary | ICD-10-CM

## 2018-04-20 DIAGNOSIS — F17210 Nicotine dependence, cigarettes, uncomplicated: Secondary | ICD-10-CM

## 2018-04-20 LAB — CBC WITH DIFFERENTIAL/PLATELET
ABS IMMATURE GRANULOCYTES: 0.01 10*3/uL (ref 0.00–0.07)
Basophils Absolute: 0 10*3/uL (ref 0.0–0.1)
Basophils Relative: 1 %
Eosinophils Absolute: 0 10*3/uL (ref 0.0–0.5)
Eosinophils Relative: 1 %
HCT: 36.6 % — ABNORMAL LOW (ref 39.0–52.0)
Hemoglobin: 12.9 g/dL — ABNORMAL LOW (ref 13.0–17.0)
Immature Granulocytes: 0 %
Lymphocytes Relative: 35 %
Lymphs Abs: 1.9 10*3/uL (ref 0.7–4.0)
MCH: 34 pg (ref 26.0–34.0)
MCHC: 35.2 g/dL (ref 30.0–36.0)
MCV: 96.6 fL (ref 80.0–100.0)
MONO ABS: 1 10*3/uL (ref 0.1–1.0)
Monocytes Relative: 19 %
NEUTROS ABS: 2.4 10*3/uL (ref 1.7–7.7)
Neutrophils Relative %: 44 %
PLATELETS: 123 10*3/uL — AB (ref 150–400)
RBC: 3.79 MIL/uL — ABNORMAL LOW (ref 4.22–5.81)
RDW: 17.1 % — ABNORMAL HIGH (ref 11.5–15.5)
WBC: 5.4 10*3/uL (ref 4.0–10.5)
nRBC: 0 % (ref 0.0–0.2)

## 2018-04-20 LAB — COMPREHENSIVE METABOLIC PANEL
ALBUMIN: 4 g/dL (ref 3.5–5.0)
ALT: 70 U/L — ABNORMAL HIGH (ref 0–44)
AST: 43 U/L — ABNORMAL HIGH (ref 15–41)
Alkaline Phosphatase: 87 U/L (ref 38–126)
Anion gap: 6 (ref 5–15)
BUN: 10 mg/dL (ref 6–20)
CO2: 25 mmol/L (ref 22–32)
Calcium: 9.2 mg/dL (ref 8.9–10.3)
Chloride: 108 mmol/L (ref 98–111)
Creatinine, Ser: 0.83 mg/dL (ref 0.61–1.24)
GFR calc Af Amer: >60 mL/min (ref >60)
GFR calc non Af Amer: >60 mL/min (ref >60)
GLUCOSE: 107 mg/dL — AB (ref 70–99)
POTASSIUM: 4.1 mmol/L (ref 3.5–5.1)
Sodium: 139 mmol/L (ref 135–145)
Total Bilirubin: 0.6 mg/dL (ref 0.3–1.2)
Total Protein: 7.4 g/dL (ref 6.5–8.1)

## 2018-04-20 MED ORDER — DEXTROSE 5 % IV SOLN
Freq: Once | INTRAVENOUS | Status: AC
  Administered 2018-04-20: 09:00:00 via INTRAVENOUS
  Filled 2018-04-20: qty 250

## 2018-04-20 MED ORDER — SODIUM CHLORIDE 0.9 % IV SOLN
2400.0000 mg/m2 | INTRAVENOUS | Status: DC
  Administered 2018-04-20: 4900 mg via INTRAVENOUS
  Filled 2018-04-20: qty 98

## 2018-04-20 MED ORDER — OXALIPLATIN CHEMO INJECTION 100 MG/20ML
85.0000 mg/m2 | Freq: Once | INTRAVENOUS | Status: AC
  Administered 2018-04-20: 175 mg via INTRAVENOUS
  Filled 2018-04-20: qty 35

## 2018-04-20 MED ORDER — FLUOROURACIL CHEMO INJECTION 2.5 GM/50ML
400.0000 mg/m2 | Freq: Once | INTRAVENOUS | Status: AC
  Administered 2018-04-20: 800 mg via INTRAVENOUS
  Filled 2018-04-20: qty 16

## 2018-04-20 MED ORDER — DEXAMETHASONE SODIUM PHOSPHATE 10 MG/ML IJ SOLN
10.0000 mg | Freq: Once | INTRAMUSCULAR | Status: AC
  Administered 2018-04-20: 10 mg via INTRAVENOUS
  Filled 2018-04-20: qty 1

## 2018-04-20 MED ORDER — SODIUM CHLORIDE 0.9 % IV SOLN: 10.0000 mg | Freq: Once | INTRAVENOUS | Status: DC

## 2018-04-20 MED ORDER — PALONOSETRON HCL INJECTION 0.25 MG/5ML
0.2500 mg | Freq: Once | INTRAVENOUS | Status: AC
  Administered 2018-04-20: 0.25 mg via INTRAVENOUS
  Filled 2018-04-20: qty 5

## 2018-04-20 MED ORDER — LEUCOVORIN CALCIUM INJECTION 350 MG
800.0000 mg | Freq: Once | INTRAVENOUS | Status: AC
  Administered 2018-04-20: 800 mg via INTRAVENOUS
  Filled 2018-04-20: qty 10

## 2018-04-20 NOTE — Progress Notes (Signed)
Patient here for follow up.Pt states that after last treatment he had mouth sores. Problem has resolved.

## 2018-04-20 NOTE — Progress Notes (Signed)
Hematology/Oncology follow-up note [facility] Telephone:([phone] [phone]   Patient Care Team: Katheren Shams as PCP - General [name], RN as Registered Nurse  REFERRING PROVIDER: Dr.Thomas CHIEF COMPLAINTS/REASON FOR VISIT:  Follow-up for treatment of rectal cancer, assessment of tolerability of chemotherapy.  HISTORY OF PRESENTING ILLNESS:  Jeffery Mccormick is a  53 y.o.  male with PMH listed below who was referred to me for evaluation of rectal cancer.   He was evaluated by surgeon for evaluation of chronic rectal bleeding, ongoing chronic hemorrhoids.  12/25/2017 colonoscopy : revealed anal mass, 5-6 cm from the anal verge.  - Diverticulosis in the sigmoid colon. Biopsied. - A few 1 mm, non-bleeding polyps in the sigmoid colon, removed with a cold biopsy forceps. Resected and retrieved. Biopsied.  Biopsy showed  A. COLON POLYP, SIGMOID; COLD BIOPSY: - HYPERPLASTIC POLYP.  - NEGATIVE FOR DYSPLASIA AND MALIGNANCY.   B. RECTUM POLYP; COLD BIOPSY: - HYPERPLASTIC POLYP.  - NEGATIVE FOR DYSPLASIA AND MALIGNANCY.   C. RECTAL MASS; HOT SNARE:  - INVASIVE ADENOCARCINOMA, MODERATELY DIFFERENTIATED, ULCERATED.   # CT chest abdomen pelvis 01/16/2018 showed Wall thickening involving the lower rectum, corresponding to the patient's known rectal cancer. No findings specific for metastatic disease.  Scattered small bilateral pulmonary nodules measuring up to 3 mm, nonspecific but not considered overly suspicious for metastatic disease.   INTERVAL HISTORY Jeffery Mccormick is a 53 y.o. male who has above history reviewed by me today presents for assessment prior to cycle for neoadjuvant chemotherapy for rectal cancer cT3 N0 M0.  #Currently on neoadjuvant chemotherapy with FOLFOX.  Reports tolerating chemotherapy well. Rectal pain has improved.  No more rectal discharges or discomfort.  No additional episodes of rectal bleeding. He  has normal bowel movements daily. Appetite remains good.  #Reports having mouth blisters for a few days, felt mouth was swollen.  He ate soft food and blisters improved.  #Mild transient neuropathy on her fingertips.  Not changed. No new complaints today.  Minimal transient neuropathy on fingertips.  No changed.  Review of Systems  Constitutional: Negative for chills, fever, malaise/fatigue and weight loss.  HENT: Negative for nosebleeds and sore throat.   Eyes: Negative for double vision, photophobia and redness.  Respiratory: Negative for cough, shortness of breath and wheezing.   Cardiovascular: Negative for chest pain, palpitations, orthopnea and leg swelling.  Gastrointestinal: Negative for abdominal pain, blood in stool, nausea and vomiting.  Genitourinary: Negative for dysuria.  Musculoskeletal: Negative for back pain, myalgias and neck pain.  Skin: Negative for itching and rash.  Neurological: Negative for dizziness, tingling and tremors.  Endo/Heme/Allergies: Negative for environmental allergies. Does not bruise/bleed easily.  Psychiatric/Behavioral: Negative for depression and hallucinations.    MEDICAL HISTORY:  Past Medical History:  Diagnosis Date  . Hemorrhoid prolapse     SURGICAL HISTORY: Past Surgical History:  Procedure Laterality Date  . COLONOSCOPY WITH PROPOFOL N/A 12/25/2017   Procedure: COLONOSCOPY WITH PROPOFOL;  Surgeon: Benjamine Sprague, DO;  Location: [facility];  Service: General;  Laterality: N/A;  . HERNIA REPAIR  07/2013   Right Inguinal-Dr. Burt Knack  . PORTACATH PLACEMENT Right 02/03/2018   Procedure: INSERTION PORT-A-CATH;  Surgeon: Benjamine Sprague, DO;  Location: [facility];  Service: General;  Laterality: Right;    SOCIAL HISTORY: Social History   Socioeconomic History  . Marital status: Married    Spouse name: Not on file  . Number of children: Not on file  . Years of education: Not  on file  . Highest education level: Not on file    Occupational History  . Occupation: unemployed  Social Needs  . Financial resource strain: Not on file  . Food insecurity:    Worry: Not on file    Inability: Not on file  . Transportation needs:    Medical: Not on file    Non-medical: Not on file  Tobacco Use  . Smoking status: Current Every Day Smoker    Packs/day: 1.00    Years: 30.00    Pack years: 30.00    Types: Cigarettes  . Smokeless tobacco: Never Used  Substance and Sexual Activity  . Alcohol use: Yes    Frequency: Never    Comment: occasionally  . Drug use: No  . Sexual activity: Not on file  Lifestyle  . Physical activity:    Days per week: Not on file    Minutes per session: Not on file  . Stress: Not on file  Relationships  . Social connections:    Talks on phone: Not on file    Gets together: Not on file    Attends religious service: Not on file    Active member of club or organization: Not on file    Attends meetings of clubs or organizations: Not on file    Relationship status: Not on file  . Intimate partner violence:    Fear of current or ex partner: Not on file    Emotionally abused: Not on file    Physically abused: Not on file    Forced sexual activity: Not on file  Other Topics Concern  . Not on file  Social History Narrative  . Not on file    FAMILY HISTORY: Family History  Family history unknown: Yes  patient reports that he does not know any family history.   ALLERGIES:  has No Known Allergies.  MEDICATIONS:  Current Outpatient Medications  Medication Sig Dispense Refill  . clotrimazole-betamethasone (LOTRISONE) cream Apply 1 application topically 2 (two) times daily. 30 g 0  . HYDROcodone-acetaminophen (NORCO) 5-325 MG tablet Take 1 tablet by mouth every 6 (six) hours as needed for up to 5 doses for moderate pain. 5 tablet 0  . ibuprofen (ADVIL,MOTRIN) 800 MG tablet Take 1 tablet (800 mg total) by mouth every 8 (eight) hours as needed for mild pain or moderate pain. 30 tablet 0   . lidocaine-prilocaine (EMLA) cream Apply to affected area once 30 g 3  . loperamide (IMODIUM) 2 MG capsule Take 1 capsule (2 mg total) by mouth See admin instructions. With onset of loose stool, take 4mg  followed by 2mg  every 2 hours until 12 hours have passed without loose bowel movement. Maximum: 16 mg/day 120 capsule 1  . nystatin (MYCOSTATIN/NYSTOP) powder Apply topically 4 (four) times daily. 15 g 0  . ondansetron (ZOFRAN) 8 MG tablet Take 1 tablet (8 mg total) by mouth 2 (two) times daily as needed for refractory nausea / vomiting. Start on day 3 after chemotherapy. 30 tablet 1  . polyethylene glycol (MIRALAX / GLYCOLAX) packet Take 17 g by mouth daily as needed for moderate constipation.     . prochlorperazine (COMPAZINE) 10 MG tablet Take 1 tablet (10 mg total) by mouth every 6 (six) hours as needed (Nausea or vomiting). 30 tablet 1   No current facility-administered medications for this visit.    RADIOGRAPHIC STUDIES: I have personally reviewed the radiological images as listed and agreed with the findings in the report. 01/23/2018 MRI pelvis with  and without contrast Low rectal/anorectal adenocarcinoma T stage:  Early T3 Rectal adenocarcinoma N stage:  N0 Distance from tumor to the anal sphincter is minimal. The tumor extends to or just above the insertion of the external sphincter.  PHYSICAL EXAMINATION: ECOG PERFORMANCE STATUS: 1 - Symptomatic but completely ambulatory Vitals:   04/20/18 0846  BP: 133/82  Pulse: 70  Resp: 18  Temp: (!) 96.8 F (36 C)   Filed Weights   04/20/18 0846  Weight: 181 lb 11.2 oz (82.4 kg)    Physical Exam Constitutional:      General: He is not in acute distress. HENT:     Head: Normocephalic and atraumatic.  Eyes:     General: No scleral icterus.    Pupils: Pupils are equal, round, and reactive to light.  Neck:     Musculoskeletal: Normal range of motion and neck supple.  Cardiovascular:     Rate and Rhythm: Normal rate and regular  rhythm.     Heart sounds: Normal heart sounds.  Pulmonary:     Effort: Pulmonary effort is normal. No respiratory distress.     Breath sounds: No wheezing.  Abdominal:     General: Bowel sounds are normal. There is no distension.     Palpations: Abdomen is soft. There is no mass.     Tenderness: There is no abdominal tenderness.  Musculoskeletal: Normal range of motion.        General: No deformity.  Skin:    General: Skin is warm and dry.     Findings: No erythema or rash.  Neurological:     Mental Status: He is alert and oriented to person, place, and time.     Cranial Nerves: No cranial nerve deficit.     Coordination: Coordination normal.  Psychiatric:        Behavior: Behavior normal.        Thought Content: Thought content normal.      LABORATORY DATA:  I have reviewed the data as listed Lab Results  Component Value Date   WBC 5.2 04/06/2018   HGB 13.3 04/06/2018   HCT 38.4 (L) 04/06/2018   MCV 95.3 04/06/2018   PLT 146 (L) 04/06/2018   Recent Labs    03/09/18 0810 03/23/18 0811 04/06/18 0839  NA 138 138 139  K 4.1 4.1 4.1  CL 108 108 107  CO2 25 24 24   GLUCOSE 114* 109* 106*  BUN 12 11 11   CREATININE 0.86 0.87 0.83  CALCIUM 9.2 9.1 9.3  GFRNONAA >60 >60 >60  GFRAA >60 >60 >60  PROT 7.3 7.3 7.1  ALBUMIN 4.1 4.1 3.9  AST 22 22 26   ALT 26 24 30   ALKPHOS 77 74 74  BILITOT 0.4 0.6 0.6   Iron/TIBC/Ferritin/ %Sat No results found for: IRON, TIBC, FERRITIN, IRONPCTSAT   01/29/2018 baseline CEA 11.6   ASSESSMENT & PLAN:  1. Rectal cancer ([place])   2. Encounter for antineoplastic chemotherapy   3. Neuropathy due to chemotherapeutic drug ([place])   4. Mucositis   5. Transaminitis   6. Thrombocytopenia ([place])   Cancer Staging Rectal cancer [facility]) Staging form: Colon and Rectum, AJCC 8th Edition - Clinical stage from 01/29/2018: Stage IIA (cT3, cN0, cM0) - Signed by Earlie Server, MD on 02/01/2018  #Low rectal cancer, plan total neoadjuvant  therapy.chemotherapy regimen with FOLFOX Q2w x 4 months, followed by concurrent Xeloda with RT,  Tolerates neoadjuvant chemotherapy with FOLFOX very well. Labs reviewed and discussed with patient.  Counts acceptable to  proceed with cycle 6 FOLFOX today.  Plan total 8 cycles of FOLFOX prior to starting concurrent chemoradiation. Patient to see Dr. Baruch Gouty for evaluation of planning on starting concurrent chemoradiation after finishing 8 cycles of FOLFOX.  Cycle 8 FOLFOX scheduled on 05/18/2018.  #Transaminitis, secondary to chemotherapy.  Continue to monitor. #Mucositis, chemotherapy related.  This is a new problem for him.  Discussed with patient of starting Magic mouthwash prophylactically swish and spit.   #Neuropathy, chemotherapy-induced, grade 1.  Advised patient to avoid cold exposure.  Continue to monitor.  Monitor.   #Anemia and thrombocytopenia, secondary to chemotherapy.  Will close monitor.  Labs every 2 weeks. We spent sufficient time to discuss many aspect of care, questions were answered to patient's satisfaction. The patient knows to call the clinic with any problems questions or concerns.  Return of visit:2 weeks.    Earlie Server, MD, PhD Hematology Oncology [facility] at [facility] Pager- [phone] 04/20/2018

## 2018-04-21 LAB — CEA: CEA: 4.3 ng/mL (ref 0.0–4.7)

## 2018-04-22 ENCOUNTER — Inpatient Hospital Stay: Payer: Medicaid Other

## 2018-04-22 VITALS — BP 126/86 | HR 76 | Temp 98.2°F | Resp 18

## 2018-04-22 DIAGNOSIS — Z5111 Encounter for antineoplastic chemotherapy: Secondary | ICD-10-CM | POA: Diagnosis not present

## 2018-04-22 DIAGNOSIS — C20 Malignant neoplasm of rectum: Secondary | ICD-10-CM

## 2018-04-22 MED ORDER — HEPARIN SOD (PORK) LOCK FLUSH 100 UNIT/ML IV SOLN
500.0000 [IU] | Freq: Once | INTRAVENOUS | Status: AC | PRN
  Administered 2018-04-22: 500 [IU]
  Filled 2018-04-22: qty 5

## 2018-05-04 ENCOUNTER — Inpatient Hospital Stay (HOSPITAL_BASED_OUTPATIENT_CLINIC_OR_DEPARTMENT_OTHER): Payer: Medicaid Other | Admitting: Oncology

## 2018-05-04 ENCOUNTER — Inpatient Hospital Stay: Payer: Medicaid Other

## 2018-05-04 ENCOUNTER — Encounter: Payer: Self-pay | Admitting: Oncology

## 2018-05-04 ENCOUNTER — Other Ambulatory Visit: Payer: Self-pay

## 2018-05-04 VITALS — BP 137/78 | HR 66 | Temp 95.9°F | Wt 182.5 lb

## 2018-05-04 DIAGNOSIS — C20 Malignant neoplasm of rectum: Secondary | ICD-10-CM

## 2018-05-04 DIAGNOSIS — Z5111 Encounter for antineoplastic chemotherapy: Secondary | ICD-10-CM

## 2018-05-04 DIAGNOSIS — F17210 Nicotine dependence, cigarettes, uncomplicated: Secondary | ICD-10-CM

## 2018-05-04 DIAGNOSIS — D696 Thrombocytopenia, unspecified: Secondary | ICD-10-CM

## 2018-05-04 DIAGNOSIS — D701 Agranulocytosis secondary to cancer chemotherapy: Secondary | ICD-10-CM

## 2018-05-04 DIAGNOSIS — K1230 Oral mucositis (ulcerative), unspecified: Secondary | ICD-10-CM

## 2018-05-04 DIAGNOSIS — T451X5A Adverse effect of antineoplastic and immunosuppressive drugs, initial encounter: Secondary | ICD-10-CM

## 2018-05-04 DIAGNOSIS — G620 Drug-induced polyneuropathy: Secondary | ICD-10-CM

## 2018-05-04 LAB — COMPREHENSIVE METABOLIC PANEL
ALT: 35 U/L (ref 0–44)
AST: 30 U/L (ref 15–41)
Albumin: 3.8 g/dL (ref 3.5–5.0)
Alkaline Phosphatase: 90 U/L (ref 38–126)
Anion gap: 7 (ref 5–15)
BUN: 11 mg/dL (ref 6–20)
CO2: 23 mmol/L (ref 22–32)
Calcium: 9 mg/dL (ref 8.9–10.3)
Chloride: 109 mmol/L (ref 98–111)
Creatinine, Ser: 0.89 mg/dL (ref 0.61–1.24)
GFR calc Af Amer: >60 mL/min (ref >60)
GFR calc non Af Amer: >60 mL/min (ref >60)
Glucose, Bld: 107 mg/dL — ABNORMAL HIGH (ref 70–99)
POTASSIUM: 4.3 mmol/L (ref 3.5–5.1)
Sodium: 139 mmol/L (ref 135–145)
Total Bilirubin: 0.4 mg/dL (ref 0.3–1.2)
Total Protein: 7.2 g/dL (ref 6.5–8.1)

## 2018-05-04 LAB — CBC WITH DIFFERENTIAL/PLATELET
ABS IMMATURE GRANULOCYTES: 0.01 10*3/uL (ref 0.00–0.07)
BASOS PCT: 1 %
Basophils Absolute: 0 10*3/uL (ref 0.0–0.1)
Eosinophils Absolute: 0.1 10*3/uL (ref 0.0–0.5)
Eosinophils Relative: 1 %
HCT: 36.7 % — ABNORMAL LOW (ref 39.0–52.0)
Hemoglobin: 12.8 g/dL — ABNORMAL LOW (ref 13.0–17.0)
Immature Granulocytes: 0 %
Lymphocytes Relative: 32 %
Lymphs Abs: 1.7 10*3/uL (ref 0.7–4.0)
MCH: 34.7 pg — AB (ref 26.0–34.0)
MCHC: 34.9 g/dL (ref 30.0–36.0)
MCV: 99.5 fL (ref 80.0–100.0)
Monocytes Absolute: 0.9 10*3/uL (ref 0.1–1.0)
Monocytes Relative: 18 %
Neutro Abs: 2.5 10*3/uL (ref 1.7–7.7)
Neutrophils Relative %: 48 %
PLATELETS: 87 10*3/uL — AB (ref 150–400)
RBC: 3.69 MIL/uL — ABNORMAL LOW (ref 4.22–5.81)
RDW: 18.6 % — ABNORMAL HIGH (ref 11.5–15.5)
WBC: 5.2 10*3/uL (ref 4.0–10.5)
nRBC: 0 % (ref 0.0–0.2)

## 2018-05-04 MED ORDER — SODIUM CHLORIDE 0.9% FLUSH
10.0000 mL | Freq: Once | INTRAVENOUS | Status: AC
  Administered 2018-05-04: 10 mL via INTRAVENOUS
  Filled 2018-05-04: qty 10

## 2018-05-04 MED ORDER — DEXAMETHASONE SODIUM PHOSPHATE 10 MG/ML IJ SOLN
10.0000 mg | Freq: Once | INTRAMUSCULAR | Status: AC
  Administered 2018-05-04: 10 mg via INTRAVENOUS
  Filled 2018-05-04: qty 1

## 2018-05-04 MED ORDER — HEPARIN SOD (PORK) LOCK FLUSH 100 UNIT/ML IV SOLN: 500.0000 [IU] | Freq: Once | INTRAVENOUS | Status: DC

## 2018-05-04 MED ORDER — NYSTATIN 100000 UNIT/ML MT SUSP: 5.0000 mL | Freq: Three times a day (TID) | OROMUCOSAL | 0 refills | Status: DC

## 2018-05-04 MED ORDER — LEUCOVORIN CALCIUM INJECTION 350 MG
800.0000 mg | Freq: Once | INTRAVENOUS | Status: AC
  Administered 2018-05-04: 800 mg via INTRAVENOUS
  Filled 2018-05-04: qty 40

## 2018-05-04 MED ORDER — PALONOSETRON HCL INJECTION 0.25 MG/5ML
0.2500 mg | Freq: Once | INTRAVENOUS | Status: AC
  Administered 2018-05-04: 0.25 mg via INTRAVENOUS
  Filled 2018-05-04: qty 5

## 2018-05-04 MED ORDER — OXALIPLATIN CHEMO INJECTION 100 MG/20ML
85.0000 mg/m2 | Freq: Once | INTRAVENOUS | Status: AC
  Administered 2018-05-04: 175 mg via INTRAVENOUS
  Filled 2018-05-04: qty 35

## 2018-05-04 MED ORDER — DEXTROSE 5 % IV SOLN
Freq: Once | INTRAVENOUS | Status: AC
  Administered 2018-05-04: 09:00:00 via INTRAVENOUS
  Filled 2018-05-04: qty 250

## 2018-05-04 MED ORDER — SODIUM CHLORIDE 0.9 % IV SOLN
2400.0000 mg/m2 | INTRAVENOUS | Status: DC
  Administered 2018-05-04: 4900 mg via INTRAVENOUS
  Filled 2018-05-04: qty 98

## 2018-05-04 NOTE — Progress Notes (Signed)
Hematology/Oncology follow-up note [facility] Telephone:([phone] [phone]) [phone]   Patient Care Team: Katheren Shams as PCP - General [name], RN as Registered Nurse  REFERRING PROVIDER: Dr.Thomas CHIEF COMPLAINTS/REASON FOR VISIT:  Follow-up for treatment of rectal cancer, assessment of tolerability of chemotherapy.  HISTORY OF PRESENTING ILLNESS:  Jeffery Mccormick is a  53 y.o.  male with PMH listed below who was referred to me for evaluation of rectal cancer.   He was evaluated by surgeon for evaluation of chronic rectal bleeding, ongoing chronic hemorrhoids.  12/25/2017 colonoscopy : revealed anal mass, 5-6 cm from the anal verge.  - Diverticulosis in the sigmoid colon. Biopsied. - A few 1 mm, non-bleeding polyps in the sigmoid colon, removed with a cold biopsy forceps. Resected and retrieved. Biopsied.  Biopsy showed  A. COLON POLYP, SIGMOID; COLD BIOPSY: - HYPERPLASTIC POLYP.  - NEGATIVE FOR DYSPLASIA AND MALIGNANCY.   B. RECTUM POLYP; COLD BIOPSY: - HYPERPLASTIC POLYP.  - NEGATIVE FOR DYSPLASIA AND MALIGNANCY.   C. RECTAL MASS; HOT SNARE:  - INVASIVE ADENOCARCINOMA, MODERATELY DIFFERENTIATED, ULCERATED.   # CT chest abdomen pelvis 01/16/2018 showed Wall thickening involving the lower rectum, corresponding to the patient's known rectal cancer. No findings specific for metastatic disease.  Scattered small bilateral pulmonary nodules measuring up to 3 mm, nonspecific but not considered overly suspicious for metastatic disease.   INTERVAL HISTORY Jeffery Mccormick is a 53 y.o. male who has above history reviewed by me today presents for assessment prior to cycle for neoadjuvant chemotherapy for rectal cancer cT3 N0 M0.  Currently on neoadjuvant chemotherapy with FOLFOX.  Reports tolerating chemotherapy well.  Denies any persistent numbness or tingling of his fingertips or toes. Reports very small amount of blood when he  wipes his nose. Denies hematochezia, hematuria, hematemesis, epistaxis, black tarry stool or easy bruising.   Appetite is good.  Does not have much taste Denies any diarrhea, abdominal pain, shortness of breath.     Review of Systems  Constitutional: Negative for chills, fever, malaise/fatigue and weight loss.  HENT: Negative for nosebleeds and sore throat.   Eyes: Negative for double vision, photophobia and redness.  Respiratory: Negative for cough, shortness of breath and wheezing.   Cardiovascular: Negative for chest pain, palpitations, orthopnea and leg swelling.  Gastrointestinal: Negative for abdominal pain, blood in stool, nausea and vomiting.  Genitourinary: Negative for dysuria.  Musculoskeletal: Negative for back pain, myalgias and neck pain.  Skin: Negative for itching and rash.  Neurological: Negative for dizziness, tingling and tremors.  Endo/Heme/Allergies: Negative for environmental allergies. Does not bruise/bleed easily.  Psychiatric/Behavioral: Negative for depression and hallucinations.    MEDICAL HISTORY:  Past Medical History:  Diagnosis Date  . Hemorrhoid prolapse     SURGICAL HISTORY: Past Surgical History:  Procedure Laterality Date  . COLONOSCOPY WITH PROPOFOL N/A 12/25/2017   Procedure: COLONOSCOPY WITH PROPOFOL;  Surgeon: Benjamine Sprague, DO;  Location: [place] [facility];  Service: General;  Laterality: N/A;  . HERNIA REPAIR  07/2013   Right Inguinal-Dr. Burt Knack  . PORTACATH PLACEMENT Right 02/03/2018   Procedure: INSERTION PORT-A-CATH;  Surgeon: Benjamine Sprague, DO;  Location: [facility];  Service: General;  Laterality: Right;    SOCIAL HISTORY: Social History   Socioeconomic History  . Marital status: Married    Spouse name: Not on file  . Number of children: Not on file  . Years of education: Not on file  . Highest education level: Not on file  Occupational History  .  Occupation: unemployed  Social Needs  . Financial resource strain: Not on file    . Food insecurity:    Worry: Not on file    Inability: Not on file  . Transportation needs:    Medical: Not on file    Non-medical: Not on file  Tobacco Use  . Smoking status: Current Every Day Smoker    Packs/day: 1.00    Years: 30.00    Pack years: 30.00    Types: Cigarettes  . Smokeless tobacco: Never Used  Substance and Sexual Activity  . Alcohol use: Yes    Frequency: Never    Comment: occasionally  . Drug use: No  . Sexual activity: Not on file  Lifestyle  . Physical activity:    Days per week: Not on file    Minutes per session: Not on file  . Stress: Not on file  Relationships  . Social connections:    Talks on phone: Not on file    Gets together: Not on file    Attends religious service: Not on file    Active member of club or organization: Not on file    Attends meetings of clubs or organizations: Not on file    Relationship status: Not on file  . Intimate partner violence:    Fear of current or ex partner: Not on file    Emotionally abused: Not on file    Physically abused: Not on file    Forced sexual activity: Not on file  Other Topics Concern  . Not on file  Social History Narrative  . Not on file    FAMILY HISTORY: Family History  Family history unknown: Yes  patient reports that he does not know any family history.   ALLERGIES:  has No Known Allergies.  MEDICATIONS:  Current Outpatient Medications  Medication Sig Dispense Refill  . clotrimazole-betamethasone (LOTRISONE) cream Apply 1 application topically 2 (two) times daily. 30 g 0  . HYDROcodone-acetaminophen (NORCO) 5-325 MG tablet Take 1 tablet by mouth every 6 (six) hours as needed for up to 5 doses for moderate pain. 5 tablet 0  . ibuprofen (ADVIL,MOTRIN) 800 MG tablet Take 1 tablet (800 mg total) by mouth every 8 (eight) hours as needed for mild pain or moderate pain. 30 tablet 0  . lidocaine-prilocaine (EMLA) cream Apply to affected area once 30 g 3  . loperamide (IMODIUM) 2 MG  capsule Take 1 capsule (2 mg total) by mouth See admin instructions. With onset of loose stool, take 4mg  followed by 2mg  every 2 hours until 12 hours have passed without loose bowel movement. Maximum: 16 mg/day 120 capsule 1  . nystatin (MYCOSTATIN/NYSTOP) powder Apply topically 4 (four) times daily. 15 g 0  . ondansetron (ZOFRAN) 8 MG tablet Take 1 tablet (8 mg total) by mouth 2 (two) times daily as needed for refractory nausea / vomiting. Start on day 3 after chemotherapy. 30 tablet 1  . polyethylene glycol (MIRALAX / GLYCOLAX) packet Take 17 g by mouth daily as needed for moderate constipation.     . prochlorperazine (COMPAZINE) 10 MG tablet Take 1 tablet (10 mg total) by mouth every 6 (six) hours as needed (Nausea or vomiting). 30 tablet 1   No current facility-administered medications for this visit.    Facility-Administered Medications Ordered in Other Visits  Medication Dose Route Frequency Provider Last Rate Last Dose  . heparin lock flush 100 unit/mL  500 Units Intravenous Once Earlie Server, MD       RADIOGRAPHIC  STUDIES: I have personally reviewed the radiological images as listed and agreed with the findings in the report. 01/23/2018 MRI pelvis with and without contrast Low rectal/anorectal adenocarcinoma T stage:  Early T3 Rectal adenocarcinoma N stage:  N0 Distance from tumor to the anal sphincter is minimal. The tumor extends to or just above the insertion of the external sphincter.  PHYSICAL EXAMINATION: ECOG PERFORMANCE STATUS: 1 - Symptomatic but completely ambulatory Vitals:   05/04/18 0935  BP: 137/78  Pulse: 66  Temp: (!) 95.9 F (35.5 C)   Filed Weights   05/04/18 0935  Weight: 182 lb 8 oz (82.8 kg)    Physical Exam Constitutional:      General: He is not in acute distress. HENT:     Head: Normocephalic and atraumatic.  Eyes:     General: No scleral icterus.    Pupils: Pupils are equal, round, and reactive to light.  Neck:     Musculoskeletal: Normal range  of motion and neck supple.  Cardiovascular:     Rate and Rhythm: Normal rate and regular rhythm.     Heart sounds: Normal heart sounds.  Pulmonary:     Effort: Pulmonary effort is normal. No respiratory distress.     Breath sounds: No wheezing.  Abdominal:     General: Bowel sounds are normal. There is no distension.     Palpations: Abdomen is soft. There is no mass.     Tenderness: There is no abdominal tenderness.  Musculoskeletal: Normal range of motion.        General: No deformity.  Skin:    General: Skin is warm and dry.     Findings: No erythema or rash.  Neurological:     Mental Status: He is alert and oriented to person, place, and time.     Cranial Nerves: No cranial nerve deficit.     Coordination: Coordination normal.  Psychiatric:        Behavior: Behavior normal.        Thought Content: Thought content normal.      LABORATORY DATA:  I have reviewed the data as listed Lab Results  Component Value Date   WBC 5.2 05/04/2018   HGB 12.8 (L) 05/04/2018   HCT 36.7 (L) 05/04/2018   MCV 99.5 05/04/2018   PLT 87 (L) 05/04/2018   Recent Labs    03/23/18 0811 04/06/18 0839 04/20/18 0826  NA 138 139 139  K 4.1 4.1 4.1  CL 108 107 108  CO2 24 24 25   GLUCOSE 109* 106* 107*  BUN 11 11 10   CREATININE 0.87 0.83 0.83  CALCIUM 9.1 9.3 9.2  GFRNONAA >60 >60 >60  GFRAA >60 >60 >60  PROT 7.3 7.1 7.4  ALBUMIN 4.1 3.9 4.0  AST 22 26 43*  ALT 24 30 70*  ALKPHOS 74 74 87  BILITOT 0.6 0.6 0.6   Iron/TIBC/Ferritin/ %Sat No results found for: IRON, TIBC, FERRITIN, IRONPCTSAT   01/29/2018 baseline CEA 11.6   ASSESSMENT & PLAN:  1. Rectal cancer ([place])   2. 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 Thrombocytopenia ([place])   Cancer Staging Rectal cancer [facility]) Staging form: Colon and Rectum, AJCC 8th Edition - Clinical stage from 01/29/2018: Stage IIA (cT3, cN0, cM0) - Signed by Earlie Server, MD on  02/01/2018  #Low rectal cancer, plan total neoadjuvant therapy.chemotherapy regimen with FOLFOX Q2w x 4 months, followed by concurrent Xeloda with RT,  Tolerates chemotherapy with FOLFOX very  well. Labs reviewed and discussed with patient. Grade 1 thrombocytopenia with platelet count of 87,000. Proceed with cycle 7 FOLFOX today with omitting 5-FU bolus today due to thrombocytopenia.  Plan total 8 cycles of FOLFOX prior to starting concurrent chemoradiation. Patient to see Dr. Baruch Gouty for evaluation of planning on starting concurrent chemoradiation after finishing 8 cycles of FOLFOX.  Cycle 8 FOLFOX scheduled on 05/18/2018.  #Thrombocytopenia, grade 1, secondary to chemotherapy.  Continue monitor.   #Transaminitis, secondary to chemotherapy.  Resolved #Mucositis, thick tongue plaque, questionable thrush.  This is a new problem for him.  Continue Magic mouthwash.  I will also send him a prescription of nystatin swish and spit to be used prophylactically.  #Neuropathy, chemotherapy-induced, grade 1.  Advised patient to avoid cold exposure.  At baseline.  Not worse.  Continue monitoring.    We spent sufficient time to discuss many aspect of care, questions were answered to patient's satisfaction. Total face to face encounter time for this patient visit was 25 min. >50% of the time was  spent in counseling and coordination of care.   Return of visit: 2 weeks.    Earlie Server, MD, PhD Hematology Oncology [facility] at [facility] Pager- [phone] 05/04/2018

## 2018-05-05 LAB — CEA: CEA: 4.1 ng/mL (ref 0.0–4.7)

## 2018-05-06 ENCOUNTER — Inpatient Hospital Stay: Payer: Medicaid Other

## 2018-05-06 ENCOUNTER — Telehealth: Payer: Self-pay

## 2018-05-06 ENCOUNTER — Ambulatory Visit: Payer: Self-pay | Admitting: Radiation Oncology

## 2018-05-06 DIAGNOSIS — Z5111 Encounter for antineoplastic chemotherapy: Secondary | ICD-10-CM | POA: Diagnosis not present

## 2018-05-06 DIAGNOSIS — C20 Malignant neoplasm of rectum: Secondary | ICD-10-CM

## 2018-05-06 MED ORDER — HEPARIN SOD (PORK) LOCK FLUSH 100 UNIT/ML IV SOLN
INTRAVENOUS | Status: AC
  Filled 2018-05-06: qty 5

## 2018-05-06 MED ORDER — HEPARIN SOD (PORK) LOCK FLUSH 100 UNIT/ML IV SOLN
500.0000 [IU] | Freq: Once | INTRAVENOUS | Status: AC | PRN
  Administered 2018-05-06: 500 [IU]

## 2018-05-06 MED ORDER — SODIUM CHLORIDE 0.9% FLUSH
10.0000 mL | INTRAVENOUS | Status: DC | PRN
  Administered 2018-05-06: 10 mL
  Filled 2018-05-06: qty 10

## 2018-05-06 NOTE — Telephone Encounter (Signed)
Appt for 1/29 with Dr. Baruch Gouty had been cancelled via an [place]. He has been rescheduled to see Dr. Baruch Gouty 2/12 at 1400 following cycle 8 pump removal.

## 2018-05-18 ENCOUNTER — Inpatient Hospital Stay (HOSPITAL_BASED_OUTPATIENT_CLINIC_OR_DEPARTMENT_OTHER): Payer: Medicaid Other | Admitting: Oncology

## 2018-05-18 ENCOUNTER — Inpatient Hospital Stay: Payer: Medicaid Other | Attending: Oncology

## 2018-05-18 ENCOUNTER — Inpatient Hospital Stay: Payer: Medicaid Other

## 2018-05-18 ENCOUNTER — Other Ambulatory Visit: Payer: Self-pay

## 2018-05-18 ENCOUNTER — Telehealth: Payer: Self-pay | Admitting: Pharmacist

## 2018-05-18 ENCOUNTER — Encounter: Payer: Self-pay | Admitting: Oncology

## 2018-05-18 VITALS — BP 135/85 | Temp 97.2°F | Wt 178.7 lb

## 2018-05-18 VITALS — HR 62

## 2018-05-18 DIAGNOSIS — C20 Malignant neoplasm of rectum: Secondary | ICD-10-CM | POA: Diagnosis present

## 2018-05-18 DIAGNOSIS — Z791 Long term (current) use of non-steroidal anti-inflammatories (NSAID): Secondary | ICD-10-CM | POA: Insufficient documentation

## 2018-05-18 DIAGNOSIS — T451X5A Adverse effect of antineoplastic and immunosuppressive drugs, initial encounter: Secondary | ICD-10-CM

## 2018-05-18 DIAGNOSIS — G620 Drug-induced polyneuropathy: Secondary | ICD-10-CM | POA: Diagnosis not present

## 2018-05-18 DIAGNOSIS — R918 Other nonspecific abnormal finding of lung field: Secondary | ICD-10-CM | POA: Diagnosis not present

## 2018-05-18 DIAGNOSIS — K1230 Oral mucositis (ulcerative), unspecified: Secondary | ICD-10-CM | POA: Diagnosis not present

## 2018-05-18 DIAGNOSIS — F17210 Nicotine dependence, cigarettes, uncomplicated: Secondary | ICD-10-CM | POA: Insufficient documentation

## 2018-05-18 DIAGNOSIS — D696 Thrombocytopenia, unspecified: Secondary | ICD-10-CM | POA: Diagnosis not present

## 2018-05-18 DIAGNOSIS — Z5111 Encounter for antineoplastic chemotherapy: Secondary | ICD-10-CM

## 2018-05-18 DIAGNOSIS — Z79899 Other long term (current) drug therapy: Secondary | ICD-10-CM | POA: Insufficient documentation

## 2018-05-18 DIAGNOSIS — K1231 Oral mucositis (ulcerative) due to antineoplastic therapy: Secondary | ICD-10-CM

## 2018-05-18 LAB — CBC WITH DIFFERENTIAL/PLATELET
Abs Immature Granulocytes: 0.01 10*3/uL (ref 0.00–0.07)
Basophils Absolute: 0 10*3/uL (ref 0.0–0.1)
Basophils Relative: 1 %
EOS PCT: 1 %
Eosinophils Absolute: 0.1 10*3/uL (ref 0.0–0.5)
HCT: 37.8 % — ABNORMAL LOW (ref 39.0–52.0)
HEMOGLOBIN: 13 g/dL (ref 13.0–17.0)
Immature Granulocytes: 0 %
LYMPHS PCT: 35 %
Lymphs Abs: 2.1 10*3/uL (ref 0.7–4.0)
MCH: 34.9 pg — ABNORMAL HIGH (ref 26.0–34.0)
MCHC: 34.4 g/dL (ref 30.0–36.0)
MCV: 101.3 fL — ABNORMAL HIGH (ref 80.0–100.0)
Monocytes Absolute: 1.2 10*3/uL — ABNORMAL HIGH (ref 0.1–1.0)
Monocytes Relative: 20 %
Neutro Abs: 2.6 10*3/uL (ref 1.7–7.7)
Neutrophils Relative %: 43 %
Platelets: 104 10*3/uL — ABNORMAL LOW (ref 150–400)
RBC: 3.73 MIL/uL — ABNORMAL LOW (ref 4.22–5.81)
RDW: 18.9 % — ABNORMAL HIGH (ref 11.5–15.5)
WBC: 5.9 10*3/uL (ref 4.0–10.5)
nRBC: 0 % (ref 0.0–0.2)

## 2018-05-18 LAB — COMPREHENSIVE METABOLIC PANEL
ALT: 32 U/L (ref 0–44)
AST: 27 U/L (ref 15–41)
Albumin: 4 g/dL (ref 3.5–5.0)
Alkaline Phosphatase: 98 U/L (ref 38–126)
Anion gap: 4 — ABNORMAL LOW (ref 5–15)
BUN: 13 mg/dL (ref 6–20)
CHLORIDE: 108 mmol/L (ref 98–111)
CO2: 25 mmol/L (ref 22–32)
CREATININE: 0.78 mg/dL (ref 0.61–1.24)
Calcium: 9.1 mg/dL (ref 8.9–10.3)
GFR calc Af Amer: >60 mL/min (ref >60)
GFR calc non Af Amer: >60 mL/min (ref >60)
Glucose, Bld: 89 mg/dL (ref 70–99)
Potassium: 4.2 mmol/L (ref 3.5–5.1)
Sodium: 137 mmol/L (ref 135–145)
Total Bilirubin: 0.5 mg/dL (ref 0.3–1.2)
Total Protein: 7.7 g/dL (ref 6.5–8.1)

## 2018-05-18 MED ORDER — DEXAMETHASONE SODIUM PHOSPHATE 10 MG/ML IJ SOLN
10.0000 mg | Freq: Once | INTRAMUSCULAR | Status: AC
  Administered 2018-05-18: 10 mg via INTRAVENOUS
  Filled 2018-05-18: qty 1

## 2018-05-18 MED ORDER — LEUCOVORIN CALCIUM INJECTION 350 MG
800.0000 mg | Freq: Once | INTRAVENOUS | Status: AC
  Administered 2018-05-18: 800 mg via INTRAVENOUS
  Filled 2018-05-18: qty 25

## 2018-05-18 MED ORDER — FLUOROURACIL CHEMO INJECTION 2.5 GM/50ML
400.0000 mg/m2 | Freq: Once | INTRAVENOUS | Status: AC
  Administered 2018-05-18: 800 mg via INTRAVENOUS
  Filled 2018-05-18: qty 16

## 2018-05-18 MED ORDER — DEXTROSE 5 % IV SOLN
Freq: Once | INTRAVENOUS | Status: AC
  Administered 2018-05-18: 10:00:00 via INTRAVENOUS
  Filled 2018-05-18: qty 250

## 2018-05-18 MED ORDER — OXALIPLATIN CHEMO INJECTION 100 MG/20ML
150.0000 mg | Freq: Once | INTRAVENOUS | Status: AC
  Administered 2018-05-18: 150 mg via INTRAVENOUS
  Filled 2018-05-18: qty 20

## 2018-05-18 MED ORDER — SODIUM CHLORIDE 0.9 % IV SOLN
2400.0000 mg/m2 | INTRAVENOUS | Status: DC
  Administered 2018-05-18: 4900 mg via INTRAVENOUS
  Filled 2018-05-18: qty 98

## 2018-05-18 MED ORDER — SODIUM CHLORIDE 0.9% FLUSH
10.0000 mL | INTRAVENOUS | Status: DC | PRN
  Administered 2018-05-18: 10 mL via INTRAVENOUS
  Filled 2018-05-18: qty 10

## 2018-05-18 MED ORDER — PALONOSETRON HCL INJECTION 0.25 MG/5ML
0.2500 mg | Freq: Once | INTRAVENOUS | Status: AC
  Administered 2018-05-18: 0.25 mg via INTRAVENOUS
  Filled 2018-05-18: qty 5

## 2018-05-18 MED ORDER — CAPECITABINE 500 MG PO TABS: 825.0000 mg/m2 | ORAL_TABLET | Freq: Two times a day (BID) | ORAL | 0 refills | Status: DC

## 2018-05-18 MED ORDER — HEPARIN SOD (PORK) LOCK FLUSH 100 UNIT/ML IV SOLN: 500.0000 [IU] | Freq: Once | INTRAVENOUS | Status: DC

## 2018-05-18 NOTE — Progress Notes (Signed)
Hematology/Oncology follow-up note [facility] [phone] Fax:([phone]   Patient Care Team: Katheren Shams as PCP - General [name], RN as Registered Nurse  REFERRING PROVIDER: Dr.Thomas CHIEF COMPLAINTS/REASON FOR VISIT:  Follow-up for treatment of rectal cancer, assessment of tolerability of chemotherapy.  HISTORY OF PRESENTING ILLNESS:  Jeffery Mccormick is a  53 y.o.  male with PMH listed below who was referred to me for evaluation of rectal cancer.   He was evaluated by surgeon for evaluation of chronic rectal bleeding, ongoing chronic hemorrhoids.  12/25/2017 colonoscopy : revealed anal mass, 5-6 cm from the anal verge.  - Diverticulosis in the sigmoid colon. Biopsied. - A few 1 mm, non-bleeding polyps in the sigmoid colon, removed with a cold biopsy forceps. Resected and retrieved. Biopsied.  Biopsy showed  A. COLON POLYP, SIGMOID; COLD BIOPSY: - HYPERPLASTIC POLYP.  - NEGATIVE FOR DYSPLASIA AND MALIGNANCY.   B. RECTUM POLYP; COLD BIOPSY: - HYPERPLASTIC POLYP.  - NEGATIVE FOR DYSPLASIA AND MALIGNANCY.   C. RECTAL MASS; HOT SNARE:  - INVASIVE ADENOCARCINOMA, MODERATELY DIFFERENTIATED, ULCERATED.   # CT chest abdomen pelvis 01/16/2018 showed Wall thickening involving the lower rectum, corresponding to the patient's known rectal cancer. No findings specific for metastatic disease.  Scattered small bilateral pulmonary nodules measuring up to 3 mm, nonspecific but not considered overly suspicious for metastatic disease.   INTERVAL HISTORY Jeffery Mccormick is a 53 y.o. male who has above history reviewed by me today presents for assessment prior to cycle for neoadjuvant chemotherapy for rectal cancer cT3 N0 M0. On neoadjuvant chemotherapy with FOLFOX.  Reports tolerating chemotherapy well. Denies any nausea, or vomiting.  Denies diarrhea, abdominal pain or shortness of breath. Neuropathy, appears to be little worse.   Now he has persistent numbness and tingling of his fingertips and toes. Appetite is good.  Food does not taste well.   Review of Systems  Constitutional: Negative for chills, fever, malaise/fatigue and weight loss.  HENT: Negative for nosebleeds and sore throat.   Eyes: Negative for double vision, photophobia and redness.  Respiratory: Negative for cough, shortness of breath and wheezing.   Cardiovascular: Negative for chest pain, palpitations, orthopnea and leg swelling.  Gastrointestinal: Negative for abdominal pain, blood in stool, nausea and vomiting.  Genitourinary: Negative for dysuria.  Musculoskeletal: Negative for back pain, myalgias and neck pain.  Skin: Negative for itching and rash.  Neurological: Positive for tingling. Negative for dizziness and tremors.  Endo/Heme/Allergies: Negative for environmental allergies. Does not bruise/bleed easily.  Psychiatric/Behavioral: Negative for depression and hallucinations.    MEDICAL HISTORY:  Past Medical History:  Diagnosis Date  . Hemorrhoid prolapse     SURGICAL HISTORY: Past Surgical History:  Procedure Laterality Date  . COLONOSCOPY WITH PROPOFOL N/A 12/25/2017   Procedure: COLONOSCOPY WITH PROPOFOL;  Surgeon: Benjamine Sprague, DO;  Location: [place] [facility];  Service: General;  Laterality: N/A;  . HERNIA REPAIR  07/2013   Right Inguinal-Dr. Burt Knack  . PORTACATH PLACEMENT Right 02/03/2018   Procedure: INSERTION PORT-A-CATH;  Surgeon: Benjamine Sprague, DO;  Location: [facility];  Service: General;  Laterality: Right;    SOCIAL HISTORY: Social History   Socioeconomic History  . Marital status: Married    Spouse name: Not on file  . Number of children: Not on file  . Years of education: Not on file  . Highest education level: Not on file  Occupational History  . Occupation: unemployed  Social Needs  . Financial resource strain: Not on file  .  Food insecurity:    Worry: Not on file    Inability: Not on file  . Transportation  needs:    Medical: Not on file    Non-medical: Not on file  Tobacco Use  . Smoking status: Current Every Day Smoker    Packs/day: 1.00    Years: 30.00    Pack years: 30.00    Types: Cigarettes  . Smokeless tobacco: Never Used  Substance and Sexual Activity  . Alcohol use: Yes    Frequency: Never    Comment: occasionally  . Drug use: No  . Sexual activity: Not on file  Lifestyle  . Physical activity:    Days per week: Not on file    Minutes per session: Not on file  . Stress: Not on file  Relationships  . Social connections:    Talks on phone: Not on file    Gets together: Not on file    Attends religious service: Not on file    Active member of club or organization: Not on file    Attends meetings of clubs or organizations: Not on file    Relationship status: Not on file  . Intimate partner violence:    Fear of current or ex partner: Not on file    Emotionally abused: Not on file    Physically abused: Not on file    Forced sexual activity: Not on file  Other Topics Concern  . Not on file  Social History Narrative  . Not on file    FAMILY HISTORY: Family History  Family history unknown: Yes  patient reports that he does not know any family history.   ALLERGIES:  has No Known Allergies.  MEDICATIONS:  Current Outpatient Medications  Medication Sig Dispense Refill  . clotrimazole-betamethasone (LOTRISONE) cream Apply 1 application topically 2 (two) times daily. 30 g 0  . HYDROcodone-acetaminophen (NORCO) 5-325 MG tablet Take 1 tablet by mouth every 6 (six) hours as needed for up to 5 doses for moderate pain. 5 tablet 0  . ibuprofen (ADVIL,MOTRIN) 800 MG tablet Take 1 tablet (800 mg total) by mouth every 8 (eight) hours as needed for mild pain or moderate pain. 30 tablet 0  . lidocaine-prilocaine (EMLA) cream Apply to affected area once 30 g 3  . loperamide (IMODIUM) 2 MG capsule Take 1 capsule (2 mg total) by mouth See admin instructions. With onset of loose stool,  take 4mg  followed by 2mg  every 2 hours until 12 hours have passed without loose bowel movement. Maximum: 16 mg/day 120 capsule 1  . nystatin (MYCOSTATIN) 100000 UNIT/ML suspension Take 5 mLs (500,000 Units total) by mouth 3 (three) times daily. Swish and spit. 60 mL 0  . nystatin (MYCOSTATIN/NYSTOP) powder Apply topically 4 (four) times daily. 15 g 0  . ondansetron (ZOFRAN) 8 MG tablet Take 1 tablet (8 mg total) by mouth 2 (two) times daily as needed for refractory nausea / vomiting. Start on day 3 after chemotherapy. 30 tablet 1  . polyethylene glycol (MIRALAX / GLYCOLAX) packet Take 17 g by mouth daily as needed for moderate constipation.     . prochlorperazine (COMPAZINE) 10 MG tablet Take 1 tablet (10 mg total) by mouth every 6 (six) hours as needed (Nausea or vomiting). 30 tablet 1   No current facility-administered medications for this visit.    RADIOGRAPHIC STUDIES: I have personally reviewed the radiological images as listed and agreed with the findings in the report. 01/23/2018 MRI pelvis with and without contrast Low rectal/anorectal adenocarcinoma  T stage:  Early T3 Rectal adenocarcinoma N stage:  N0 Distance from tumor to the anal sphincter is minimal. The tumor extends to or just above the insertion of the external sphincter.  PHYSICAL EXAMINATION: ECOG PERFORMANCE STATUS: 1 - Symptomatic but completely ambulatory Vitals:   05/18/18 0905  BP: 135/85  Temp: (!) 97.2 F (36.2 C)   Filed Weights   05/18/18 0905  Weight: 178 lb 11.2 oz (81.1 kg)    Physical Exam Constitutional:      General: He is not in acute distress. HENT:     Head: Normocephalic and atraumatic.  Eyes:     General: No scleral icterus.    Pupils: Pupils are equal, round, and reactive to light.  Neck:     Musculoskeletal: Normal range of motion and neck supple.  Cardiovascular:     Rate and Rhythm: Normal rate and regular rhythm.     Heart sounds: Normal heart sounds.  Pulmonary:     Effort:  Pulmonary effort is normal. No respiratory distress.     Breath sounds: No wheezing.  Abdominal:     General: Bowel sounds are normal. There is no distension.     Palpations: Abdomen is soft. There is no mass.     Tenderness: There is no abdominal tenderness.  Musculoskeletal: Normal range of motion.        General: No deformity.  Skin:    General: Skin is warm and dry.     Findings: No erythema or rash.  Neurological:     Mental Status: He is alert and oriented to person, place, and time.     Cranial Nerves: No cranial nerve deficit.     Coordination: Coordination normal.  Psychiatric:        Behavior: Behavior normal.        Thought Content: Thought content normal.      LABORATORY DATA:  I have reviewed the data as listed Lab Results  Component Value Date   WBC 5.9 05/18/2018   HGB 13.0 05/18/2018   HCT 37.8 (L) 05/18/2018   MCV 101.3 (H) 05/18/2018   PLT 104 (L) 05/18/2018   Recent Labs    04/20/18 0826 05/04/18 0818 05/18/18 0845  NA 139 139 137  K 4.1 4.3 4.2  CL 108 109 108  CO2 25 23 25   GLUCOSE 107* 107* 89  BUN 10 11 13   CREATININE 0.83 0.89 0.78  CALCIUM 9.2 9.0 9.1  GFRNONAA >60 >60 >60  GFRAA >60 >60 >60  PROT 7.4 7.2 7.7  ALBUMIN 4.0 3.8 4.0  AST 43* 30 27  ALT 70* 35 32  ALKPHOS 87 90 98  BILITOT 0.6 0.4 0.5   Iron/TIBC/Ferritin/ %Sat No results found for: IRON, TIBC, FERRITIN, IRONPCTSAT   01/29/2018 baseline CEA 11.6   ASSESSMENT & PLAN:  1. Rectal cancer ([place])   2. 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 Thrombocytopenia ([place])   Cancer Staging Rectal cancer [facility]) Staging form: Colon and Rectum, AJCC 8th Edition - Clinical stage from 01/29/2018: Stage IIA (cT3, cN0, cM0) - Signed by Earlie Server, MD on 02/01/2018  #Low rectal cancer, plan total neoadjuvant therapy.chemotherapy regimen with FOLFOX Q2w x 4 months, followed by concurrent Xeloda with RT,  Tolerates FOLFOX well  except neuropathy.  Labs reviewed and discussed with patient.  Proceed with FOLFOX plus dose reduction.Marland Kitchen  #Neuropathy secondary to chemotherapy.  Reduce oxaliplatin to 75 mg/m prescription #Grade 1 thrombocytopenia, improved since  last visit.  Continue to monitor. He will finish total 8 cycles of chemotherapy for today Patient needs to evaluated by radiation oncology for chemo and radiation. Plan to proceed with Xeloda 825mg  mg/m2 BID on the days with radiation.  #Mucositis, continue Magic mouthwash as instructed. We spent sufficient time to discuss many aspect of care, questions were answered to patient's satisfaction. Total face to face encounter time for this patient visit was 25 min. >50% of the time was  spent in counseling and coordination of care.   Return of visit: to be determined.   Earlie Server, MD, PhD Hematology Oncology [facility] at [facility] Pager- [phone] 05/18/2018

## 2018-05-18 NOTE — Telephone Encounter (Signed)
Oral Oncology Pharmacist Encounter  Received new prescription for Xeloda (capecitabine) for the treatment of stage IIA rectal cancer in conjunction with RT, planned duration until the end radiation.  CMP from 05/18/2018 assessed, no relevant lab abnormalities. Prescription dose and frequency assessed.   Current medication list in Epic reviewed, no relevant DDIs with capecitabine identified.  Prescription has been e-scribed to the [facility] for benefits analysis and approval.  Patient education Counseled patient in infusion on administration, dosing, side effects, monitoring, drug-food interactions, safe handling, storage, and disposal.  Side effects include but not limited to: diarrhea, hand-foot syndrome, N/V, fatigue, alopecia, mouth sores, fluid retention.    Reviewed with patient importance of keeping a medication schedule and plan for any missed doses.  Jeffery Mccormick voiced understanding and appreciation. All questions answered.  Oral Oncology Clinic will continue to follow for insurance authorization, copayment issues, and start date.  Provided patient with [facility] [place] [facility] phone number. Patient knows to call the office with questions or concerns. Oral Chemotherapy Navigation Clinic will continue to follow.  Darl Pikes, PharmD, BCPS, [facility] Hematology/Oncology Clinical Pharmacist [facility] [place] [facility] [phone]  05/18/2018 9:39 AM

## 2018-05-19 LAB — CEA: CEA: 3.4 ng/mL (ref 0.0–4.7)

## 2018-05-20 ENCOUNTER — Other Ambulatory Visit: Payer: Self-pay

## 2018-05-20 ENCOUNTER — Ambulatory Visit
Admission: RE | Admit: 2018-05-20 | Discharge: 2018-05-20 | Disposition: A | Discharge status: Home / Self Care | Payer: Self-pay | Source: Ambulatory Visit | Attending: Radiation Oncology | Admitting: Radiation Oncology

## 2018-05-20 ENCOUNTER — Inpatient Hospital Stay: Payer: Medicaid Other

## 2018-05-20 ENCOUNTER — Encounter: Payer: Self-pay | Admitting: Radiation Oncology

## 2018-05-20 VITALS — BP 121/72 | HR 87 | Temp 96.0°F | Resp 18 | Wt 181.9 lb

## 2018-05-20 DIAGNOSIS — Z5111 Encounter for antineoplastic chemotherapy: Secondary | ICD-10-CM | POA: Diagnosis not present

## 2018-05-20 DIAGNOSIS — C801 Malignant (primary) neoplasm, unspecified: Secondary | ICD-10-CM

## 2018-05-20 DIAGNOSIS — R197 Diarrhea, unspecified: Secondary | ICD-10-CM | POA: Insufficient documentation

## 2018-05-20 DIAGNOSIS — C20 Malignant neoplasm of rectum: Secondary | ICD-10-CM | POA: Insufficient documentation

## 2018-05-20 DIAGNOSIS — Z9221 Personal history of antineoplastic chemotherapy: Secondary | ICD-10-CM | POA: Insufficient documentation

## 2018-05-20 MED ORDER — SODIUM CHLORIDE 0.9% FLUSH
10.0000 mL | INTRAVENOUS | Status: DC | PRN
  Administered 2018-05-20: 10 mL via INTRAVENOUS
  Filled 2018-05-20: qty 10

## 2018-05-20 MED ORDER — HEPARIN SOD (PORK) LOCK FLUSH 100 UNIT/ML IV SOLN
INTRAVENOUS | Status: AC
  Filled 2018-05-20: qty 5

## 2018-05-20 MED ORDER — HEPARIN SOD (PORK) LOCK FLUSH 100 UNIT/ML IV SOLN
500.0000 [IU] | Freq: Once | INTRAVENOUS | Status: AC
  Administered 2018-05-20: 500 [IU] via INTRAVENOUS

## 2018-05-20 NOTE — Progress Notes (Signed)
Radiation Oncology Follow up Note  Name: Jeffery Mccormick   Date:   05/20/2018 MRN:  [id number] DOB: [birth year]    This 53 y.o. 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  REFERRING PROVIDER: Earlie Server, MD  HPI: patient was initially consult in October 2019 when he presented with a distal rectal stage IIa adenocarcinoma..colonoscopy revealed anal mass 5-6 cm from the anal verge.iopsy was positive for moderately differentiated 18 invasive adenocarcinoma.over the past 4 months he is undergone FOLFOX chemotherapy which is tolerated fairly well has some fairly significant diarrhea which she's used Imodium for. He's have knowing rectal pain or rectal bleeding. He is now referred back to revision oncology for concurrent Xeloda and radiation therapy prior to surgery.  COMPLICATIONS OF TREATMENT: none  FOLLOW UP COMPLIANCE: keeps appointments   PHYSICAL EXAM:  BP 121/72 (BP Location: Left Arm, Patient Position: Sitting)   Pulse 87   Temp (!) 96 F (35.6 C) (Tympanic)   Resp 18   Wt 181 lb 14.1 oz (82.5 kg)   BMI 26.10 kg/m  Well-developed well-nourished patient in NAD. HEENT reveals PERLA, EOMI, discs not visualized.  Oral cavity is clear. No oral mucosal lesions are identified. Neck is clear without evidence of cervical or supraclavicular adenopathy. Lungs are clear to A&P. Cardiac examination is essentially unremarkable with regular rate and rhythm without murmur rub or thrill. Abdomen is benign with no organomegaly or masses noted. Motor sensory and DTR levels are equal and symmetric in the upper and lower extremities. Cranial nerves II through XII are grossly intact. Proprioception is intact. No peripheral adenopathy or edema is identified. No motor or sensory levels are noted. Crude visual fields are within normal range.  RADIOLOGY RESULTS: previous CT scans of abdomen and pelvis  reviewed  PLAN: present time would like to go ahead with radiation therapy to his whole pelvis. Would plan on delivering 4500 cGy in 25 fractions. Would also boost his distal rectum another 540 cGy. Risks and benefits of treatment including diarrhea fatigue alteration of blood counts possible increased lower urinary tract symptoms all were discussed in detail with the patient. Both he and his significant other copperhead my treatment plan well. I have personally set up and ordered CT simulation early next week.There will be extra effort by both professional staff as well as technical staff to coordinate and manage concurrent chemoradiation and ensuing side effects during his treatments.   I would like to take this opportunity to thank you for allowing me to participate in the care of your patient.Noreene Filbert, MD  acro thanks

## 2018-05-21 MED ORDER — CAPECITABINE 500 MG PO TABS: 825.0000 mg/m2 | ORAL_TABLET | Freq: Two times a day (BID) | ORAL | 0 refills | Status: DC

## 2018-05-26 ENCOUNTER — Ambulatory Visit
Admission: RE | Admit: 2018-05-26 | Discharge: 2018-05-26 | Disposition: A | Discharge status: Home / Self Care | Payer: Medicaid Other | Source: Ambulatory Visit | Attending: Radiation Oncology | Admitting: Radiation Oncology

## 2018-05-26 DIAGNOSIS — Z510 Encounter for antineoplastic radiation therapy: Secondary | ICD-10-CM | POA: Insufficient documentation

## 2018-05-26 DIAGNOSIS — C20 Malignant neoplasm of rectum: Secondary | ICD-10-CM | POA: Insufficient documentation

## 2018-05-27 DIAGNOSIS — Z510 Encounter for antineoplastic radiation therapy: Secondary | ICD-10-CM | POA: Diagnosis not present

## 2018-05-27 MED FILL — XELODA 500 MG TABLET: 500 | 38 days supply | Qty: 168 | Fill #0

## 2018-05-29 ENCOUNTER — Other Ambulatory Visit: Payer: Self-pay | Admitting: *Deleted

## 2018-05-29 DIAGNOSIS — C20 Malignant neoplasm of rectum: Secondary | ICD-10-CM

## 2018-06-02 ENCOUNTER — Ambulatory Visit
Admission: RE | Admit: 2018-06-02 | Discharge: 2018-06-02 | Disposition: A | Discharge status: Home / Self Care | Payer: Medicaid Other | Source: Ambulatory Visit | Attending: Radiation Oncology | Admitting: Radiation Oncology

## 2018-06-02 DIAGNOSIS — Z510 Encounter for antineoplastic radiation therapy: Secondary | ICD-10-CM | POA: Diagnosis not present

## 2018-06-03 ENCOUNTER — Ambulatory Visit
Admission: RE | Admit: 2018-06-03 | Discharge: 2018-06-03 | Disposition: A | Discharge status: Home / Self Care | Payer: Medicaid Other | Source: Ambulatory Visit | Attending: Radiation Oncology | Admitting: Radiation Oncology

## 2018-06-03 DIAGNOSIS — Z510 Encounter for antineoplastic radiation therapy: Secondary | ICD-10-CM | POA: Diagnosis not present

## 2018-06-04 ENCOUNTER — Ambulatory Visit
Admission: RE | Admit: 2018-06-04 | Discharge: 2018-06-04 | Disposition: A | Discharge status: Home / Self Care | Payer: Medicaid Other | Source: Ambulatory Visit | Attending: Radiation Oncology | Admitting: Radiation Oncology

## 2018-06-04 DIAGNOSIS — Z510 Encounter for antineoplastic radiation therapy: Secondary | ICD-10-CM | POA: Diagnosis not present

## 2018-06-05 ENCOUNTER — Ambulatory Visit
Admission: RE | Admit: 2018-06-05 | Discharge: 2018-06-05 | Disposition: A | Discharge status: Home / Self Care | Payer: Medicaid Other | Source: Ambulatory Visit | Attending: Radiation Oncology | Admitting: Radiation Oncology

## 2018-06-05 DIAGNOSIS — Z510 Encounter for antineoplastic radiation therapy: Secondary | ICD-10-CM | POA: Diagnosis not present

## 2018-06-08 ENCOUNTER — Ambulatory Visit
Admission: RE | Admit: 2018-06-08 | Discharge: 2018-06-08 | Disposition: A | Discharge status: Home / Self Care | Payer: Medicaid Other | Source: Ambulatory Visit | Attending: Radiation Oncology | Admitting: Radiation Oncology

## 2018-06-08 DIAGNOSIS — Z510 Encounter for antineoplastic radiation therapy: Secondary | ICD-10-CM | POA: Diagnosis present

## 2018-06-08 DIAGNOSIS — C20 Malignant neoplasm of rectum: Secondary | ICD-10-CM | POA: Insufficient documentation

## 2018-06-09 ENCOUNTER — Ambulatory Visit
Admission: RE | Admit: 2018-06-09 | Discharge: 2018-06-09 | Disposition: A | Discharge status: Home / Self Care | Payer: Medicaid Other | Source: Ambulatory Visit | Attending: Radiation Oncology | Admitting: Radiation Oncology

## 2018-06-09 DIAGNOSIS — Z510 Encounter for antineoplastic radiation therapy: Secondary | ICD-10-CM | POA: Diagnosis not present

## 2018-06-10 ENCOUNTER — Ambulatory Visit
Admission: RE | Admit: 2018-06-10 | Discharge: 2018-06-10 | Disposition: A | Discharge status: Home / Self Care | Payer: Medicaid Other | Source: Ambulatory Visit | Attending: Radiation Oncology | Admitting: Radiation Oncology

## 2018-06-10 DIAGNOSIS — Z510 Encounter for antineoplastic radiation therapy: Secondary | ICD-10-CM | POA: Diagnosis not present

## 2018-06-11 ENCOUNTER — Ambulatory Visit
Admission: RE | Admit: 2018-06-11 | Discharge: 2018-06-11 | Disposition: A | Discharge status: Home / Self Care | Payer: Medicaid Other | Source: Ambulatory Visit | Attending: Radiation Oncology | Admitting: Radiation Oncology

## 2018-06-11 DIAGNOSIS — Z510 Encounter for antineoplastic radiation therapy: Secondary | ICD-10-CM | POA: Diagnosis not present

## 2018-06-12 ENCOUNTER — Ambulatory Visit
Admission: RE | Admit: 2018-06-12 | Discharge: 2018-06-12 | Disposition: A | Discharge status: Home / Self Care | Payer: Medicaid Other | Source: Ambulatory Visit | Attending: Radiation Oncology | Admitting: Radiation Oncology

## 2018-06-12 DIAGNOSIS — Z510 Encounter for antineoplastic radiation therapy: Secondary | ICD-10-CM | POA: Diagnosis not present

## 2018-06-15 ENCOUNTER — Ambulatory Visit
Admission: RE | Admit: 2018-06-15 | Discharge: 2018-06-15 | Disposition: A | Discharge status: Home / Self Care | Payer: Medicaid Other | Source: Ambulatory Visit | Attending: Radiation Oncology | Admitting: Radiation Oncology

## 2018-06-15 ENCOUNTER — Inpatient Hospital Stay: Payer: Medicaid Other | Attending: Oncology

## 2018-06-15 DIAGNOSIS — F17210 Nicotine dependence, cigarettes, uncomplicated: Secondary | ICD-10-CM | POA: Diagnosis not present

## 2018-06-15 DIAGNOSIS — Z79899 Other long term (current) drug therapy: Secondary | ICD-10-CM | POA: Insufficient documentation

## 2018-06-15 DIAGNOSIS — C20 Malignant neoplasm of rectum: Secondary | ICD-10-CM | POA: Insufficient documentation

## 2018-06-15 DIAGNOSIS — Z510 Encounter for antineoplastic radiation therapy: Secondary | ICD-10-CM | POA: Diagnosis not present

## 2018-06-15 DIAGNOSIS — G620 Drug-induced polyneuropathy: Secondary | ICD-10-CM | POA: Insufficient documentation

## 2018-06-15 DIAGNOSIS — R3912 Poor urinary stream: Secondary | ICD-10-CM | POA: Diagnosis not present

## 2018-06-15 LAB — CBC
HCT: 37.2 % — ABNORMAL LOW (ref 39.0–52.0)
Hemoglobin: 12.8 g/dL — ABNORMAL LOW (ref 13.0–17.0)
MCH: 37 pg — ABNORMAL HIGH (ref 26.0–34.0)
MCHC: 34.4 g/dL (ref 30.0–36.0)
MCV: 107.5 fL — ABNORMAL HIGH (ref 80.0–100.0)
Platelets: 130 10*3/uL — ABNORMAL LOW (ref 150–400)
RBC: 3.46 MIL/uL — ABNORMAL LOW (ref 4.22–5.81)
RDW: 16.3 % — ABNORMAL HIGH (ref 11.5–15.5)
WBC: 6.6 10*3/uL (ref 4.0–10.5)
nRBC: 0 % (ref 0.0–0.2)

## 2018-06-16 ENCOUNTER — Ambulatory Visit
Admission: RE | Admit: 2018-06-16 | Discharge: 2018-06-16 | Disposition: A | Discharge status: Home / Self Care | Payer: Medicaid Other | Source: Ambulatory Visit | Attending: Radiation Oncology | Admitting: Radiation Oncology

## 2018-06-16 DIAGNOSIS — Z510 Encounter for antineoplastic radiation therapy: Secondary | ICD-10-CM | POA: Diagnosis not present

## 2018-06-17 ENCOUNTER — Ambulatory Visit
Admission: RE | Admit: 2018-06-17 | Discharge: 2018-06-17 | Disposition: A | Discharge status: Home / Self Care | Payer: Medicaid Other | Source: Ambulatory Visit | Attending: Radiation Oncology | Admitting: Radiation Oncology

## 2018-06-17 DIAGNOSIS — Z510 Encounter for antineoplastic radiation therapy: Secondary | ICD-10-CM | POA: Diagnosis not present

## 2018-06-18 ENCOUNTER — Ambulatory Visit
Admission: RE | Admit: 2018-06-18 | Discharge: 2018-06-18 | Disposition: A | Discharge status: Home / Self Care | Payer: Medicaid Other | Source: Ambulatory Visit | Attending: Radiation Oncology | Admitting: Radiation Oncology

## 2018-06-18 ENCOUNTER — Other Ambulatory Visit: Payer: Self-pay

## 2018-06-18 DIAGNOSIS — Z510 Encounter for antineoplastic radiation therapy: Secondary | ICD-10-CM | POA: Diagnosis not present

## 2018-06-19 ENCOUNTER — Other Ambulatory Visit: Payer: Self-pay

## 2018-06-19 ENCOUNTER — Ambulatory Visit
Admission: RE | Admit: 2018-06-19 | Discharge: 2018-06-19 | Disposition: A | Discharge status: Home / Self Care | Payer: Medicaid Other | Source: Ambulatory Visit | Attending: Radiation Oncology | Admitting: Radiation Oncology

## 2018-06-19 DIAGNOSIS — Z510 Encounter for antineoplastic radiation therapy: Secondary | ICD-10-CM | POA: Diagnosis not present

## 2018-06-22 ENCOUNTER — Inpatient Hospital Stay: Payer: Medicaid Other

## 2018-06-22 ENCOUNTER — Other Ambulatory Visit: Payer: Self-pay

## 2018-06-22 ENCOUNTER — Other Ambulatory Visit: Payer: Self-pay | Admitting: Oncology

## 2018-06-22 ENCOUNTER — Ambulatory Visit
Admission: RE | Admit: 2018-06-22 | Discharge: 2018-06-22 | Disposition: A | Discharge status: Home / Self Care | Payer: Medicaid Other | Source: Ambulatory Visit | Attending: Radiation Oncology | Admitting: Radiation Oncology

## 2018-06-22 DIAGNOSIS — Z510 Encounter for antineoplastic radiation therapy: Secondary | ICD-10-CM | POA: Diagnosis not present

## 2018-06-22 DIAGNOSIS — C20 Malignant neoplasm of rectum: Secondary | ICD-10-CM

## 2018-06-22 NOTE — Telephone Encounter (Signed)
)     Ref Range & Units 7d ago (06/15/18) 67mo ago (05/18/18) 46mo ago (05/04/18) 43mo ago (04/20/18) 1mo ago (04/06/18) 82mo ago (03/23/18) 11mo ago (03/09/18)  WBC 4.0 - 10.5 K/uL 6.6  5.9  5.2  5.4  5.2  6.0  6.6   RBC 4.22 - 5.81 MIL/uL 3.46Low   3.73Low   3.69Low   3.79Low   4.03Low   4.24  4.40   Hemoglobin 13.0 - 17.0 g/dL 12.8Low   13.0  12.8Low   12.9Low   13.3  13.7  14.4   HCT 39.0 - 52.0 % 37.2Low   37.8Low   36.7Low   36.6Low   38.4Low   39.6  41.2   MCV 80.0 - 100.0 fL 107.5High   101.3High   99.5  96.6  95.3  93.4  93.6   MCH 26.0 - 34.0 pg 37.0High   34.9High   34.7High   34.0  33.0  32.3  32.7   MCHC 30.0 - 36.0 g/dL 34.4  34.4  34.9  35.2  34.6  34.6  35.0   RDW 11.5 - 15.5 % 16.3High   18.9High   18.6High   17.1High   15.5  14.3  13.2   Platelets 150 - 400 K/uL 130Low   104Low   87Low   123Low   146Low   159  163   nRBC 0.0 - 0.2 % 0.0  0.0  0.0  0.0  0.0  0.0  0.0   Comment: Performed at [facility], [place], [place] [address]  Neutrophils Relative %   43  48  44  45  51  48   Basophils Absolute   0.0  0.0  0.0  0.0  0.0  0.0   Immature Granulocytes   0  0  0  0  0  0   Abs Immature Granulocytes   0.01 CM 0.01 CM 0.01 CM 0.01 CM 0.00 CM 0.01 CM  Neutro Abs   2.6  2.5  2.4  2.3  3.1  3.2   Lymphocytes Relative   35  32  35  37  32  33   Lymphs Abs   2.1  1.7  1.9  1.9  1.9  2.2   Monocytes Relative   20  18  19  16  15  16    Monocytes Absolute   1.2High   0.9  1.0  0.8  0.9  1.1High    Eosinophils Relative   1  1  1  1  1  2    Eosinophils Absolute   0.1  0.1  0.0  0.1  0.1  0.1   Basophils Relative   1  1  1  1  1  1    Resulting Agency  [facility] [facility] [place] [facility] [place] [facility] [place] [facility] [place] [facility] [place] [facility] [place] [facility]      Specimen Collected: 06/15/18 10:44  Last Resulted: 06/15/18 11:01

## 2018-06-23 ENCOUNTER — Ambulatory Visit
Admission: RE | Admit: 2018-06-23 | Discharge: 2018-06-23 | Disposition: A | Discharge status: Home / Self Care | Payer: Medicaid Other | Source: Ambulatory Visit | Attending: Radiation Oncology | Admitting: Radiation Oncology

## 2018-06-23 ENCOUNTER — Other Ambulatory Visit: Payer: Self-pay

## 2018-06-23 ENCOUNTER — Inpatient Hospital Stay: Payer: Medicaid Other

## 2018-06-23 DIAGNOSIS — C20 Malignant neoplasm of rectum: Secondary | ICD-10-CM

## 2018-06-23 DIAGNOSIS — Z510 Encounter for antineoplastic radiation therapy: Secondary | ICD-10-CM | POA: Diagnosis not present

## 2018-06-23 LAB — CBC
HCT: 35.5 % — ABNORMAL LOW (ref 39.0–52.0)
Hemoglobin: 12.6 g/dL — ABNORMAL LOW (ref 13.0–17.0)
MCH: 38.4 pg — ABNORMAL HIGH (ref 26.0–34.0)
MCHC: 35.5 g/dL (ref 30.0–36.0)
MCV: 108.2 fL — ABNORMAL HIGH (ref 80.0–100.0)
Platelets: 151 10*3/uL (ref 150–400)
RBC: 3.28 MIL/uL — ABNORMAL LOW (ref 4.22–5.81)
RDW: 16 % — ABNORMAL HIGH (ref 11.5–15.5)
WBC: 5.6 10*3/uL (ref 4.0–10.5)
nRBC: 0 % (ref 0.0–0.2)

## 2018-06-24 ENCOUNTER — Other Ambulatory Visit: Payer: Self-pay

## 2018-06-24 ENCOUNTER — Ambulatory Visit
Admission: RE | Admit: 2018-06-24 | Discharge: 2018-06-24 | Disposition: A | Discharge status: Home / Self Care | Payer: Medicaid Other | Source: Ambulatory Visit | Attending: Radiation Oncology | Admitting: Radiation Oncology

## 2018-06-24 DIAGNOSIS — Z510 Encounter for antineoplastic radiation therapy: Secondary | ICD-10-CM | POA: Diagnosis not present

## 2018-06-25 ENCOUNTER — Ambulatory Visit
Admission: RE | Admit: 2018-06-25 | Discharge: 2018-06-25 | Disposition: A | Discharge status: Home / Self Care | Payer: Medicaid Other | Source: Ambulatory Visit | Attending: Radiation Oncology | Admitting: Radiation Oncology

## 2018-06-25 ENCOUNTER — Other Ambulatory Visit: Payer: Self-pay

## 2018-06-25 DIAGNOSIS — Z510 Encounter for antineoplastic radiation therapy: Secondary | ICD-10-CM | POA: Diagnosis not present

## 2018-06-26 ENCOUNTER — Other Ambulatory Visit: Payer: Self-pay

## 2018-06-26 ENCOUNTER — Ambulatory Visit
Admission: RE | Admit: 2018-06-26 | Discharge: 2018-06-26 | Disposition: A | Discharge status: Home / Self Care | Payer: Medicaid Other | Source: Ambulatory Visit | Attending: Radiation Oncology | Admitting: Radiation Oncology

## 2018-06-26 DIAGNOSIS — Z510 Encounter for antineoplastic radiation therapy: Secondary | ICD-10-CM | POA: Diagnosis not present

## 2018-06-28 ENCOUNTER — Other Ambulatory Visit: Payer: Self-pay

## 2018-06-29 ENCOUNTER — Other Ambulatory Visit: Payer: Self-pay

## 2018-06-29 ENCOUNTER — Inpatient Hospital Stay: Payer: Medicaid Other

## 2018-06-29 ENCOUNTER — Ambulatory Visit
Admission: RE | Admit: 2018-06-29 | Discharge: 2018-06-29 | Disposition: A | Discharge status: Home / Self Care | Payer: Medicaid Other | Source: Ambulatory Visit | Attending: Radiation Oncology | Admitting: Radiation Oncology

## 2018-06-29 DIAGNOSIS — C20 Malignant neoplasm of rectum: Secondary | ICD-10-CM

## 2018-06-29 DIAGNOSIS — Z510 Encounter for antineoplastic radiation therapy: Secondary | ICD-10-CM | POA: Diagnosis not present

## 2018-06-29 LAB — CBC
HCT: 37.1 % — ABNORMAL LOW (ref 39.0–52.0)
Hemoglobin: 13.1 g/dL (ref 13.0–17.0)
MCH: 38.2 pg — ABNORMAL HIGH (ref 26.0–34.0)
MCHC: 35.3 g/dL (ref 30.0–36.0)
MCV: 108.2 fL — ABNORMAL HIGH (ref 80.0–100.0)
PLATELETS: 156 10*3/uL (ref 150–400)
RBC: 3.43 MIL/uL — ABNORMAL LOW (ref 4.22–5.81)
RDW: 16 % — ABNORMAL HIGH (ref 11.5–15.5)
WBC: 5.6 10*3/uL (ref 4.0–10.5)
nRBC: 0 % (ref 0.0–0.2)

## 2018-06-30 ENCOUNTER — Ambulatory Visit
Admission: RE | Admit: 2018-06-30 | Discharge: 2018-06-30 | Disposition: A | Discharge status: Home / Self Care | Payer: Medicaid Other | Source: Ambulatory Visit | Attending: Radiation Oncology | Admitting: Radiation Oncology

## 2018-06-30 ENCOUNTER — Other Ambulatory Visit: Payer: Self-pay

## 2018-06-30 DIAGNOSIS — Z510 Encounter for antineoplastic radiation therapy: Secondary | ICD-10-CM | POA: Diagnosis not present

## 2018-07-01 ENCOUNTER — Ambulatory Visit
Admission: RE | Admit: 2018-07-01 | Discharge: 2018-07-01 | Disposition: A | Discharge status: Home / Self Care | Payer: Medicaid Other | Source: Ambulatory Visit | Attending: Radiation Oncology | Admitting: Radiation Oncology

## 2018-07-01 ENCOUNTER — Inpatient Hospital Stay: Payer: Medicaid Other

## 2018-07-01 ENCOUNTER — Inpatient Hospital Stay (HOSPITAL_BASED_OUTPATIENT_CLINIC_OR_DEPARTMENT_OTHER): Payer: Medicaid Other | Admitting: Oncology

## 2018-07-01 ENCOUNTER — Other Ambulatory Visit: Payer: Self-pay

## 2018-07-01 ENCOUNTER — Encounter: Payer: Self-pay | Admitting: Oncology

## 2018-07-01 VITALS — BP 130/78 | HR 85 | Temp 96.9°F | Resp 18 | Wt 187.4 lb

## 2018-07-01 DIAGNOSIS — Z95828 Presence of other vascular implants and grafts: Secondary | ICD-10-CM

## 2018-07-01 DIAGNOSIS — G620 Drug-induced polyneuropathy: Secondary | ICD-10-CM

## 2018-07-01 DIAGNOSIS — C20 Malignant neoplasm of rectum: Secondary | ICD-10-CM

## 2018-07-01 DIAGNOSIS — F17210 Nicotine dependence, cigarettes, uncomplicated: Secondary | ICD-10-CM

## 2018-07-01 DIAGNOSIS — R3912 Poor urinary stream: Secondary | ICD-10-CM

## 2018-07-01 DIAGNOSIS — Z5111 Encounter for antineoplastic chemotherapy: Secondary | ICD-10-CM

## 2018-07-01 DIAGNOSIS — Z79899 Other long term (current) drug therapy: Secondary | ICD-10-CM

## 2018-07-01 DIAGNOSIS — D539 Nutritional anemia, unspecified: Secondary | ICD-10-CM

## 2018-07-01 DIAGNOSIS — Z510 Encounter for antineoplastic radiation therapy: Secondary | ICD-10-CM | POA: Diagnosis not present

## 2018-07-01 LAB — COMPREHENSIVE METABOLIC PANEL
ALT: 19 U/L (ref 0–44)
AST: 21 U/L (ref 15–41)
Albumin: 3.8 g/dL (ref 3.5–5.0)
Alkaline Phosphatase: 89 U/L (ref 38–126)
Anion gap: 7 (ref 5–15)
BILIRUBIN TOTAL: 0.5 mg/dL (ref 0.3–1.2)
BUN: 10 mg/dL (ref 6–20)
CO2: 24 mmol/L (ref 22–32)
Calcium: 8.9 mg/dL (ref 8.9–10.3)
Chloride: 106 mmol/L (ref 98–111)
Creatinine, Ser: 0.83 mg/dL (ref 0.61–1.24)
GFR calc Af Amer: >60 mL/min (ref >60)
GFR calc non Af Amer: >60 mL/min (ref >60)
Glucose, Bld: 108 mg/dL — ABNORMAL HIGH (ref 70–99)
POTASSIUM: 3.8 mmol/L (ref 3.5–5.1)
Sodium: 137 mmol/L (ref 135–145)
TOTAL PROTEIN: 7.2 g/dL (ref 6.5–8.1)

## 2018-07-01 LAB — CBC WITH DIFFERENTIAL/PLATELET
ABS IMMATURE GRANULOCYTES: 0.01 10*3/uL (ref 0.00–0.07)
Basophils Absolute: 0 10*3/uL (ref 0.0–0.1)
Basophils Relative: 1 %
Eosinophils Absolute: 0.1 10*3/uL (ref 0.0–0.5)
Eosinophils Relative: 3 %
HCT: 34.7 % — ABNORMAL LOW (ref 39.0–52.0)
Hemoglobin: 12 g/dL — ABNORMAL LOW (ref 13.0–17.0)
Immature Granulocytes: 0 %
Lymphocytes Relative: 15 %
Lymphs Abs: 0.8 10*3/uL (ref 0.7–4.0)
MCH: 37.3 pg — ABNORMAL HIGH (ref 26.0–34.0)
MCHC: 34.6 g/dL (ref 30.0–36.0)
MCV: 107.8 fL — ABNORMAL HIGH (ref 80.0–100.0)
MONO ABS: 0.7 10*3/uL (ref 0.1–1.0)
MONOS PCT: 12 %
Neutro Abs: 3.7 10*3/uL (ref 1.7–7.7)
Neutrophils Relative %: 69 %
Platelets: 146 10*3/uL — ABNORMAL LOW (ref 150–400)
RBC: 3.22 MIL/uL — ABNORMAL LOW (ref 4.22–5.81)
RDW: 16.2 % — ABNORMAL HIGH (ref 11.5–15.5)
WBC: 5.3 10*3/uL (ref 4.0–10.5)
nRBC: 0 % (ref 0.0–0.2)

## 2018-07-01 MED ORDER — HEPARIN SOD (PORK) LOCK FLUSH 100 UNIT/ML IV SOLN
500.0000 [IU] | Freq: Once | INTRAVENOUS | Status: AC
  Administered 2018-07-01: 500 [IU] via INTRAVENOUS

## 2018-07-01 MED ORDER — SODIUM CHLORIDE 0.9% FLUSH
10.0000 mL | Freq: Once | INTRAVENOUS | Status: AC
  Administered 2018-07-01: 10 mL via INTRAVENOUS
  Filled 2018-07-01: qty 10

## 2018-07-01 NOTE — Progress Notes (Signed)
Hematology/Oncology follow-up note [facility] [phone] [phone]   Patient Care Team: Katheren Shams as PCP - General [name], RN as Registered Nurse  REFERRING PROVIDER: Dr.Thomas CHIEF COMPLAINTS/REASON FOR VISIT:  Follow-up for treatment of rectal cancer, assessment of tolerability of chemotherapy.  HISTORY OF PRESENTING ILLNESS:  Jeffery Mccormick is a  53 y.o.  male with PMH listed below who was referred to me for evaluation of rectal cancer.   He was evaluated by surgeon for evaluation of chronic rectal bleeding, ongoing chronic hemorrhoids.  12/25/2017 colonoscopy : revealed anal mass, 5-6 cm from the anal verge.  - Diverticulosis in the sigmoid colon. Biopsied. - A few 1 mm, non-bleeding polyps in the sigmoid colon, removed with a cold biopsy forceps. Resected and retrieved. Biopsied.  Biopsy showed  A. COLON POLYP, SIGMOID; COLD BIOPSY: - HYPERPLASTIC POLYP.  - NEGATIVE FOR DYSPLASIA AND MALIGNANCY.   B. RECTUM POLYP; COLD BIOPSY: - HYPERPLASTIC POLYP.  - NEGATIVE FOR DYSPLASIA AND MALIGNANCY.   C. RECTAL MASS; HOT SNARE:  - INVASIVE ADENOCARCINOMA, MODERATELY DIFFERENTIATED, ULCERATED.   # CT chest abdomen pelvis 01/16/2018 showed Wall thickening involving the lower rectum, corresponding to the patient's known rectal cancer. No findings specific for metastatic disease.  Scattered small bilateral pulmonary nodules measuring up to 3 mm, nonspecific but not considered overly suspicious for metastatic disease.   INTERVAL HISTORY Jeffery Mccormick is a 53 y.o. male who has above history reviewed by me today presents for assessment for tolerabily of Xeloda for neoadjuvant chemotherapy for rectal cancer cT3 N0 M0. Patient is on Radiation.  He reports intermittent diarrhea, occasionally blood in stool, spontaneously resolved. He has talked to Dr.Chrystal.   Also reports " low water pressure". Weak stream when  he urinates, and sometimes dribbling. Sometime he feels like "sitting on an egg".  Denies dysuria, increased frequency or urgency.  Denies pain.  Numbness and tingling of finger tips are stable. Not worse.   Review of Systems  Constitutional: Negative for chills, fever, malaise/fatigue and weight loss.  HENT: Negative for nosebleeds and sore throat.   Eyes: Negative for double vision, photophobia and redness.  Respiratory: Negative for cough, shortness of breath and wheezing.   Cardiovascular: Negative for chest pain, palpitations, orthopnea and leg swelling.  Gastrointestinal: Positive for diarrhea. Negative for abdominal pain, blood in stool, nausea and vomiting.  Genitourinary: Negative for dysuria.       Weak urine stream.  Musculoskeletal: Negative for back pain, myalgias and neck pain.  Skin: Negative for itching and rash.  Neurological: Positive for tingling. Negative for dizziness and tremors.  Endo/Heme/Allergies: Negative for environmental allergies. Does not bruise/bleed easily.  Psychiatric/Behavioral: Negative for depression and hallucinations.    MEDICAL HISTORY:  Past Medical History:  Diagnosis Date  . Hemorrhoid prolapse     SURGICAL HISTORY: Past Surgical History:  Procedure Laterality Date  . COLONOSCOPY WITH PROPOFOL N/A 12/25/2017   Procedure: COLONOSCOPY WITH PROPOFOL;  Surgeon: Benjamine Sprague, DO;  Location: [facility];  Service: General;  Laterality: N/A;  . HERNIA REPAIR  07/2013   Right Inguinal-Dr. Burt Knack  . PORTACATH PLACEMENT Right 02/03/2018   Procedure: INSERTION PORT-A-CATH;  Surgeon: Benjamine Sprague, DO;  Location: [facility];  Service: General;  Laterality: Right;    SOCIAL HISTORY: Social History   Socioeconomic History  . Marital status: Married    Spouse name: Not on file  . Number of children: Not on file  . Years of education: Not on file  .  Highest education level: Not on file  Occupational History  . Occupation: unemployed  Social  Needs  . Financial resource strain: Not on file  . Food insecurity:    Worry: Not on file    Inability: Not on file  . Transportation needs:    Medical: Not on file    Non-medical: Not on file  Tobacco Use  . Smoking status: Current Every Day Smoker    Packs/day: 1.00    Years: 30.00    Pack years: 30.00    Types: Cigarettes  . Smokeless tobacco: Never Used  Substance and Sexual Activity  . Alcohol use: Yes    Frequency: Never    Comment: occasionally  . Drug use: No  . Sexual activity: Not on file  Lifestyle  . Physical activity:    Days per week: Not on file    Minutes per session: Not on file  . Stress: Not on file  Relationships  . Social connections:    Talks on phone: Not on file    Gets together: Not on file    Attends religious service: Not on file    Active member of club or organization: Not on file    Attends meetings of clubs or organizations: Not on file    Relationship status: Not on file  . Intimate partner violence:    Fear of current or ex partner: Not on file    Emotionally abused: Not on file    Physically abused: Not on file    Forced sexual activity: Not on file  Other Topics Concern  . Not on file  Social History Narrative  . Not on file    FAMILY HISTORY: Family History  Family history unknown: Yes  patient reports that he does not know any family history.   ALLERGIES:  has No Known Allergies.  MEDICATIONS:  Current Outpatient Medications  Medication Sig Dispense Refill  . clotrimazole-betamethasone (LOTRISONE) cream Apply 1 application topically 2 (two) times daily. 30 g 0  . HYDROcodone-acetaminophen (NORCO) 5-325 MG tablet Take 1 tablet by mouth every 6 (six) hours as needed for up to 5 doses for moderate pain. 5 tablet 0  . ibuprofen (ADVIL,MOTRIN) 800 MG tablet Take 1 tablet (800 mg total) by mouth every 8 (eight) hours as needed for mild pain or moderate pain. 30 tablet 0  . lidocaine-prilocaine (EMLA) cream Apply to affected area  once 30 g 3  . loperamide (IMODIUM) 2 MG capsule Take 1 capsule (2 mg total) by mouth See admin instructions. With onset of loose stool, take 4mg  followed by 2mg  every 2 hours until 12 hours have passed without loose bowel movement. Maximum: 16 mg/day 120 capsule 1  . nystatin (MYCOSTATIN) 100000 UNIT/ML suspension Take 5 mLs (500,000 Units total) by mouth 3 (three) times daily. Swish and spit. 60 mL 0  . nystatin (MYCOSTATIN/NYSTOP) powder Apply topically 4 (four) times daily. 15 g 0  . ondansetron (ZOFRAN) 8 MG tablet Take 1 tablet (8 mg total) by mouth 2 (two) times daily as needed for refractory nausea / vomiting. Start on day 3 after chemotherapy. 30 tablet 1  . polyethylene glycol (MIRALAX / GLYCOLAX) packet Take 17 g by mouth daily as needed for moderate constipation.     . prochlorperazine (COMPAZINE) 10 MG tablet Take 1 tablet (10 mg total) by mouth every 6 (six) hours as needed (Nausea or vomiting). 30 tablet 1  . XELODA 500 MG tablet TAKE 3 TABLETS (1,500 MG TOTAL) BY MOUTH 2 (  TWO) TIMES DAILY AFTER A MEAL. TAKE ONLY ON DAYS OF RADIATION MONDAY THROUGH FRIDAY. 168 tablet 0   No current facility-administered medications for this visit.    RADIOGRAPHIC STUDIES: I have personally reviewed the radiological images as listed and agreed with the findings in the report. 01/23/2018 MRI pelvis with and without contrast Low rectal/anorectal adenocarcinoma T stage:  Early T3 Rectal adenocarcinoma N stage:  N0 Distance from tumor to the anal sphincter is minimal. The tumor extends to or just above the insertion of the external sphincter.  PHYSICAL EXAMINATION: ECOG PERFORMANCE STATUS: 1 - Symptomatic but completely ambulatory Vitals:   07/01/18 1028  BP: 130/78  Pulse: 85  Resp: 18  Temp: (!) 96.9 F (36.1 C)   Filed Weights   07/01/18 1028  Weight: 187 lb 6.4 oz (85 kg)    Physical Exam Constitutional:      General: He is not in acute distress. HENT:     Head: Normocephalic and  atraumatic.  Eyes:     General: No scleral icterus.    Pupils: Pupils are equal, round, and reactive to light.  Neck:     Musculoskeletal: Normal range of motion and neck supple.  Cardiovascular:     Rate and Rhythm: Normal rate and regular rhythm.     Heart sounds: Normal heart sounds.  Pulmonary:     Effort: Pulmonary effort is normal. No respiratory distress.     Breath sounds: No wheezing.  Abdominal:     General: Bowel sounds are normal. There is no distension.     Palpations: Abdomen is soft. There is no mass.     Tenderness: There is no abdominal tenderness.  Musculoskeletal: Normal range of motion.        General: No deformity.  Skin:    General: Skin is warm and dry.     Findings: No erythema or rash.  Neurological:     Mental Status: He is alert and oriented to person, place, and time.     Cranial Nerves: No cranial nerve deficit.     Coordination: Coordination normal.  Psychiatric:        Behavior: Behavior normal.        Thought Content: Thought content normal.      LABORATORY DATA:  I have reviewed the data as listed Lab Results  Component Value Date   WBC 5.3 07/01/2018   HGB 12.0 (L) 07/01/2018   HCT 34.7 (L) 07/01/2018   MCV 107.8 (H) 07/01/2018   PLT 146 (L) 07/01/2018   Recent Labs    05/04/18 0818 05/18/18 0845 07/01/18 1012  NA 139 137 137  K 4.3 4.2 3.8  CL 109 108 106  CO2 23 25 24   GLUCOSE 107* 89 108*  BUN 11 13 10   CREATININE 0.89 0.78 0.83  CALCIUM 9.0 9.1 8.9  GFRNONAA >60 >60 >60  GFRAA >60 >60 >60  PROT 7.2 7.7 7.2  ALBUMIN 3.8 4.0 3.8  AST 30 27 21   ALT 35 32 19  ALKPHOS 90 98 89  BILITOT 0.4 0.5 0.5   Iron/TIBC/Ferritin/ %Sat No results found for: IRON, TIBC, FERRITIN, IRONPCTSAT   01/29/2018 baseline CEA 11.6   ASSESSMENT & PLAN:  1. Rectal cancer ([place])   2. Port-A-Cath in place   3. Encounter for antineoplastic chemotherapy   4. Macrocytic anemia   Cancer Staging Rectal cancer [facility]) Staging form: Colon and  Rectum, AJCC 8th Edition - Clinical stage from 01/29/2018: Stage IIA (cT3, cN0, cM0) - Signed by  Earlie Server, MD on 02/01/2018  #Low rectal cancer, plan total neoadjuvant therapy.chemotherapy regimen with FOLFOX Q2w x 4 months, followed by concurrent Xeloda with RT,  Tolerating Xeloda with concurrent RT.  Labs are reviewed and discussed with patient.  with Xeloda 825mg  mg/m2 BID on the days with radiation on radiation days.   # Weak urine stream, without any dysuria.increased frequency or urgency.  Recommend patient to increase oral hydration.  # Grade 1 Neuropathy, stable.   # Macrocytic anemia, MCV 107. Will check folate and B12 # Port needs to be flushed every 6 weeks. Will schedule   Return of visit: 2 weeks.   Earlie Server, MD, PhD Hematology Oncology [facility] Pager- [phone] 07/01/2018

## 2018-07-01 NOTE — Progress Notes (Signed)
Patient here for follow up. States he has diarrhea that "comes and goes" from radiation. No other concerns. Patient has had 6 pound weight gain, states he has been eating very well.

## 2018-07-02 ENCOUNTER — Ambulatory Visit
Admission: RE | Admit: 2018-07-02 | Discharge: 2018-07-02 | Disposition: A | Discharge status: Home / Self Care | Payer: Medicaid Other | Source: Ambulatory Visit | Attending: Radiation Oncology | Admitting: Radiation Oncology

## 2018-07-02 ENCOUNTER — Other Ambulatory Visit: Payer: Self-pay

## 2018-07-02 DIAGNOSIS — Z510 Encounter for antineoplastic radiation therapy: Secondary | ICD-10-CM | POA: Diagnosis not present

## 2018-07-02 LAB — CEA: CEA: 3.6 ng/mL (ref 0.0–4.7)

## 2018-07-03 ENCOUNTER — Ambulatory Visit
Admission: RE | Admit: 2018-07-03 | Discharge: 2018-07-03 | Disposition: A | Discharge status: Home / Self Care | Payer: Medicaid Other | Source: Ambulatory Visit | Attending: Radiation Oncology | Admitting: Radiation Oncology

## 2018-07-03 ENCOUNTER — Other Ambulatory Visit: Payer: Self-pay

## 2018-07-03 DIAGNOSIS — Z510 Encounter for antineoplastic radiation therapy: Secondary | ICD-10-CM | POA: Diagnosis not present

## 2018-07-06 ENCOUNTER — Ambulatory Visit
Admission: RE | Admit: 2018-07-06 | Discharge: 2018-07-06 | Disposition: A | Discharge status: Home / Self Care | Payer: Medicaid Other | Source: Ambulatory Visit | Attending: Radiation Oncology | Admitting: Radiation Oncology

## 2018-07-06 ENCOUNTER — Other Ambulatory Visit: Payer: Self-pay

## 2018-07-06 ENCOUNTER — Inpatient Hospital Stay: Payer: Medicaid Other

## 2018-07-06 DIAGNOSIS — C20 Malignant neoplasm of rectum: Secondary | ICD-10-CM | POA: Diagnosis not present

## 2018-07-06 DIAGNOSIS — Z510 Encounter for antineoplastic radiation therapy: Secondary | ICD-10-CM | POA: Diagnosis not present

## 2018-07-06 LAB — CBC
HEMATOCRIT: 36.9 % — AB (ref 39.0–52.0)
Hemoglobin: 12.9 g/dL — ABNORMAL LOW (ref 13.0–17.0)
MCH: 37.9 pg — ABNORMAL HIGH (ref 26.0–34.0)
MCHC: 35 g/dL (ref 30.0–36.0)
MCV: 108.5 fL — ABNORMAL HIGH (ref 80.0–100.0)
Platelets: 166 10*3/uL (ref 150–400)
RBC: 3.4 MIL/uL — ABNORMAL LOW (ref 4.22–5.81)
RDW: 16 % — ABNORMAL HIGH (ref 11.5–15.5)
WBC: 6.6 10*3/uL (ref 4.0–10.5)
nRBC: 0 % (ref 0.0–0.2)

## 2018-07-07 ENCOUNTER — Other Ambulatory Visit: Payer: Self-pay

## 2018-07-07 ENCOUNTER — Ambulatory Visit
Admission: RE | Admit: 2018-07-07 | Discharge: 2018-07-07 | Disposition: A | Discharge status: Home / Self Care | Payer: Medicaid Other | Source: Ambulatory Visit | Attending: Radiation Oncology | Admitting: Radiation Oncology

## 2018-07-07 ENCOUNTER — Ambulatory Visit: Admission: RE | Admit: 2018-07-07 | Payer: Medicaid Other | Source: Ambulatory Visit

## 2018-07-07 DIAGNOSIS — Z510 Encounter for antineoplastic radiation therapy: Secondary | ICD-10-CM | POA: Diagnosis not present

## 2018-07-08 ENCOUNTER — Ambulatory Visit
Admission: RE | Admit: 2018-07-08 | Discharge: 2018-07-08 | Disposition: A | Discharge status: Home / Self Care | Payer: Medicaid Other | Source: Ambulatory Visit | Attending: Radiation Oncology | Admitting: Radiation Oncology

## 2018-07-08 ENCOUNTER — Inpatient Hospital Stay: Payer: Medicaid Other

## 2018-07-08 ENCOUNTER — Other Ambulatory Visit: Payer: Self-pay

## 2018-07-08 DIAGNOSIS — D539 Nutritional anemia, unspecified: Secondary | ICD-10-CM

## 2018-07-08 DIAGNOSIS — C20 Malignant neoplasm of rectum: Secondary | ICD-10-CM | POA: Diagnosis not present

## 2018-07-08 DIAGNOSIS — Z95828 Presence of other vascular implants and grafts: Secondary | ICD-10-CM

## 2018-07-08 DIAGNOSIS — Z510 Encounter for antineoplastic radiation therapy: Secondary | ICD-10-CM | POA: Diagnosis not present

## 2018-07-08 DIAGNOSIS — Z5111 Encounter for antineoplastic chemotherapy: Secondary | ICD-10-CM

## 2018-07-08 LAB — COMPREHENSIVE METABOLIC PANEL
ALT: 17 U/L (ref 0–44)
AST: 19 U/L (ref 15–41)
Albumin: 3.9 g/dL (ref 3.5–5.0)
Alkaline Phosphatase: 104 U/L (ref 38–126)
Anion gap: 6 (ref 5–15)
BUN: 11 mg/dL (ref 6–20)
CO2: 24 mmol/L (ref 22–32)
Calcium: 8.8 mg/dL — ABNORMAL LOW (ref 8.9–10.3)
Chloride: 106 mmol/L (ref 98–111)
Creatinine, Ser: 0.81 mg/dL (ref 0.61–1.24)
GFR calc Af Amer: >60 mL/min (ref >60)
GFR calc non Af Amer: >60 mL/min (ref >60)
Glucose, Bld: 94 mg/dL (ref 70–99)
Potassium: 3.8 mmol/L (ref 3.5–5.1)
Sodium: 136 mmol/L (ref 135–145)
Total Bilirubin: 0.5 mg/dL (ref 0.3–1.2)
Total Protein: 7.3 g/dL (ref 6.5–8.1)

## 2018-07-08 LAB — CBC WITH DIFFERENTIAL/PLATELET
Abs Immature Granulocytes: 0.02 10*3/uL (ref 0.00–0.07)
Basophils Absolute: 0 10*3/uL (ref 0.0–0.1)
Basophils Relative: 0 %
Eosinophils Absolute: 0.1 10*3/uL (ref 0.0–0.5)
Eosinophils Relative: 2 %
HCT: 35.4 % — ABNORMAL LOW (ref 39.0–52.0)
Hemoglobin: 12.4 g/dL — ABNORMAL LOW (ref 13.0–17.0)
Immature Granulocytes: 0 %
Lymphocytes Relative: 15 %
Lymphs Abs: 0.8 10*3/uL (ref 0.7–4.0)
MCH: 38 pg — ABNORMAL HIGH (ref 26.0–34.0)
MCHC: 35 g/dL (ref 30.0–36.0)
MCV: 108.6 fL — ABNORMAL HIGH (ref 80.0–100.0)
Monocytes Absolute: 0.6 10*3/uL (ref 0.1–1.0)
Monocytes Relative: 12 %
Neutro Abs: 3.7 10*3/uL (ref 1.7–7.7)
Neutrophils Relative %: 71 %
Platelets: 156 10*3/uL (ref 150–400)
RBC: 3.26 MIL/uL — ABNORMAL LOW (ref 4.22–5.81)
RDW: 16.3 % — ABNORMAL HIGH (ref 11.5–15.5)
WBC: 5.3 10*3/uL (ref 4.0–10.5)
nRBC: 0 % (ref 0.0–0.2)

## 2018-07-08 LAB — FOLATE: Folate: 16.4 ng/mL (ref >5.9)

## 2018-07-08 LAB — VITAMIN B12: Vitamin B-12: 212 pg/mL (ref 180–914)

## 2018-07-09 ENCOUNTER — Ambulatory Visit
Admission: RE | Admit: 2018-07-09 | Discharge: 2018-07-09 | Disposition: A | Discharge status: Home / Self Care | Payer: Medicaid Other | Source: Ambulatory Visit | Attending: Radiation Oncology | Admitting: Radiation Oncology

## 2018-07-09 ENCOUNTER — Other Ambulatory Visit: Payer: Self-pay

## 2018-07-09 DIAGNOSIS — Z510 Encounter for antineoplastic radiation therapy: Secondary | ICD-10-CM | POA: Diagnosis not present

## 2018-07-10 ENCOUNTER — Ambulatory Visit
Admission: RE | Admit: 2018-07-10 | Discharge: 2018-07-10 | Disposition: A | Discharge status: Home / Self Care | Payer: Medicaid Other | Source: Ambulatory Visit | Attending: Radiation Oncology | Admitting: Radiation Oncology

## 2018-07-10 ENCOUNTER — Other Ambulatory Visit: Payer: Self-pay

## 2018-07-10 DIAGNOSIS — Z510 Encounter for antineoplastic radiation therapy: Secondary | ICD-10-CM | POA: Diagnosis not present

## 2018-07-13 ENCOUNTER — Telehealth: Payer: Self-pay

## 2018-07-13 NOTE — Telephone Encounter (Signed)
Jeffery Mccormick has completed radiation. They are requesting a different surgeon. In the past he has met Dr. Thomas at [facility]. Voicemail left to return call for further discussion regarding surgeon. 

## 2018-07-14 ENCOUNTER — Telehealth: Payer: Self-pay | Admitting: Oncology

## 2018-07-15 ENCOUNTER — Inpatient Hospital Stay: Payer: Medicaid Other | Admitting: Oncology

## 2018-07-15 ENCOUNTER — Other Ambulatory Visit: Payer: Self-pay

## 2018-07-15 ENCOUNTER — Encounter: Payer: Self-pay | Admitting: Oncology

## 2018-07-15 ENCOUNTER — Inpatient Hospital Stay: Payer: Medicaid Other

## 2018-07-15 DIAGNOSIS — C20 Malignant neoplasm of rectum: Secondary | ICD-10-CM

## 2018-07-15 DIAGNOSIS — Z510 Encounter for antineoplastic radiation therapy: Secondary | ICD-10-CM | POA: Diagnosis not present

## 2018-07-15 LAB — CBC WITH DIFFERENTIAL/PLATELET
Abs Immature Granulocytes: 0.02 10*3/uL (ref 0.00–0.07)
Basophils Absolute: 0 10*3/uL (ref 0.0–0.1)
Basophils Relative: 1 %
Eosinophils Absolute: 0.1 10*3/uL (ref 0.0–0.5)
Eosinophils Relative: 2 %
HCT: 35.3 % — ABNORMAL LOW (ref 39.0–52.0)
Hemoglobin: 12.4 g/dL — ABNORMAL LOW (ref 13.0–17.0)
Immature Granulocytes: 0 %
Lymphocytes Relative: 17 %
Lymphs Abs: 0.8 10*3/uL (ref 0.7–4.0)
MCH: 38.5 pg — ABNORMAL HIGH (ref 26.0–34.0)
MCHC: 35.1 g/dL (ref 30.0–36.0)
MCV: 109.6 fL — ABNORMAL HIGH (ref 80.0–100.0)
Monocytes Absolute: 0.8 10*3/uL (ref 0.1–1.0)
Monocytes Relative: 15 %
Neutro Abs: 3.3 10*3/uL (ref 1.7–7.7)
Neutrophils Relative %: 65 %
Platelets: 157 10*3/uL (ref 150–400)
RBC: 3.22 MIL/uL — ABNORMAL LOW (ref 4.22–5.81)
RDW: 16.5 % — ABNORMAL HIGH (ref 11.5–15.5)
WBC: 5 10*3/uL (ref 4.0–10.5)
nRBC: 0 % (ref 0.0–0.2)

## 2018-07-15 LAB — COMPREHENSIVE METABOLIC PANEL
ALT: 17 U/L (ref 0–44)
AST: 21 U/L (ref 15–41)
Albumin: 4 g/dL (ref 3.5–5.0)
Alkaline Phosphatase: 99 U/L (ref 38–126)
Anion gap: 5 (ref 5–15)
BUN: 11 mg/dL (ref 6–20)
CO2: 25 mmol/L (ref 22–32)
Calcium: 8.8 mg/dL — ABNORMAL LOW (ref 8.9–10.3)
Chloride: 109 mmol/L (ref 98–111)
Creatinine, Ser: 0.88 mg/dL (ref 0.61–1.24)
GFR calc Af Amer: >60 mL/min (ref >60)
GFR calc non Af Amer: >60 mL/min (ref >60)
Glucose, Bld: 103 mg/dL — ABNORMAL HIGH (ref 70–99)
Potassium: 4 mmol/L (ref 3.5–5.1)
Sodium: 139 mmol/L (ref 135–145)
Total Bilirubin: 0.5 mg/dL (ref 0.3–1.2)
Total Protein: 7 g/dL (ref 6.5–8.1)

## 2018-07-16 ENCOUNTER — Telehealth: Payer: Self-pay

## 2018-07-16 ENCOUNTER — Other Ambulatory Visit: Payer: Self-pay

## 2018-07-16 ENCOUNTER — Encounter: Payer: Self-pay | Admitting: Oncology

## 2018-07-16 ENCOUNTER — Inpatient Hospital Stay (HOSPITAL_BASED_OUTPATIENT_CLINIC_OR_DEPARTMENT_OTHER): Payer: Medicaid Other | Admitting: Oncology

## 2018-07-16 DIAGNOSIS — Z95828 Presence of other vascular implants and grafts: Secondary | ICD-10-CM

## 2018-07-16 DIAGNOSIS — C20 Malignant neoplasm of rectum: Secondary | ICD-10-CM

## 2018-07-16 DIAGNOSIS — G620 Drug-induced polyneuropathy: Secondary | ICD-10-CM

## 2018-07-16 DIAGNOSIS — T451X5A Adverse effect of antineoplastic and immunosuppressive drugs, initial encounter: Secondary | ICD-10-CM

## 2018-07-16 DIAGNOSIS — D539 Nutritional anemia, unspecified: Secondary | ICD-10-CM

## 2018-07-16 MED ORDER — GABAPENTIN 300 MG PO CAPS: 300.0000 mg | ORAL_CAPSULE | Freq: Two times a day (BID) | ORAL | 1 refills | Status: DC

## 2018-07-16 NOTE — Progress Notes (Signed)
Called pt for WebEx visit.  Patient states no new concerns

## 2018-07-16 NOTE — Telephone Encounter (Signed)
Called and spoke with Mr and Mrs Bruington. We reviewed all available surgeon options in the area. They have decided with [facility]. Referral sent to Dr. Rolla Etienne. I will call [facility] to arrange appoint.

## 2018-07-16 NOTE — Progress Notes (Signed)
HEMATOLOGY-ONCOLOGY TeleHEALTH VISIT PROGRESS NOTE  I connected with Jeffery Mccormick on 07/16/18 at  8:30 AM EDT by video enabled telemedicine visit and verified that I am speaking with the correct person using two identifiers. I discussed the limitations, risks, security and privacy concerns of performing an evaluation and management service by telemedicine and the availability of in-person appointments. I also discussed with the patient that there may be a patient responsible charge related to this service. The patient expressed understanding and agreed to proceed.   Other persons participating in the visit and their role in the encounter:  Geraldine Solar, CMA, check in patient    Patient's location: Home  Provider's location: work   Chief Complaint: follow up for management of rectal cancer.    INTERVAL HISTORY Jeffery Mccormick is a 53 y.o. male who has above history reviewed by me today presents for follow up visit for management of rectal cancer Problems and complaints are listed below:  Patient has finished concurrent xeloda and Radiation on 07/10/2018 . He tolerated treatment well.  Denies any new complaints today.  Denies skin changes, diarrhea, rash, sob, fever, chills, abdominal pain, or blood in the stool.  No nausea or vomiting.  Also reports persistent numbness and tingling of fingertips and feet. Appetite is good.   Review of Systems  Constitutional: Positive for fatigue. Negative for appetite change, chills, fever and unexpected weight change.  HENT:   Negative for hearing loss and voice change.   Eyes: Negative for eye problems and icterus.  Respiratory: Negative for chest tightness, cough and shortness of breath.   Cardiovascular: Negative for chest pain and leg swelling.  Gastrointestinal: Negative for abdominal distention and abdominal pain.  Endocrine: Negative for hot flashes.  Genitourinary: Negative for difficulty urinating, dysuria and frequency.   Musculoskeletal:  Negative for arthralgias.  Skin: Negative for itching and rash.  Neurological: Negative for light-headedness and numbness.  Hematological: Negative for adenopathy. Does not bruise/bleed easily.  Psychiatric/Behavioral: Negative for confusion.    Past Medical History:  Diagnosis Date  . Hemorrhoid prolapse    Past Surgical History:  Procedure Laterality Date  . COLONOSCOPY WITH PROPOFOL N/A 12/25/2017   Procedure: COLONOSCOPY WITH PROPOFOL;  Surgeon: Benjamine Sprague, DO;  Location: [facility];  Service: General;  Laterality: N/A;  . HERNIA REPAIR  07/2013   Right Inguinal-Dr. Burt Knack  . PORTACATH PLACEMENT Right 02/03/2018   Procedure: INSERTION PORT-A-CATH;  Surgeon: Benjamine Sprague, DO;  Location: [facility];  Service: General;  Laterality: Right;    Family History  Family history unknown: Yes    Social History   Socioeconomic History  . Marital status: Married    Spouse name: Not on file  . Number of children: Not on file  . Years of education: Not on file  . Highest education level: Not on file  Occupational History  . Occupation: unemployed  Social Needs  . Financial resource strain: Not on file  . Food insecurity:    Worry: Not on file    Inability: Not on file  . Transportation needs:    Medical: Not on file    Non-medical: Not on file  Tobacco Use  . Smoking status: Current Every Day Smoker    Packs/day: 1.00    Years: 30.00    Pack years: 30.00    Types: Cigarettes  . Smokeless tobacco: Never Used  Substance and Sexual Activity  . Alcohol use: Yes    Frequency: Never    Comment: occasionally  .  Drug use: No  . Sexual activity: Not on file  Lifestyle  . Physical activity:    Days per week: Not on file    Minutes per session: Not on file  . Stress: Not on file  Relationships  . Social connections:    Talks on phone: Not on file    Gets together: Not on file    Attends religious service: Not on file    Active member of club or organization: Not on file     Attends meetings of clubs or organizations: Not on file    Relationship status: Not on file  . Intimate partner violence:    Fear of current or ex partner: Not on file    Emotionally abused: Not on file    Physically abused: Not on file    Forced sexual activity: Not on file  Other Topics Concern  . Not on file  Social History Narrative  . Not on file    Current Outpatient Medications on File Prior to Visit  Medication Sig Dispense Refill  . clotrimazole-betamethasone (LOTRISONE) cream Apply 1 application topically 2 (two) times daily. 30 g 0  . lidocaine-prilocaine (EMLA) cream Apply to affected area once 30 g 3   No current facility-administered medications on file prior to visit.     No Known Allergies     Observations/Objective: Today's Vitals   07/16/18 0845  PainSc: 0-No pain   There is no height or weight on file to calculate BMI.  Physical Exam  Constitutional: He is oriented to person, place, and time and well-developed, well-nourished, and in no distress. No distress.  HENT:  Head: Normocephalic and atraumatic.  Eyes: EOM are normal.  Neck: Normal range of motion.  Neurological: He is alert and oriented to person, place, and time.  Psychiatric: Affect normal.    CBC    Component Value Date/Time   WBC 5.0 07/15/2018 0940   RBC 3.22 (L) 07/15/2018 0940   HGB 12.4 (L) 07/15/2018 0940   HGB 16.7 07/22/2013 0914   HCT 35.3 (L) 07/15/2018 0940   HCT 49.3 07/22/2013 0914   PLT 157 07/15/2018 0940   PLT 197 07/22/2013 0914   MCV 109.6 (H) 07/15/2018 0940   MCV 97 07/22/2013 0914   MCH 38.5 (H) 07/15/2018 0940   MCHC 35.1 07/15/2018 0940   RDW 16.5 (H) 07/15/2018 0940   RDW 13.7 07/22/2013 0914   LYMPHSABS 0.8 07/15/2018 0940   LYMPHSABS 2.6 07/22/2013 0914   MONOABS 0.8 07/15/2018 0940   MONOABS 0.6 07/22/2013 0914   EOSABS 0.1 07/15/2018 0940   EOSABS 0.1 07/22/2013 0914   BASOSABS 0.0 07/15/2018 0940   BASOSABS 0.0 07/22/2013 0914    CMP      Component Value Date/Time   NA 139 07/15/2018 0940   NA 136 07/22/2013 0914   K 4.0 07/15/2018 0940   K 4.2 07/22/2013 0914   CL 109 07/15/2018 0940   CL 105 07/22/2013 0914   CO2 25 07/15/2018 0940   CO2 28 07/22/2013 0914   GLUCOSE 103 (H) 07/15/2018 0940   GLUCOSE 101 (H) 07/22/2013 0914   BUN 11 07/15/2018 0940   BUN 6 (L) 07/22/2013 0914   CREATININE 0.88 07/15/2018 0940   CREATININE 0.75 07/22/2013 0914   CALCIUM 8.8 (L) 07/15/2018 0940   CALCIUM 9.2 07/22/2013 0914   PROT 7.0 07/15/2018 0940   ALBUMIN 4.0 07/15/2018 0940   AST 21 07/15/2018 0940   ALT 17 07/15/2018 0940   ALKPHOS 99  07/15/2018 0940   BILITOT 0.5 07/15/2018 0940   GFRNONAA >60 07/15/2018 0940   GFRNONAA >60 07/22/2013 0914   GFRAA >60 07/15/2018 0940   GFRAA >60 07/22/2013 0914     Assessment and Plan: 1. Rectal cancer ([place])   2. Port-A-Cath in place   3. Macrocytic anemia   4. Neuropathy due to chemotherapeutic drug [facility])    Rectal cancer, patient has finished total neoadjuvant therapy with 8 cycles of FOLFOX, concurrent Xeloda and radiation. He tolerates treatments well. Labs reviewed and discussed with patient. He has mild macrocytic anemia, most likely secondary to Xeloda use.  Vitamin B12 and folate levels were normal. I discussed with patient about next phase of treatment.  He needs to follow-up with [facility] for evaluation of surgical resection.  He may need additional image prior to the procedure.  I will defer [facility] surgery.   #Chemotherapy-induced neuropathy, grade 2.  Advised patient to start gabapentin 300 mg twice daily.  Rationale and side effects of gabapentin discussed with patient.  Prescription sent to pharmacy.  #Mediport, recommend patient to have port flush every 6 weeks.   Follow Up Instructions: Follow-up to be determined.  Patient needs surveillance after he finishes surgical resection. Port flush Q6 weeks.   I discussed the assessment and treatment plan with  the patient. The patient was provided an opportunity to ask questions and all were answered. The patient agreed with the plan and demonstrated an understanding of the instructions.  The patient was advised to call back or seek an in-person evaluation if the symptoms worsen or if the condition fails to improve as anticipated.   I provided 15 minutes of face-to-face video visit time during this encounter, and > 50% was spent counseling as documented under my assessment & plan.  Earlie Server, MD 07/16/2018 10:43 PM

## 2018-07-17 ENCOUNTER — Telehealth: Payer: Self-pay

## 2018-07-17 NOTE — Telephone Encounter (Signed)
Called and spoke with Izora Gala at [facility]. They are currently working on referral and will have him scheduled soon.

## 2018-08-08 ENCOUNTER — Other Ambulatory Visit: Payer: Self-pay | Admitting: Oncology

## 2018-08-10 ENCOUNTER — Encounter: Payer: Self-pay | Admitting: Radiation Oncology

## 2018-08-10 ENCOUNTER — Encounter (INDEPENDENT_AMBULATORY_CARE_PROVIDER_SITE_OTHER): Payer: Self-pay

## 2018-08-10 ENCOUNTER — Ambulatory Visit
Admission: RE | Admit: 2018-08-10 | Discharge: 2018-08-10 | Disposition: A | Discharge status: Home / Self Care | Payer: Self-pay | Source: Ambulatory Visit | Attending: Radiation Oncology | Admitting: Radiation Oncology

## 2018-08-10 ENCOUNTER — Other Ambulatory Visit: Payer: Self-pay

## 2018-08-10 VITALS — BP 166/89 | HR 76 | Temp 97.0°F | Resp 18 | Wt 186.0 lb

## 2018-08-10 DIAGNOSIS — C20 Malignant neoplasm of rectum: Secondary | ICD-10-CM | POA: Insufficient documentation

## 2018-08-10 DIAGNOSIS — Z923 Personal history of irradiation: Secondary | ICD-10-CM | POA: Insufficient documentation

## 2018-08-10 DIAGNOSIS — R197 Diarrhea, unspecified: Secondary | ICD-10-CM | POA: Insufficient documentation

## 2018-08-10 DIAGNOSIS — Z9221 Personal history of antineoplastic chemotherapy: Secondary | ICD-10-CM | POA: Insufficient documentation

## 2018-08-10 NOTE — Progress Notes (Signed)
Radiation Oncology Follow up Note  Name: Jeffery Mccormick   Date:   08/10/2018 MRN:  [id number] DOB: [birth year]    This 53 y.o. 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  REFERRING PROVIDER: Katheren Mccormick  HPI: Patient is a 53 year old male now out for 1 month having completed concurrent chemoradiation in a neoadjuvant fashion status post FOLFOX chemotherapy for stage II a distal rectal adenocarcinoma seen today in routine follow-up he is doing well he states his lower urinary tract symptoms have improved still has occasional diarrhea does not take Imodium.  He is met with [facility] surgeons [facility] they are planning for a reevaluation in several weeks and will make final treatment plan is for surgical intervention at that time..  COMPLICATIONS OF TREATMENT: none  FOLLOW UP COMPLIANCE: keeps appointments   PHYSICAL EXAM:  BP (!) 166/89 (BP Location: Left Arm, Patient Position: Sitting)   Pulse 76   Temp (!) 97 F (36.1 C) (Tympanic)   Resp 18   Wt 185 lb 15.3 oz (84.4 kg)   BMI 26.68 kg/m  Well-developed well-nourished patient in NAD. HEENT reveals PERLA, EOMI, discs not visualized.  Oral cavity is clear. No oral mucosal lesions are identified. Neck is clear without evidence of cervical or supraclavicular adenopathy. Lungs are clear to A&P. Cardiac examination is essentially unremarkable with regular rate and rhythm without murmur rub or thrill. Abdomen is benign with no organomegaly or masses noted. Motor sensory and DTR levels are equal and symmetric in the upper and lower extremities. Cranial nerves II through XII are grossly intact. Proprioception is intact. No peripheral adenopathy or edema is identified. No motor or sensory levels are noted. Crude visual fields are within normal range.  RADIOLOGY RESULTS: No current films for review  PLAN: I have read notes from [facility] and  they are planning reevaluation with physical examination possible other imaging for determination of appropriate surgical intervention.  I have asked to see him back in 3 months.  Have alerted him he needs to follow-up with Dr. you after his surgery for possible further chemotherapy depending on the surgical pathology results of his surgery.  Patient comprehends my recommendations well.  I have set up a follow-up appointment in 3 months.  Patient knows to call with any concerns at any time.  I would like to take this opportunity to thank you for allowing me to participate in the care of your patient.Jeffery Filbert, MD

## 2018-08-26 ENCOUNTER — Telehealth: Payer: Self-pay

## 2018-08-26 NOTE — Telephone Encounter (Signed)
Voicemail left to return call for update on how Mr. Jeffery Mccormick is doing. Awaiting follow up with Dr. Cecil Cobbs for June.

## 2018-08-26 NOTE — Telephone Encounter (Signed)
Received call back from, [place]. She reports they are well and Jeffery Mccormick's surgery is scheduled for 09/23/18

## 2018-08-27 ENCOUNTER — Telehealth: Payer: Self-pay | Admitting: *Deleted

## 2018-08-27 NOTE — Telephone Encounter (Signed)
Port Flush per [facility] 08/27/18 scheduling message. Appt was scheduled as requested. A message was left on patient's  vmail making him aware of the scheduled date and time.  Appt letter was mailed out as well.

## 2018-09-01 ENCOUNTER — Other Ambulatory Visit: Payer: Self-pay

## 2018-09-02 ENCOUNTER — Other Ambulatory Visit: Payer: Self-pay

## 2018-09-02 ENCOUNTER — Inpatient Hospital Stay: Payer: Medicaid Other | Attending: Radiation Oncology

## 2018-09-02 DIAGNOSIS — C20 Malignant neoplasm of rectum: Secondary | ICD-10-CM | POA: Diagnosis present

## 2018-09-02 DIAGNOSIS — Z452 Encounter for adjustment and management of vascular access device: Secondary | ICD-10-CM | POA: Diagnosis not present

## 2018-09-02 DIAGNOSIS — Z95828 Presence of other vascular implants and grafts: Secondary | ICD-10-CM

## 2018-09-02 MED ORDER — SODIUM CHLORIDE 0.9% FLUSH
10.0000 mL | Freq: Once | INTRAVENOUS | Status: AC
  Administered 2018-09-02: 14:00:00 10 mL via INTRAVENOUS
  Filled 2018-09-02: qty 10

## 2018-09-02 MED ORDER — HEPARIN SOD (PORK) LOCK FLUSH 100 UNIT/ML IV SOLN
500.0000 [IU] | Freq: Once | INTRAVENOUS | Status: AC
  Administered 2018-09-02: 14:00:00 500 [IU] via INTRAVENOUS

## 2018-10-13 ENCOUNTER — Encounter: Payer: Self-pay | Admitting: Oncology

## 2018-10-13 ENCOUNTER — Inpatient Hospital Stay: Payer: Medicaid Other | Attending: Oncology

## 2018-10-13 ENCOUNTER — Inpatient Hospital Stay (HOSPITAL_BASED_OUTPATIENT_CLINIC_OR_DEPARTMENT_OTHER): Payer: Medicaid Other | Admitting: Oncology

## 2018-10-13 ENCOUNTER — Other Ambulatory Visit: Payer: Self-pay

## 2018-10-13 VITALS — BP 142/89 | HR 84 | Temp 96.9°F | Resp 18 | Wt 193.0 lb

## 2018-10-13 DIAGNOSIS — Z95828 Presence of other vascular implants and grafts: Secondary | ICD-10-CM

## 2018-10-13 DIAGNOSIS — D539 Nutritional anemia, unspecified: Secondary | ICD-10-CM | POA: Diagnosis not present

## 2018-10-13 DIAGNOSIS — C20 Malignant neoplasm of rectum: Secondary | ICD-10-CM

## 2018-10-13 DIAGNOSIS — R918 Other nonspecific abnormal finding of lung field: Secondary | ICD-10-CM | POA: Insufficient documentation

## 2018-10-13 DIAGNOSIS — T451X5A Adverse effect of antineoplastic and immunosuppressive drugs, initial encounter: Secondary | ICD-10-CM | POA: Diagnosis not present

## 2018-10-13 DIAGNOSIS — G620 Drug-induced polyneuropathy: Secondary | ICD-10-CM | POA: Diagnosis not present

## 2018-10-13 LAB — COMPREHENSIVE METABOLIC PANEL
ALT: 23 U/L (ref 0–44)
AST: 21 U/L (ref 15–41)
Albumin: 3.9 g/dL (ref 3.5–5.0)
Alkaline Phosphatase: 116 U/L (ref 38–126)
Anion gap: 8 (ref 5–15)
BUN: 10 mg/dL (ref 6–20)
CO2: 25 mmol/L (ref 22–32)
Calcium: 8.9 mg/dL (ref 8.9–10.3)
Chloride: 105 mmol/L (ref 98–111)
Creatinine, Ser: 0.78 mg/dL (ref 0.61–1.24)
GFR calc Af Amer: >60 mL/min (ref >60)
GFR calc non Af Amer: >60 mL/min (ref >60)
Glucose, Bld: 113 mg/dL — ABNORMAL HIGH (ref 70–99)
Potassium: 3.8 mmol/L (ref 3.5–5.1)
Sodium: 138 mmol/L (ref 135–145)
Total Bilirubin: 0.4 mg/dL (ref 0.3–1.2)
Total Protein: 7.3 g/dL (ref 6.5–8.1)

## 2018-10-13 LAB — CBC WITH DIFFERENTIAL/PLATELET
Abs Immature Granulocytes: 0.02 10*3/uL (ref 0.00–0.07)
Basophils Absolute: 0.1 10*3/uL (ref 0.0–0.1)
Basophils Relative: 1 %
Eosinophils Absolute: 0.1 10*3/uL (ref 0.0–0.5)
Eosinophils Relative: 2 %
HCT: 41.7 % (ref 39.0–52.0)
Hemoglobin: 14.5 g/dL (ref 13.0–17.0)
Immature Granulocytes: 0 %
Lymphocytes Relative: 15 %
Lymphs Abs: 1.2 10*3/uL (ref 0.7–4.0)
MCH: 35.5 pg — ABNORMAL HIGH (ref 26.0–34.0)
MCHC: 34.8 g/dL (ref 30.0–36.0)
MCV: 102 fL — ABNORMAL HIGH (ref 80.0–100.0)
Monocytes Absolute: 0.9 10*3/uL (ref 0.1–1.0)
Monocytes Relative: 11 %
Neutro Abs: 5.8 10*3/uL (ref 1.7–7.7)
Neutrophils Relative %: 71 %
Platelets: 232 10*3/uL (ref 150–400)
RBC: 4.09 MIL/uL — ABNORMAL LOW (ref 4.22–5.81)
RDW: 12.6 % (ref 11.5–15.5)
WBC: 8.1 10*3/uL (ref 4.0–10.5)
nRBC: 0 % (ref 0.0–0.2)

## 2018-10-13 MED ORDER — SODIUM CHLORIDE 0.9% FLUSH
10.0000 mL | Freq: Once | INTRAVENOUS | Status: AC
  Administered 2018-10-13: 10 mL via INTRAVENOUS
  Filled 2018-10-13: qty 10

## 2018-10-13 MED ORDER — HEPARIN SOD (PORK) LOCK FLUSH 100 UNIT/ML IV SOLN
500.0000 [IU] | Freq: Once | INTRAVENOUS | Status: AC
  Administered 2018-10-13: 500 [IU] via INTRAVENOUS

## 2018-10-13 NOTE — Progress Notes (Signed)
Patient here for follow up. Had rectal surgery about 2-3 weeks ago. Pt voices no concerns.

## 2018-10-14 NOTE — Progress Notes (Signed)
Hematology/Oncology follow-up note [facility] Telephone:([phone] [phone]   Patient Care Team: Medicine, [facility] as PCP - General [name], RN as Registered Nurse  REFERRING PROVIDER: Dr.Thomas CHIEF COMPLAINTS/REASON FOR VISIT:  Follow-up for treatment of rectal cancer, assessment of tolerability of chemotherapy.  HISTORY OF PRESENTING ILLNESS:  Jeffery Mccormick is a  53 y.o.  male with PMH listed below who was referred to me for evaluation of rectal cancer.  Rectal cancer cT3 N0 M0. 12/25/2017 colonoscopy : revealed anal mass, 5-6 cm from the anal verge.  # CT chest abdomen pelvis 01/16/2018 showed Wall thickening involving the lower rectum, corresponding to the patient's known rectal cancer. No findings specific for metastatic disease. Scattered small bilateral pulmonary nodules measuring up to 3 mm, nonspecific but not considered overly suspicious for metastatic disease.  #01/23/2018 MRI pelvis with and without contrast T3N0  #Cancer treatment TNT neoadjuvant protocol #02/09/2018 to 05/18/2018 FOLFOX q. 14 days x 8 #07/10/2018 finished concurrent chemo with Xeloda/radiation   INTERVAL HISTORY Jeffery Mccormick is a 53 y.o. male who has above history reviewed by me today presents for assessment for tolerabily of Xeloda for neoadjuvant chemotherapy for rectal cancer cT3 N0 M0. Patient was referred to [facility] and was seen and evaluated by Dr. Cecil Cobbs. Medical records from [facility] was reviewed.  He had a discussion with [facility] surgery regarding local excision versus more radical surgery.  Patient opted to proceed with local excision.  Pathology showed tiny focus of invasion.  ypT1.  Margin was clear.  Patient decides to proceed with close surveillance instead of APR. He has an appointment with surgery in 3 months.  Plan MRI and EUA in 6 months. He has ongoing numbness and tingling of fingertips and toes.  Exacerbated by cold temperatures.   No alleviating factors.  Patient was previously prescribed on gabapentin.  Patient is not currently taking.  Not interested to take.  Review of Systems  Constitutional: Negative for chills, fever, malaise/fatigue and weight loss.  HENT: Negative for nosebleeds and sore throat.   Eyes: Negative for double vision, photophobia and redness.  Respiratory: Negative for cough, shortness of breath and wheezing.   Cardiovascular: Negative for chest pain, palpitations, orthopnea and leg swelling.  Gastrointestinal: Negative for abdominal pain, blood in stool, diarrhea, nausea and vomiting.  Genitourinary: Negative for dysuria.  Musculoskeletal: Negative for back pain, myalgias and neck pain.  Skin: Negative for itching and rash.  Neurological: Positive for tingling. Negative for dizziness and tremors.  Endo/Heme/Allergies: Negative for environmental allergies. Does not bruise/bleed easily.  Psychiatric/Behavioral: Negative for depression and hallucinations.    MEDICAL HISTORY:  Past Medical History:  Diagnosis Date  . Hemorrhoid prolapse     SURGICAL HISTORY: Past Surgical History:  Procedure Laterality Date  . COLONOSCOPY WITH PROPOFOL N/A 12/25/2017   Procedure: COLONOSCOPY WITH PROPOFOL;  Surgeon: Benjamine Sprague, DO;  Location: [facility];  Service: General;  Laterality: N/A;  . HERNIA REPAIR  07/2013   Right Inguinal-Dr. Burt Knack  . PORTACATH PLACEMENT Right 02/03/2018   Procedure: INSERTION PORT-A-CATH;  Surgeon: Benjamine Sprague, DO;  Location: [facility];  Service: General;  Laterality: Right;    SOCIAL HISTORY: Social History   Socioeconomic History  . Marital status: Married    Spouse name: Not on file  . Number of children: Not on file  . Years of education: Not on file  . Highest education level: Not on file  Occupational History  . Occupation: unemployed  Social Needs  .  Financial resource strain: Not on file  . Food insecurity    Worry: Not on file    Inability: Not on  file  . Transportation needs    Medical: Not on file    Non-medical: Not on file  Tobacco Use  . Smoking status: Current Every Day Smoker    Packs/day: 1.00    Years: 30.00    Pack years: 30.00    Types: Cigarettes  . Smokeless tobacco: Never Used  Substance and Sexual Activity  . Alcohol use: Yes    Frequency: Never    Comment: occasionally  . Drug use: No  . Sexual activity: Not on file  Lifestyle  . Physical activity    Days per week: Not on file    Minutes per session: Not on file  . Stress: Not on file  Relationships  . Social Herbalist on phone: Not on file    Gets together: Not on file    Attends religious service: Not on file    Active member of club or organization: Not on file    Attends meetings of clubs or organizations: Not on file    Relationship status: Not on file  . Intimate partner violence    Fear of current or ex partner: Not on file    Emotionally abused: Not on file    Physically abused: Not on file    Forced sexual activity: Not on file  Other Topics Concern  . Not on file  Social History Narrative  . Not on file    FAMILY HISTORY: Family History  Family history unknown: Yes  patient reports that he does not know any family history.   ALLERGIES:  has No Known Allergies.  MEDICATIONS:  Current Outpatient Medications  Medication Sig Dispense Refill  . clotrimazole-betamethasone (LOTRISONE) cream Apply 1 application topically 2 (two) times daily. (Patient not taking: Reported on 08/10/2018) 30 g 0  . gabapentin (NEURONTIN) 300 MG capsule TAKE 1 CAPSULE BY MOUTH TWICE A DAY (Patient not taking: Reported on 08/10/2018) 180 capsule 1  . lidocaine-prilocaine (EMLA) cream Apply to affected area once 30 g 3   No current facility-administered medications for this visit.    RADIOGRAPHIC STUDIES: I have personally reviewed the radiological images as listed and agreed with the findings in the report. 01/23/2018 MRI pelvis with and without  contrast Low rectal/anorectal adenocarcinoma T stage:  Early T3 Rectal adenocarcinoma N stage:  N0 Distance from tumor to the anal sphincter is minimal. The tumor extends to or just above the insertion of the external sphincter.  PHYSICAL EXAMINATION: ECOG PERFORMANCE STATUS: 1 - Symptomatic but completely ambulatory Vitals:   10/13/18 1401  BP: (!) 142/89  Pulse: 84  Resp: 18  Temp: (!) 96.9 F (36.1 C)   Filed Weights   10/13/18 1401  Weight: 193 lb (87.5 kg)    Physical Exam Constitutional:      General: He is not in acute distress. HENT:     Head: Normocephalic and atraumatic.  Eyes:     General: No scleral icterus.    Pupils: Pupils are equal, round, and reactive to light.  Neck:     Musculoskeletal: Normal range of motion and neck supple.  Cardiovascular:     Rate and Rhythm: Normal rate and regular rhythm.     Heart sounds: Normal heart sounds.  Pulmonary:     Effort: Pulmonary effort is normal. No respiratory distress.     Breath sounds: No wheezing.  Abdominal:     General: Bowel sounds are normal. There is no distension.     Palpations: Abdomen is soft. There is no mass.     Tenderness: There is no abdominal tenderness.  Musculoskeletal: Normal range of motion.        General: No deformity.  Skin:    General: Skin is warm and dry.     Findings: No erythema or rash.  Neurological:     Mental Status: He is alert and oriented to person, place, and time.     Cranial Nerves: No cranial nerve deficit.     Coordination: Coordination normal.  Psychiatric:        Behavior: Behavior normal.        Thought Content: Thought content normal.      LABORATORY DATA:  I have reviewed the data as listed Lab Results  Component Value Date   WBC 8.1 10/13/2018   HGB 14.5 10/13/2018   HCT 41.7 10/13/2018   MCV 102.0 (H) 10/13/2018   PLT 232 10/13/2018   Recent Labs    07/08/18 1043 07/15/18 0940 10/13/18 1323  NA 136 139 138  K 3.8 4.0 3.8  CL 106 109 105   CO2 24 25 25   GLUCOSE 94 103* 113*  BUN 11 11 10   CREATININE 0.81 0.88 0.78  CALCIUM 8.8* 8.8* 8.9  GFRNONAA >60 >60 >60  GFRAA >60 >60 >60  PROT 7.3 7.0 7.3  ALBUMIN 3.9 4.0 3.9  AST 19 21 21   ALT 17 17 23   ALKPHOS 104 99 116  BILITOT 0.5 0.5 0.4   Iron/TIBC/Ferritin/ %Sat No results found for: IRON, TIBC, FERRITIN, IRONPCTSAT   01/29/2018 baseline CEA 11.6 RADIOGRAPHIC STUDIES: I have personally reviewed the radiological images as listed and agreed with the findings in the report. CT abdomen with contrast 07/23/2018 No evidence of metastatic disease in the abdomen.  CT chest with contrast shows scattered bilateral subcentimeter nodules unchanged from 01/16/2018.  Recommend CT follow-up in 3 months to ensure stability.  ASSESSMENT & PLAN:  1. Rectal cancer ([place])   2. Neuropathy due to chemotherapeutic drug ([place])   3. Port-A-Cath in place   4. Macrocytic anemia   5. Lung nodules   Cancer Staging Rectal cancer [facility]) Staging form: Colon and Rectum, AJCC 8th Edition - Clinical stage from 01/29/2018: Stage IIA (cT3, cN0, cM0) - Signed by Earlie Server, MD on 02/01/2018  #Low rectal cancer, plan total neoadjuvant therapy.chemotherapy regimen with FOLFOX Q2w x 4 months, followed by concurrent Xeloda with RT,  Status post local excision. 09/25/2018 rectum local excision at [facility] Pathology showed minute focus of residual viable adenocarcinoma involving submucosa and superficial muscularis propria. Margins are negative. Patient opted to have local excision without proceeding with APR.  ypT1 he will follow-up with [facility] surgery to have rectal exam in 3 months.  Discussed with patient about surveillance plan History and physical examination every 3-6 months for 2 years and then every 6 months for total of 5 years. Obtain CEA every 3 months for 2 years and then every 6 months for total of 5 years. CT chest abdomen pelvis every 6 months for 5 years. Colonoscopy 1 year after surgery. Patient  is scheduled to have repeat MRI at [facility] at 6 months post surgery.  Repeat CEA, CBC, CMP in 3 months and follow-up in the clinic.  #Lung nodules, plan repeat CT chest without contrast in 3 months. # Grade 1 Neuropathy, stable.  We discussed about using gabapentin to manage his  symptoms.  He is not interested.  # Macrocytic anemia, labs are reviewed and discussed with patient.   Hemoglobin has resolved.  MCV remains elevated at 102.  Check vitamin B12 at next visit. # Port needs to be flushed every 6 weeks.   Return of visit: 3 months.   Earlie Server, MD, PhD 10/14/2018

## 2018-11-02 ENCOUNTER — Ambulatory Visit: Admission: RE | Admit: 2018-11-02 | Payer: Medicaid Other | Source: Ambulatory Visit

## 2018-11-16 ENCOUNTER — Other Ambulatory Visit: Payer: Self-pay

## 2018-11-16 ENCOUNTER — Encounter: Payer: Self-pay | Admitting: Radiation Oncology

## 2018-11-16 ENCOUNTER — Ambulatory Visit
Admission: RE | Admit: 2018-11-16 | Discharge: 2018-11-16 | Disposition: A | Discharge status: Home / Self Care | Payer: Medicaid Other | Source: Ambulatory Visit | Attending: Radiation Oncology | Admitting: Radiation Oncology

## 2018-11-16 VITALS — BP 151/100 | HR 82 | Temp 97.3°F | Resp 16 | Wt 196.1 lb

## 2018-11-16 DIAGNOSIS — Z85048 Personal history of other malignant neoplasm of rectum, rectosigmoid junction, and anus: Secondary | ICD-10-CM | POA: Diagnosis present

## 2018-11-16 DIAGNOSIS — Z923 Personal history of irradiation: Secondary | ICD-10-CM | POA: Insufficient documentation

## 2018-11-16 DIAGNOSIS — Z9221 Personal history of antineoplastic chemotherapy: Secondary | ICD-10-CM | POA: Diagnosis not present

## 2018-11-16 DIAGNOSIS — C20 Malignant neoplasm of rectum: Secondary | ICD-10-CM

## 2018-11-16 NOTE — Progress Notes (Signed)
Radiation Oncology Follow up Note  Name: Jeffery Mccormick   Date:   11/16/2018 MRN:  [id number] DOB: 1965-11-04    This 53 y.o. male presents to the clinic today for 25-month follow-up status post neoadjuvant concurrent chemoradiation therapy for locally advanced adenocarcinoma of the rectum.  REFERRING PROVIDER: Medicine, Arletha Pili*  HPI: Patient is a 53 year old male who presented with a T3 lesion 5 to 6 cm from the anal verge biopsy positive for adenocarcinoma of the rectum.  He underwent.  Resection at [facility] showing only a minute focus of residual viable adenocarcinoma involving the submucosa and superficial muscularis propria.  Margins of resection were negative closest approach 2 mm from the deep margin.  No lymph nodes were submitted.  Patient is under active surveillance.  Patient state he still somewhat sore although specifically denies blood per rectum marked abdominal pain or discomfort or any urinary complaints.  Patient did not have a colostomy performed.  COMPLICATIONS OF TREATMENT: none  FOLLOW UP COMPLIANCE: keeps appointments   PHYSICAL EXAM:  BP (!) 151/100 (BP Location: Left Arm, Patient Position: Sitting)   Pulse 82   Temp (!) 97.3 F (36.3 C) (Tympanic)   Resp 16   Wt 196 lb 1.6 oz (89 kg)   BMI 28.14 kg/m  Well-developed well-nourished patient in NAD. HEENT reveals PERLA, EOMI, discs not visualized.  Oral cavity is clear. No oral mucosal lesions are identified. Neck is clear without evidence of cervical or supraclavicular adenopathy. Lungs are clear to A&P. Cardiac examination is essentially unremarkable with regular rate and rhythm without murmur rub or thrill. Abdomen is benign with no organomegaly or masses noted. Motor sensory and DTR levels are equal and symmetric in the upper and lower extremities. Cranial nerves II through XII are grossly intact. Proprioception is intact. No peripheral adenopathy or edema is identified. No motor or sensory levels are noted. Crude  visual fields are within normal range.  RADIOLOGY RESULTS: No current films to review  PLAN: Present time patient is doing well with no evidence of disease excellent response to neoadjuvant chemoradiation therapy prior to surgical resection of his rectal cancer.  I am pleased with his overall progress.  I have asked to see him back in 6 months for follow-up.  He continues close follow-up care by medical oncology.  I would like to take this opportunity to thank you for allowing me to participate in the care of your patient.Noreene Filbert, MD

## 2018-11-24 ENCOUNTER — Inpatient Hospital Stay: Payer: Medicaid Other | Attending: Oncology

## 2018-11-24 ENCOUNTER — Other Ambulatory Visit: Payer: Self-pay

## 2018-11-24 DIAGNOSIS — C20 Malignant neoplasm of rectum: Secondary | ICD-10-CM | POA: Insufficient documentation

## 2018-11-24 DIAGNOSIS — Z452 Encounter for adjustment and management of vascular access device: Secondary | ICD-10-CM | POA: Diagnosis present

## 2018-11-24 DIAGNOSIS — Z95828 Presence of other vascular implants and grafts: Secondary | ICD-10-CM

## 2018-11-24 DIAGNOSIS — R918 Other nonspecific abnormal finding of lung field: Secondary | ICD-10-CM | POA: Diagnosis present

## 2018-11-24 MED ORDER — HEPARIN SOD (PORK) LOCK FLUSH 100 UNIT/ML IV SOLN
INTRAVENOUS | Status: AC
  Filled 2018-11-24: qty 5

## 2018-11-24 MED ORDER — HEPARIN SOD (PORK) LOCK FLUSH 100 UNIT/ML IV SOLN
500.0000 [IU] | Freq: Once | INTRAVENOUS | Status: AC
  Administered 2018-11-24: 500 [IU] via INTRAVENOUS

## 2018-11-24 MED ORDER — SODIUM CHLORIDE 0.9% FLUSH
10.0000 mL | Freq: Once | INTRAVENOUS | Status: AC
  Administered 2018-11-24: 10 mL via INTRAVENOUS
  Filled 2018-11-24: qty 10

## 2018-12-07 ENCOUNTER — Telehealth: Payer: Self-pay

## 2018-12-07 NOTE — Telephone Encounter (Signed)
Telephone call to patient to discuss SCP visit.  Message left and wanting for return call to set up appointment.

## 2018-12-10 ENCOUNTER — Telehealth: Payer: Self-pay

## 2018-12-10 NOTE — Telephone Encounter (Signed)
Received telephone call from wife Selinda Eon and she states to go ahead and mail SCP and treatment summary and I will call them in 2 weeks on 12/24/2018 to reviewed SCP and discuss post chemo care.  Packet mailed

## 2018-12-24 ENCOUNTER — Encounter: Payer: Self-pay | Admitting: *Deleted

## 2018-12-24 ENCOUNTER — Inpatient Hospital Stay: Payer: Medicaid Other | Attending: Oncology | Admitting: Oncology

## 2018-12-24 DIAGNOSIS — G629 Polyneuropathy, unspecified: Secondary | ICD-10-CM | POA: Diagnosis not present

## 2018-12-24 DIAGNOSIS — Z08 Encounter for follow-up examination after completed treatment for malignant neoplasm: Secondary | ICD-10-CM | POA: Diagnosis not present

## 2018-12-24 DIAGNOSIS — C20 Malignant neoplasm of rectum: Secondary | ICD-10-CM

## 2018-12-24 DIAGNOSIS — Z8501 Personal history of malignant neoplasm of esophagus: Secondary | ICD-10-CM | POA: Diagnosis not present

## 2018-12-24 NOTE — Progress Notes (Signed)
Survivorship Care Plan visit completed.  Treatment summary reviewed and previously mailed to patient.  ASCO answers booklet reviewed and given to patient.  CARE program and Cancer Transitions discussed with patient along with other resources cancer center offers to patients and caregivers.  Patient verbalized understanding. 

## 2018-12-24 NOTE — Progress Notes (Signed)
Survivorship Clinic Consult Note [facility]  [phone] [phone]  CLINIC:  Survivorship  REASON FOR VISIT:  Long-term survivorship surveillance visit for patient with history of rectal cancer  I connected with Pollie Meyer on 12/24/18 at  2:30 PM EDT by telephone visit and verified that I am speaking with the correct person using two identifiers.   I discussed the limitations, risks, security and privacy concerns of performing an evaluation and management service by telemedicine and the availability of in-person appointments. I also discussed with the patient that there may be a patient responsible charge related to this service. The patient expressed understanding and agreed to proceed.   Other persons participating in the visit and their role in the encounter:  Delno Irias- Patients wife  Patients location: Home  Providers location: Office  BRIEF ONCOLOGIC HISTORY:  Oncology History  Rectal cancer ([place])  01/29/2018 Cancer Staging   Staging form: Colon and Rectum, AJCC 8th Edition - Clinical stage from 01/29/2018: Stage IIA (cT3, cN0, cM0) - Signed by Earlie Server, MD on 02/01/2018   02/01/2018 Initial Diagnosis   Rectal cancer ([place])   02/09/2018 -  Chemotherapy   The patient had palonosetron (ALOXI) injection 0.25 mg, 0.25 mg, Intravenous,  Once, 8 of 8 cycles Administration: 0.25 mg (02/09/2018), 0.25 mg (02/23/2018), 0.25 mg (03/09/2018), 0.25 mg (03/23/2018), 0.25 mg (04/06/2018), 0.25 mg (04/20/2018), 0.25 mg (05/04/2018), 0.25 mg (05/18/2018) leucovorin 800 mg in dextrose 5 % 250 mL infusion, 816 mg, Intravenous,  Once, 8 of 8 cycles Administration: 800 mg (02/09/2018), 800 mg (02/23/2018), 800 mg (03/09/2018), 800 mg (03/23/2018), 800 mg (04/06/2018), 800 mg (04/20/2018), 800 mg (05/04/2018), 800 mg (05/18/2018) oxaliplatin (ELOXATIN) 175 mg in dextrose 5 % 500 mL chemo infusion, 85 mg/m2 = 175 mg, Intravenous,  Once, 8 of 8  cycles Dose modification: 75 mg/m2 (original dose 85 mg/m2, Cycle 8, Reason: Other (see comments), Comment: neuropathy) Administration: 175 mg (02/09/2018), 175 mg (02/23/2018), 175 mg (03/09/2018), 175 mg (03/23/2018), 175 mg (04/06/2018), 175 mg (04/20/2018), 175 mg (05/04/2018), 150 mg (05/18/2018) fluorouracil (ADRUCIL) chemo injection 800 mg, 400 mg/m2 = 800 mg, Intravenous,  Once, 7 of 7 cycles Administration: 800 mg (02/09/2018), 800 mg (02/23/2018), 800 mg (03/09/2018), 800 mg (03/23/2018), 800 mg (04/06/2018), 800 mg (04/20/2018), 800 mg (05/18/2018) fluorouracil (ADRUCIL) 4,900 mg in sodium chloride 0.9 % 52 mL chemo infusion, 2,400 mg/m2 = 4,900 mg, Intravenous, 1 Day/Dose, 8 of 8 cycles Administration: 4,900 mg (02/09/2018), 4,900 mg (02/23/2018), 4,900 mg (03/09/2018), 4,900 mg (03/23/2018), 4,900 mg (04/06/2018), 4,900 mg (04/20/2018), 4,900 mg (05/04/2018), 4,900 mg (05/18/2018)  for chemotherapy treatment.      INTERVAL HISTORY:  Patient with initial diagnosis after having a colonoscopy on 12/25/2017 revealing an anal mass that was 5 to 6 cm from the anal verge.  Subsequently had CT chest abdomen pelvis in October 2019 which showed wall thickening involving the lower rectum.  No findings of metastatic disease.  He was referred to [facility] and evaluated by Dr. Cecil Cobbs.  They discussed local excision versus more radical surgery.  Proceeded with local excision.  Pathology showed tiny focus of invasion but clear margins.  He decided to proceed with close surveillance instead of APR.  Plan is for MRI and EUA in 6 months and follow-up with surgery in 3.  He is status post FOLFOX every 2 weeks x 4 months followed by concurrent Xeloda with radiation.  Currently on surveillance.  Plan is for physical examination and history every 3  to 6 months for 2 years then every 6 months for total of 5 years.  They are to obtain a CEA every 3 months for 2 years then every 6 months for total of 5 years.  CT chest abdomen  pelvis every 6 months for 5 years.  Colonoscopy 1 year after surgery.  He is scheduled to have a repeat MRI at [facility] in 6 months.  He received 8 cycles FOLFOX followed by concurrent Xeloda and radiation completing in April 2020.  ADDITIONAL REVIEW OF SYSTEMS:  Review of Systems  Constitutional: Positive for malaise/fatigue.       Weight gain  Neurological: Positive for sensory change.    PAST MEDICAL & SURGICAL HISTORY:  Past Medical History:  Diagnosis Date   Hemorrhoid prolapse    Past Surgical History:  Procedure Laterality Date   COLONOSCOPY WITH PROPOFOL N/A 12/25/2017   Procedure: COLONOSCOPY WITH PROPOFOL;  Surgeon: Benjamine Sprague, DO;  Location: [facility] ENDOSCOPY;  Service: General;  Laterality: N/A;   HERNIA REPAIR  07/2013   Right Inguinal-Dr. Burt Knack   PORTACATH PLACEMENT Right 02/03/2018   Procedure: INSERTION PORT-A-CATH;  Surgeon: Benjamine Sprague, DO;  Location: [facility] ORS;  Service: General;  Laterality: Right;    SOCIAL HISTORY:  None   CURRENT MEDICATIONS:  Current Outpatient Medications on File Prior to Visit  Medication Sig Dispense Refill   clotrimazole-betamethasone (LOTRISONE) cream Apply 1 application topically 2 (two) times daily. (Patient not taking: Reported on 08/10/2018) 30 g 0   gabapentin (NEURONTIN) 300 MG capsule TAKE 1 CAPSULE BY MOUTH TWICE A DAY (Patient not taking: Reported on 08/10/2018) 180 capsule 1   lidocaine-prilocaine (EMLA) cream Apply to affected area once 30 g 3   No current facility-administered medications on file prior to visit.     ALLERGIES:  No Known Allergies  PHYSICAL EXAM:  There were no vitals filed for this visit. There were no vitals filed for this visit.  Limited d/t telephone visit  DIAGNOSTIC IMAGING:   CT abdomen- 01/16/18 IMPRESSION: Wall thickening involving the lower rectum, corresponding to the patient's known rectal cancer.  No findings specific for metastatic disease.  Scattered small bilateral  pulmonary nodules measuring up to 3 mm, nonspecific but not considered overly suspicious for metastatic disease. Consider follow-up CT chest in 3-6 months. Please note the Fleischner Society guidelines do not apply in the setting of known malignancy.   ASSESSMENT & PLAN:  Mr. Peskin is a pleasant 53 y.o. male with history of stage IIa low rectal cancer, treated with FOLFOX every 2 weeks for 4 months followed by concurrent Xeloda with radiation he also had local excision.; completed treatment in April 2020.  Patient presents to survivorship clinic today for survivorship care plan visit and to address any acute survivorship concerns since completing treatment.    1. History of rectal cancer: Clinically, he is without evidence of disease recurrence based on physical exam/diagnostic imaging.  Today, he received a copy of his survivorship care plan (SCP) document, which was reviewed with him in detail.  The SCP details his cancer treatment history and potential late/long-term side effects of those treatments.  We discussed the follow-up schedule he can anticipate with interval imaging for surveillance of his cancer.  I have also shared a copy of his treatment summary/SCP with his PCP.  Mr. Cruces will return to the survivorship clinic as needed; he will return to [place] at [facility] for surveillance visit with Dr. Tasia Catchings next month.    2. Problem at visit:  Worsening neuropathy: Patient states neuropathy has continued to worsen over the past few months.  He was prescribed gabapentin but reviewed side effects and decided he did not want to try.  We spoke at length about acupuncture and he is willing to give this a try.  Magdalene Patricia, survivorship RN will arrange.  He is scheduled to see Dr. Tasia Catchings back in October for reassessment.  Has history of lung nodules requiring follow-up.  I see where a CT chest was ordered but not scheduled.  We will get this scheduled ASAP.  Last imaging was from October 2019.    3. Smoking cessation: I commended Mr. Lumb's continued efforts to remain tobacco-free.  We discussed that one of the most important risk reduction strategies in preventing cancer recurrence in lung cancer patients is smoking cessation.  He is committed to abstaining from tobacco.  4. Physical activity/Healthy eating: Getting adequate physical activity and maintaining a healthy diet as a cancer survivor is important for overall wellness and reduces the risk of cancer recurrence. We discussed the CARE program which is a fitness program that is offered to cancer survivors free of charge.  We also reviewed the American Cancer Society's booklet with recommendations for nutrition and physical activity.    4. Health promotion/Cancer screening:  Mr. Bonilla is reportedly up-to-date on his colonoscopy, pap smear, PSA tests, skin screenings, and vaccinations.  I encouraged him to talk with his PCP about arranging appropriate cancer screening tests, as appropriate.   5. Support services/Counseling: Mr. Skillin was seen today in in effort to address both the physical and social concerns of our cancer survivors at [facility]. It is not uncommon for this period of the patient's cancer care trajectory to be one of many emotions and stressors.  I provided support today through active listening, validation of concerns, and expressive supportive counseling.  Mr. Heick was encouraged to take advantage of our support services programs and support groups to better cope in his new life as a cancer survivor after completing anti-cancer treatment.   Dispo:  -Return to survivorship clinic as needed; no additional follow-up needed at this time.  -RTC as scheduled in October for surveillance lab work, MD assessment and history. -Scheduled his CT chest to be completed on 12/28/2018 at 4:30 PM.  Patient is aware. -Referral for acupuncture for worsening bilateral lower extremity peripheral neuropathy.  Not  interested in pursuing gabapentin. -Keep scheduled follow-up with Dr. Cecil Cobbs [facility]) on 04/05/19 for MRI and colonoscopy.  A total of 30 minutes was spent in face-to-face care of this patient, with greater than 50% of that time spent in counseling and care coordination.   Rulon Abide, AGNP-C [place] [facility] [place] (office) 12/24/18 3:06 PM

## 2018-12-28 ENCOUNTER — Other Ambulatory Visit: Payer: Self-pay

## 2018-12-28 ENCOUNTER — Ambulatory Visit
Admission: RE | Admit: 2018-12-28 | Discharge: 2018-12-28 | Disposition: A | Discharge status: Home / Self Care | Payer: Medicaid Other | Source: Ambulatory Visit | Attending: Oncology | Admitting: Oncology

## 2018-12-28 DIAGNOSIS — D539 Nutritional anemia, unspecified: Secondary | ICD-10-CM | POA: Diagnosis present

## 2018-12-28 DIAGNOSIS — Z95828 Presence of other vascular implants and grafts: Secondary | ICD-10-CM | POA: Diagnosis present

## 2018-12-28 DIAGNOSIS — G620 Drug-induced polyneuropathy: Secondary | ICD-10-CM | POA: Diagnosis present

## 2018-12-28 DIAGNOSIS — C20 Malignant neoplasm of rectum: Secondary | ICD-10-CM | POA: Diagnosis not present

## 2018-12-28 DIAGNOSIS — R918 Other nonspecific abnormal finding of lung field: Secondary | ICD-10-CM | POA: Diagnosis present

## 2018-12-28 DIAGNOSIS — T451X5A Adverse effect of antineoplastic and immunosuppressive drugs, initial encounter: Secondary | ICD-10-CM | POA: Insufficient documentation

## 2019-01-05 ENCOUNTER — Other Ambulatory Visit: Payer: Self-pay

## 2019-01-05 ENCOUNTER — Inpatient Hospital Stay: Payer: Medicaid Other

## 2019-01-18 NOTE — Progress Notes (Signed)
Patient is coming in tomorrow, would like to know CT scan results.

## 2019-01-19 ENCOUNTER — Encounter: Payer: Self-pay | Admitting: Oncology

## 2019-01-19 ENCOUNTER — Inpatient Hospital Stay: Payer: Medicaid Other | Attending: Oncology

## 2019-01-19 ENCOUNTER — Inpatient Hospital Stay (HOSPITAL_BASED_OUTPATIENT_CLINIC_OR_DEPARTMENT_OTHER): Payer: Medicaid Other | Admitting: Oncology

## 2019-01-19 ENCOUNTER — Other Ambulatory Visit: Payer: Self-pay

## 2019-01-19 VITALS — BP 129/82 | HR 82 | Temp 98.5°F | Resp 18 | Wt 201.8 lb

## 2019-01-19 DIAGNOSIS — Z95828 Presence of other vascular implants and grafts: Secondary | ICD-10-CM

## 2019-01-19 DIAGNOSIS — Z79899 Other long term (current) drug therapy: Secondary | ICD-10-CM | POA: Insufficient documentation

## 2019-01-19 DIAGNOSIS — R918 Other nonspecific abnormal finding of lung field: Secondary | ICD-10-CM | POA: Diagnosis not present

## 2019-01-19 DIAGNOSIS — C20 Malignant neoplasm of rectum: Secondary | ICD-10-CM | POA: Insufficient documentation

## 2019-01-19 DIAGNOSIS — F17210 Nicotine dependence, cigarettes, uncomplicated: Secondary | ICD-10-CM | POA: Insufficient documentation

## 2019-01-19 DIAGNOSIS — T451X5A Adverse effect of antineoplastic and immunosuppressive drugs, initial encounter: Secondary | ICD-10-CM

## 2019-01-19 DIAGNOSIS — G620 Drug-induced polyneuropathy: Secondary | ICD-10-CM

## 2019-01-19 LAB — CBC WITH DIFFERENTIAL/PLATELET
Abs Immature Granulocytes: 0.02 10*3/uL (ref 0.00–0.07)
Basophils Absolute: 0.1 10*3/uL (ref 0.0–0.1)
Basophils Relative: 1 %
Eosinophils Absolute: 0.2 10*3/uL (ref 0.0–0.5)
Eosinophils Relative: 3 %
HCT: 42 % (ref 39.0–52.0)
Hemoglobin: 14.6 g/dL (ref 13.0–17.0)
Immature Granulocytes: 0 %
Lymphocytes Relative: 20 %
Lymphs Abs: 1.3 10*3/uL (ref 0.7–4.0)
MCH: 33.6 pg (ref 26.0–34.0)
MCHC: 34.8 g/dL (ref 30.0–36.0)
MCV: 96.6 fL (ref 80.0–100.0)
Monocytes Absolute: 0.6 10*3/uL (ref 0.1–1.0)
Monocytes Relative: 10 %
Neutro Abs: 4.1 10*3/uL (ref 1.7–7.7)
Neutrophils Relative %: 66 %
Platelets: 189 10*3/uL (ref 150–400)
RBC: 4.35 MIL/uL (ref 4.22–5.81)
RDW: 13.9 % (ref 11.5–15.5)
WBC: 6.3 10*3/uL (ref 4.0–10.5)
nRBC: 0 % (ref 0.0–0.2)

## 2019-01-19 LAB — COMPREHENSIVE METABOLIC PANEL
ALT: 37 U/L (ref 0–44)
AST: 27 U/L (ref 15–41)
Albumin: 4.2 g/dL (ref 3.5–5.0)
Alkaline Phosphatase: 98 U/L (ref 38–126)
Anion gap: 11 (ref 5–15)
BUN: 11 mg/dL (ref 6–20)
CO2: 22 mmol/L (ref 22–32)
Calcium: 9.1 mg/dL (ref 8.9–10.3)
Chloride: 106 mmol/L (ref 98–111)
Creatinine, Ser: 0.96 mg/dL (ref 0.61–1.24)
GFR calc Af Amer: >60 mL/min (ref >60)
GFR calc non Af Amer: >60 mL/min (ref >60)
Glucose, Bld: 123 mg/dL — ABNORMAL HIGH (ref 70–99)
Potassium: 3.8 mmol/L (ref 3.5–5.1)
Sodium: 139 mmol/L (ref 135–145)
Total Bilirubin: 0.6 mg/dL (ref 0.3–1.2)
Total Protein: 7.5 g/dL (ref 6.5–8.1)

## 2019-01-19 LAB — VITAMIN B12: Vitamin B-12: 215 pg/mL (ref 180–914)

## 2019-01-19 MED ORDER — SODIUM CHLORIDE 0.9% FLUSH
10.0000 mL | Freq: Once | INTRAVENOUS | Status: AC
  Administered 2019-01-19: 14:00:00 10 mL via INTRAVENOUS
  Filled 2019-01-19: qty 10

## 2019-01-19 MED ORDER — HEPARIN SOD (PORK) LOCK FLUSH 100 UNIT/ML IV SOLN
500.0000 [IU] | Freq: Once | INTRAVENOUS | Status: AC
  Administered 2019-01-19: 14:00:00 500 [IU] via INTRAVENOUS

## 2019-01-19 MED ORDER — VITAMIN B-12 1000 MCG PO TABS: 1000.0000 ug | ORAL_TABLET | Freq: Every day | ORAL | 1 refills | Status: DC

## 2019-01-19 NOTE — Progress Notes (Signed)
Hematology/Oncology follow-up note [facility] [phone] [phone]   Patient Care Team: Medicine, [facility] as PCP - General [name], RN as Registered Nurse Noreene Filbert, MD as Referring Physician (Radiation Oncology) Earlie Server, MD as Consulting Physician (Oncology) Benjamine Sprague, DO as Consulting Physician (Surgery) Stitzenberg, Clint Lipps, MD as Referring Physician (Surgical Oncology)  REFERRING PROVIDER: Dr.Thomas CHIEF COMPLAINTS/REASON FOR VISIT:  Follow-up for treatment of rectal cancer, assessment of tolerability of chemotherapy.  HISTORY OF PRESENTING ILLNESS:  Jeffery Mccormick is a  53 y.o.  male with PMH listed below who was referred to me for evaluation of rectal cancer.  Rectal cancer cT3 N0 M0. 12/25/2017 colonoscopy : revealed anal mass, 5-6 cm from the anal verge.  # CT chest abdomen pelvis 01/16/2018 showed Wall thickening involving the lower rectum, corresponding to the patient's known rectal cancer. No findings specific for metastatic disease. Scattered small bilateral pulmonary nodules measuring up to 3 mm, nonspecific but not considered overly suspicious for metastatic disease.  #01/23/2018 MRI pelvis with and without contrast T3N0  #Cancer treatment TNT neoadjuvant protocol #02/09/2018 to 05/18/2018 FOLFOX q. 14 days x 8 #07/10/2018 finished concurrent chemo with Xeloda/radiation  # Low rectal cancer, s/p total neoadjuvant therapy.chemotherapy regimen with FOLFOX Q2w x 4 months, followed by concurrent Xeloda with RT,  Status post local excision- ypT1 disease,  09/25/2018 rectum local excision at [facility] Pathology showed minute focus of residual viable adenocarcinoma involving submucosa and superficial muscularis propria. Margins are negative. Patient opted to have local excision without proceeding with APR.  ypT1   INTERVAL HISTORY Jeffery Mccormick is a 53 y.o. male who has above history reviewed by me  today presents for rectal cancer cT3 N0 M0, status post total neoadjuvant treatments, status post local excision, ypT1 Patient reports doing well. He was recently seen by surgeon at [facility].  No clinical signs of recurrence. Patient has a follow-up appointment with [facility] in December with plan of repeat MRI and a colonoscopy.  Denies fever, chills, nausea, vomiting, diarrhea, chest pain, cough, shortness of breath, abdominal pain, urinary symptoms, lower extremity swelling.  Denies any difficulty in passing bowel movement or blood in the stool. He had CT chest scan done for follow-up of lung nodules. Residual chemotherapy-induced neuropathy, stable.  Review of Systems  Constitutional: Negative for chills, fever, malaise/fatigue and weight loss.  HENT: Negative for nosebleeds and sore throat.   Eyes: Negative for double vision, photophobia and redness.  Respiratory: Negative for cough, shortness of breath and wheezing.   Cardiovascular: Negative for chest pain, palpitations, orthopnea and leg swelling.  Gastrointestinal: Negative for abdominal pain, blood in stool, diarrhea, nausea and vomiting.  Genitourinary: Negative for dysuria.  Musculoskeletal: Negative for back pain, myalgias and neck pain.  Skin: Negative for itching and rash.  Neurological: Positive for tingling. Negative for dizziness and tremors.  Endo/Heme/Allergies: Negative for environmental allergies. Does not bruise/bleed easily.  Psychiatric/Behavioral: Negative for depression and hallucinations.    MEDICAL HISTORY:  Past Medical History:  Diagnosis Date  . Hemorrhoid prolapse     SURGICAL HISTORY: Past Surgical History:  Procedure Laterality Date  . COLONOSCOPY WITH PROPOFOL N/A 12/25/2017   Procedure: COLONOSCOPY WITH PROPOFOL;  Surgeon: Benjamine Sprague, DO;  Location: [facility] [facility];  Service: General;  Laterality: N/A;  . HERNIA REPAIR  07/2013   Right Inguinal-Dr. Burt Knack  . PORTACATH PLACEMENT Right 02/03/2018    Procedure: INSERTION PORT-A-CATH;  Surgeon: Benjamine Sprague, DO;  Location: [facility];  Service: General;  Laterality: Right;    SOCIAL HISTORY: Social History   Socioeconomic History  . Marital status: Married    Spouse name: Not on file  . Number of children: Not on file  . Years of education: Not on file  . Highest education level: Not on file  Occupational History  . Occupation: unemployed  Social Needs  . Financial resource strain: Not on file  . Food insecurity    Worry: Not on file    Inability: Not on file  . Transportation needs    Medical: Not on file    Non-medical: Not on file  Tobacco Use  . Smoking status: Current Every Day Smoker    Packs/day: 1.00    Years: 30.00    Pack years: 30.00    Types: Cigarettes  . Smokeless tobacco: Never Used  Substance and Sexual Activity  . Alcohol use: Yes    Frequency: Never    Comment: occasionally  . Drug use: No  . Sexual activity: Not on file  Lifestyle  . Physical activity    Days per week: Not on file    Minutes per session: Not on file  . Stress: Not on file  Relationships  . Social Herbalist on phone: Not on file    Gets together: Not on file    Attends religious service: Not on file    Active member of club or organization: Not on file    Attends meetings of clubs or organizations: Not on file    Relationship status: Not on file  . Intimate partner violence    Fear of current or ex partner: Not on file    Emotionally abused: Not on file    Physically abused: Not on file    Forced sexual activity: Not on file  Other Topics Concern  . Not on file  Social History Narrative  . Not on file    FAMILY HISTORY: Family History  Family history unknown: Yes  patient reports that he does not know any family history.   ALLERGIES:  has No Known Allergies.  MEDICATIONS:  Current Outpatient Medications  Medication Sig Dispense Refill  . lidocaine-prilocaine (EMLA) cream Apply to affected area once 30  g 3  . clotrimazole-betamethasone (LOTRISONE) cream Apply 1 application topically 2 (two) times daily. (Patient not taking: Reported on 08/10/2018) 30 g 0  . gabapentin (NEURONTIN) 300 MG capsule TAKE 1 CAPSULE BY MOUTH TWICE A DAY (Patient not taking: Reported on 08/10/2018) 180 capsule 1   No current facility-administered medications for this visit.    RADIOGRAPHIC STUDIES: I have personally reviewed the radiological images as listed and agreed with the findings in the report. Ct Chest Wo Contrast  Result Date: 12/29/2018 CLINICAL DATA:  History of pulmonary nodules and colon cancer. EXAM: CT CHEST WITHOUT CONTRAST TECHNIQUE: Multidetector CT imaging of the chest was performed following the standard protocol without IV contrast. COMPARISON:  01/16/2018. FINDINGS: Cardiovascular: The heart size is normal. No substantial pericardial effusion. Coronary artery calcification is evident. Atherosclerotic calcification is noted in the wall of the thoracic aorta. Right Port-A-Cath tip is positioned at the SVC/RA junction. Mediastinum/Nodes: No mediastinal lymphadenopathy. No evidence for gross hilar lymphadenopathy although assessment is limited by the lack of intravenous contrast on today's study. The esophagus has normal imaging features. There is no axillary lymphadenopathy. Lungs/Pleura: Previously identified scattered tiny bilateral pulmonary nodules are stable. 3 mm nodule left upper lobe on 74/3 is new in the interval. No new suspicious pulmonary  nodule or mass. No focal airspace consolidation. No pleural effusion. Upper Abdomen: Unremarkable. Musculoskeletal: No worrisome lytic or sclerotic osseous abnormality. IMPRESSION: 1. Tiny scattered bilateral pulmonary nodules identified previously are stable and now compatible with benign etiology given the 1 year interval follow-up. There is a new 3 mm left upper lobe nodule on today's study. No follow-up needed if patient is low-risk. Non-contrast chest CT can be  considered in 12 months if patient is high-risk. This recommendation follows the consensus statement: Guidelines for Management of Incidental Pulmonary Nodules Detected on CT Images: From the Fleischner Society 2017; Radiology 2017; 284:228-243. 2.  Aortic Atherosclerois (ICD10-170.0) Electronically Signed   By: Misty Stanley M.D.   On: 12/29/2018 08:14     PHYSICAL EXAMINATION: ECOG PERFORMANCE STATUS: 1 - Symptomatic but completely ambulatory Vitals:   01/19/19 1416  BP: 129/82  Pulse: 82  Resp: 18  Temp: 98.5 F (36.9 C)   Filed Weights   01/19/19 1416  Weight: 201 lb 12.8 oz (91.5 kg)    Physical Exam Constitutional:      General: He is not in acute distress. HENT:     Head: Normocephalic and atraumatic.  Eyes:     General: No scleral icterus.    Pupils: Pupils are equal, round, and reactive to light.  Neck:     Musculoskeletal: Normal range of motion and neck supple.  Cardiovascular:     Rate and Rhythm: Normal rate and regular rhythm.     Heart sounds: Normal heart sounds.  Pulmonary:     Effort: Pulmonary effort is normal. No respiratory distress.     Breath sounds: No wheezing.  Abdominal:     General: Bowel sounds are normal. There is no distension.     Palpations: Abdomen is soft. There is no mass.     Tenderness: There is no abdominal tenderness.  Musculoskeletal: Normal range of motion.        General: No deformity.  Skin:    General: Skin is warm and dry.     Findings: No erythema or rash.  Neurological:     Mental Status: He is alert and oriented to person, place, and time.     Cranial Nerves: No cranial nerve deficit.     Coordination: Coordination normal.  Psychiatric:        Behavior: Behavior normal.        Thought Content: Thought content normal.      LABORATORY DATA:  I have reviewed the data as listed Lab Results  Component Value Date   WBC 6.3 01/19/2019   HGB 14.6 01/19/2019   HCT 42.0 01/19/2019   MCV 96.6 01/19/2019   PLT 189  01/19/2019   Recent Labs    07/15/18 0940 10/13/18 1323 01/19/19 1355  NA 139 138 139  K 4.0 3.8 3.8  CL 109 105 106  CO2 25 25 22   GLUCOSE 103* 113* 123*  BUN 11 10 11   CREATININE 0.88 0.78 0.96  CALCIUM 8.8* 8.9 9.1  GFRNONAA >60 >60 >60  GFRAA >60 >60 >60  PROT 7.0 7.3 7.5  ALBUMIN 4.0 3.9 4.2  AST 21 21 27   ALT 17 23 37  ALKPHOS 99 116 98  BILITOT 0.5 0.4 0.6   Iron/TIBC/Ferritin/ %Sat No results found for: IRON, TIBC, FERRITIN, IRONPCTSAT   01/29/2018 baseline CEA 11.6 RADIOGRAPHIC STUDIES: I have personally reviewed the radiological images as listed and agreed with the findings in the report. CT abdomen with contrast 07/23/2018 No evidence of metastatic disease  in the abdomen.  CT chest with contrast shows scattered bilateral subcentimeter nodules unchanged from 01/16/2018.  Recommend CT follow-up in 3 months to ensure stability.  ASSESSMENT & PLAN:  1. Rectal cancer ([place])   2. Port-A-Cath in place   3. Neuropathy due to chemotherapeutic drug ([place])   4. Lung nodules   Cancer Staging Rectal cancer [facility]) Staging form: Colon and Rectum, AJCC 8th Edition - Clinical stage from 01/29/2018: Stage IIA (cT3, cN0, cM0) - Signed by Earlie Server, MD on 02/01/2018  #Low rectal cancer, s/p total neoadjuvant therapy.chemotherapy regimen with FOLFOX Q2w x 4 months, followed by concurrent Xeloda with RT,  Status post local excision- ypT1 disease,  Continue follow-up with [facility] surgery. Plan repeat MRI pelvis in December and repeat colonoscopy in December at [facility]. Follow-up results. Labs reviewed and discussed with patient.  CEA is pending.  #Lung nodules, CT chest images were independently reviewed by me and discussed with patient. Stable no nodules, likely benign #Grade 1 neuropathy, stable.  Is not interested in continuing gabapentin.  Continue monitor. Patient has chronically low vitamin B12 level.  Repeat B12 level 215 recommend patient to take oral vitamin B12 1000 MCG daily.   This may help his neuropathy as well.  #Port-A-Cath in place, continue port flush every 6 weeks. Repeat CEA, CBC, CMP in 3 months and follow-up in the clinic. Return of visit: 3 months.   Earlie Server, MD, PhD 01/19/2019

## 2019-01-20 ENCOUNTER — Telehealth: Payer: Self-pay

## 2019-01-20 LAB — CEA: CEA: 2.7 ng/mL (ref 0.0–4.7)

## 2019-01-20 NOTE — Telephone Encounter (Signed)
Pt informed

## 2019-01-20 NOTE — Telephone Encounter (Signed)
-----   Message from Earlie Server, MD sent at 01/19/2019 11:49 PM EDT ----- Please let pt know that his vitamin b12 is borderline low and I recommend him to take oral vitamin B12 supplemention. Rx was sent to pharmacy. Thanks.

## 2019-03-02 ENCOUNTER — Inpatient Hospital Stay: Payer: Medicaid Other | Attending: Oncology

## 2019-03-02 ENCOUNTER — Other Ambulatory Visit: Payer: Self-pay

## 2019-03-02 DIAGNOSIS — Z95828 Presence of other vascular implants and grafts: Secondary | ICD-10-CM

## 2019-03-02 DIAGNOSIS — Z452 Encounter for adjustment and management of vascular access device: Secondary | ICD-10-CM | POA: Diagnosis present

## 2019-03-02 DIAGNOSIS — C20 Malignant neoplasm of rectum: Secondary | ICD-10-CM | POA: Insufficient documentation

## 2019-03-02 MED ORDER — SODIUM CHLORIDE 0.9% FLUSH
10.0000 mL | Freq: Once | INTRAVENOUS | Status: AC
  Administered 2019-03-02: 10 mL via INTRAVENOUS
  Filled 2019-03-02: qty 10

## 2019-03-02 MED ORDER — HEPARIN SOD (PORK) LOCK FLUSH 100 UNIT/ML IV SOLN
500.0000 [IU] | Freq: Once | INTRAVENOUS | Status: AC
  Administered 2019-03-02: 500 [IU] via INTRAVENOUS

## 2019-04-16 ENCOUNTER — Telehealth: Payer: Self-pay | Admitting: *Deleted

## 2019-04-16 NOTE — Telephone Encounter (Signed)
Per Dr. Tasia Catchings, ok to move appt after colonoscopy.

## 2019-04-16 NOTE — Telephone Encounter (Signed)
Wife called stating that patient has an appointment Tuesday for lab and to see doctor. She reports that he is scheduled for colonoscopy on Thursday and wants to know if he needs to come in to see Dr Tasia Catchings on Tuesday, or wait until after his colonoscopy. Please advise

## 2019-04-20 ENCOUNTER — Inpatient Hospital Stay: Payer: Medicaid Other

## 2019-04-20 ENCOUNTER — Inpatient Hospital Stay: Payer: Medicaid Other | Admitting: Oncology

## 2019-04-27 ENCOUNTER — Encounter: Payer: Self-pay | Admitting: Oncology

## 2019-04-27 ENCOUNTER — Inpatient Hospital Stay (HOSPITAL_BASED_OUTPATIENT_CLINIC_OR_DEPARTMENT_OTHER): Payer: Medicaid Other | Admitting: Oncology

## 2019-04-27 ENCOUNTER — Inpatient Hospital Stay: Payer: Medicaid Other | Attending: Oncology

## 2019-04-27 ENCOUNTER — Telehealth: Payer: Self-pay | Admitting: *Deleted

## 2019-04-27 ENCOUNTER — Other Ambulatory Visit: Payer: Self-pay

## 2019-04-27 VITALS — BP 129/77 | HR 77 | Temp 98.4°F | Resp 16 | Wt 199.5 lb

## 2019-04-27 DIAGNOSIS — C20 Malignant neoplasm of rectum: Secondary | ICD-10-CM

## 2019-04-27 DIAGNOSIS — T451X5A Adverse effect of antineoplastic and immunosuppressive drugs, initial encounter: Secondary | ICD-10-CM | POA: Diagnosis not present

## 2019-04-27 DIAGNOSIS — Z79899 Other long term (current) drug therapy: Secondary | ICD-10-CM | POA: Insufficient documentation

## 2019-04-27 DIAGNOSIS — Z95828 Presence of other vascular implants and grafts: Secondary | ICD-10-CM | POA: Diagnosis not present

## 2019-04-27 DIAGNOSIS — G620 Drug-induced polyneuropathy: Secondary | ICD-10-CM

## 2019-04-27 DIAGNOSIS — R918 Other nonspecific abnormal finding of lung field: Secondary | ICD-10-CM

## 2019-04-27 DIAGNOSIS — F17210 Nicotine dependence, cigarettes, uncomplicated: Secondary | ICD-10-CM | POA: Diagnosis not present

## 2019-04-27 LAB — COMPREHENSIVE METABOLIC PANEL
ALT: 29 U/L (ref 0–44)
AST: 22 U/L (ref 15–41)
Albumin: 4.3 g/dL (ref 3.5–5.0)
Alkaline Phosphatase: 94 U/L (ref 38–126)
Anion gap: 8 (ref 5–15)
BUN: 9 mg/dL (ref 6–20)
CO2: 24 mmol/L (ref 22–32)
Calcium: 9.4 mg/dL (ref 8.9–10.3)
Chloride: 105 mmol/L (ref 98–111)
Creatinine, Ser: 0.82 mg/dL (ref 0.61–1.24)
GFR calc Af Amer: >60 mL/min (ref >60)
GFR calc non Af Amer: >60 mL/min (ref >60)
Glucose, Bld: 101 mg/dL — ABNORMAL HIGH (ref 70–99)
Potassium: 4.2 mmol/L (ref 3.5–5.1)
Sodium: 137 mmol/L (ref 135–145)
Total Bilirubin: 0.6 mg/dL (ref 0.3–1.2)
Total Protein: 7.8 g/dL (ref 6.5–8.1)

## 2019-04-27 LAB — CBC WITH DIFFERENTIAL/PLATELET
Abs Immature Granulocytes: 0.02 10*3/uL (ref 0.00–0.07)
Basophils Absolute: 0.1 10*3/uL (ref 0.0–0.1)
Basophils Relative: 1 %
Eosinophils Absolute: 0.2 10*3/uL (ref 0.0–0.5)
Eosinophils Relative: 2 %
HCT: 46.6 % (ref 39.0–52.0)
Hemoglobin: 15.7 g/dL (ref 13.0–17.0)
Immature Granulocytes: 0 %
Lymphocytes Relative: 19 %
Lymphs Abs: 1.4 10*3/uL (ref 0.7–4.0)
MCH: 33.3 pg (ref 26.0–34.0)
MCHC: 33.7 g/dL (ref 30.0–36.0)
MCV: 98.7 fL (ref 80.0–100.0)
Monocytes Absolute: 0.7 10*3/uL (ref 0.1–1.0)
Monocytes Relative: 9 %
Neutro Abs: 5.2 10*3/uL (ref 1.7–7.7)
Neutrophils Relative %: 69 %
Platelets: 225 10*3/uL (ref 150–400)
RBC: 4.72 MIL/uL (ref 4.22–5.81)
RDW: 12.9 % (ref 11.5–15.5)
WBC: 7.5 10*3/uL (ref 4.0–10.5)
nRBC: 0 % (ref 0.0–0.2)

## 2019-04-27 MED ORDER — SODIUM CHLORIDE 0.9% FLUSH
10.0000 mL | Freq: Once | INTRAVENOUS | Status: AC
  Filled 2019-04-27: qty 10

## 2019-04-27 MED ORDER — HEPARIN SOD (PORK) LOCK FLUSH 100 UNIT/ML IV SOLN
500.0000 [IU] | Freq: Once | INTRAVENOUS | Status: AC
  Filled 2019-04-27: qty 5

## 2019-04-27 NOTE — Progress Notes (Signed)
Patient does not offer any problems today.  

## 2019-04-27 NOTE — Telephone Encounter (Signed)
Per Dr.Yu to cancel the previous CT chest wo And schedule a CT chest abd w contrast done in 6 months CT Chest/ABD was scheduled for the same date 11/04/19 as previous CT A detailed message was left on pts vmail making him aware of the scheduled CT location, Date, Time, NPO 4 hour prior and to pick up prep before sched appt date.

## 2019-04-27 NOTE — Progress Notes (Signed)
Hematology/Oncology follow-up note [facility] [phone] [phone]   Patient Care Team: Medicine, Luvenia Heller Family as PCP - General [name], RN as Registered Nurse Noreene Filbert, MD as Referring Physician (Radiation Oncology) Earlie Server, MD as Consulting Physician (Oncology) Benjamine Sprague, DO as Consulting Physician (Surgery) Stitzenberg, Clint Lipps, MD as Referring Physician (Surgical Oncology)  REFERRING PROVIDER: Dr.Thomas CHIEF COMPLAINTS/REASON FOR VISIT:  Follow-up for treatment of rectal cancer, assessment of tolerability of chemotherapy.  HISTORY OF PRESENTING ILLNESS:  Jeffery DERUYTER is a  54 y.o.  male with PMH listed below who was referred to me for evaluation of rectal cancer.  Rectal cancer cT3 N0 M0. 12/25/2017 colonoscopy : revealed anal mass, 5-6 cm from the anal verge.  # CT chest abdomen pelvis 01/16/2018 showed Wall thickening involving the lower rectum, corresponding to the patient's known rectal cancer. No findings specific for metastatic disease. Scattered small bilateral pulmonary nodules measuring up to 3 mm, nonspecific but not considered overly suspicious for metastatic disease.  #01/23/2018 MRI pelvis with and without contrast T3N0  #Cancer treatment TNT neoadjuvant protocol #02/09/2018 to 05/18/2018 FOLFOX q. 14 days x 8 #07/10/2018 finished concurrent chemo with Xeloda/radiation  # Low rectal cancer, s/p total neoadjuvant therapy.chemotherapy regimen with FOLFOX Q2w x 4 months, followed by concurrent Xeloda with RT,  Status post local excision- ypT1 disease,  09/25/2018 rectum local excision at [facility] Pathology showed minute focus of residual viable adenocarcinoma involving submucosa and superficial muscularis propria. Margins are negative. Patient opted to have local excision without proceeding with APR.  ypT1   INTERVAL HISTORY Jeffery Mccormick is a 54 y.o. male who has above history reviewed by me  today presents for rectal cancer cT3 N0 M0, status post total neoadjuvant treatments, status post local excision, ypT1 Patient reports feeling well today.   He was seen by [facility] surgery on 04/05/2019.  Patient had MRI pelvis with and without contrast on 04/05/2019. No evidence of tumor on current exam.  No evidence of metastatic disease in the pelvis. Borderline enlarged left inguinal lymph node probably reactive. Skin thickening and enhancement in the skin of the left medial thigh adjacent to the scrotum. 04/22/2019, patient had colonoscopy done at [facility].  Patient had 3 small polyps in the sigmoid colon resected.  Scar in the distal rectum.  Biopsied.  Colon polyps are tubular adenoma and hyperplastic polyps.  Rectum biopsy showed hyperplastic polyp. Patient denies any difficulties in bowel movement. Appetite is good. He has intermittent neuropathy not taking gabapentin.  Want to be referred to acupuncture clinic again.    Review of Systems  Constitutional: Negative for chills, fever, malaise/fatigue and weight loss.  HENT: Negative for nosebleeds and sore throat.   Eyes: Negative for double vision, photophobia and redness.  Respiratory: Negative for cough, shortness of breath and wheezing.   Cardiovascular: Negative for chest pain, palpitations, orthopnea and leg swelling.  Gastrointestinal: Negative for abdominal pain, blood in stool, diarrhea, nausea and vomiting.  Genitourinary: Negative for dysuria.  Musculoskeletal: Negative for back pain, myalgias and neck pain.  Skin: Negative for itching and rash.  Neurological: Positive for tingling. Negative for dizziness and tremors.  Endo/Heme/Allergies: Negative for environmental allergies. Does not bruise/bleed easily.  Psychiatric/Behavioral: Negative for depression and hallucinations.    MEDICAL HISTORY:  Past Medical History:  Diagnosis Date  . Hemorrhoid prolapse     SURGICAL HISTORY: Past Surgical History:  Procedure Laterality  Date  . COLONOSCOPY WITH PROPOFOL N/A 12/25/2017   Procedure: COLONOSCOPY  WITH PROPOFOL;  Surgeon: Benjamine Sprague, DO;  Location: [place] [facility];  Service: General;  Laterality: N/A;  . HERNIA REPAIR  07/2013   Right Inguinal-Dr. Burt Knack  . PORTACATH PLACEMENT Right 02/03/2018   Procedure: INSERTION PORT-A-CATH;  Surgeon: Benjamine Sprague, DO;  Location: [facility] ORS;  Service: General;  Laterality: Right;    SOCIAL HISTORY: Social History   Socioeconomic History  . Marital status: Married    Spouse name: Not on file  . Number of children: Not on file  . Years of education: Not on file  . Highest education level: Not on file  Occupational History  . Occupation: unemployed  Tobacco Use  . Smoking status: Current Every Day Smoker    Packs/day: 1.00    Years: 30.00    Pack years: 30.00    Types: Cigarettes  . Smokeless tobacco: Never Used  Substance and Sexual Activity  . Alcohol use: Yes    Comment: occasionally  . Drug use: No  . Sexual activity: Not on file  Other Topics Concern  . Not on file  Social History Narrative  . Not on file   Social Determinants of Health   Financial Resource Strain:   . Difficulty of Paying Living Expenses: Not on file  Food Insecurity:   . Worried About Charity fundraiser in the Last Year: Not on file  . Ran Out of Food in the Last Year: Not on file  Transportation Needs:   . Lack of Transportation (Medical): Not on file  . Lack of Transportation (Non-Medical): Not on file  Physical Activity:   . Days of Exercise per Week: Not on file  . Minutes of Exercise per Session: Not on file  Stress:   . Feeling of Stress : Not on file  Social Connections:   . Frequency of Communication with Friends and Family: Not on file  . Frequency of Social Gatherings with Friends and Family: Not on file  . Attends Religious Services: Not on file  . Active Member of Clubs or Organizations: Not on file  . Attends Archivist Meetings: Not on file  .  Marital Status: Not on file  Intimate Partner Violence:   . Fear of Current or Ex-Partner: Not on file  . Emotionally Abused: Not on file  . Physically Abused: Not on file  . Sexually Abused: Not on file    FAMILY HISTORY: Family History  Family history unknown: Yes  patient reports that he does not know any family history.   ALLERGIES:  has No Known Allergies.  MEDICATIONS:  Current Outpatient Medications  Medication Sig Dispense Refill  . lidocaine-prilocaine (EMLA) cream Apply to affected area once 30 g 3  . vitamin B-12 (CYANOCOBALAMIN) 1000 MCG tablet Take 1 tablet (1,000 mcg total) by mouth daily. 90 tablet 1  . clotrimazole-betamethasone (LOTRISONE) cream Apply 1 application topically 2 (two) times daily. (Patient not taking: Reported on 08/10/2018) 30 g 0  . gabapentin (NEURONTIN) 300 MG capsule TAKE 1 CAPSULE BY MOUTH TWICE A DAY (Patient not taking: Reported on 08/10/2018) 180 capsule 1   No current facility-administered medications for this visit.   Facility-Administered Medications Ordered in Other Visits  Medication Dose Route Frequency Provider Last Rate Last Admin  . heparin lock flush 100 unit/mL  500 Units Intravenous Once Earlie Server, MD      . sodium chloride flush (NS) 0.9 % injection 10 mL  10 mL Intravenous Once Earlie Server, MD       RADIOGRAPHIC STUDIES:  I have personally reviewed the radiological images as listed and agreed with the findings in the report. No results found.   PHYSICAL EXAMINATION: ECOG PERFORMANCE STATUS: 0 - Asymptomatic Vitals:   04/27/19 0954  BP: 129/77  Pulse: 77  Resp: 16  Temp: 98.4 F (36.9 C)   Filed Weights   04/27/19 0954  Weight: 199 lb 8 oz (90.5 kg)    Physical Exam Constitutional:      General: He is not in acute distress. HENT:     Head: Normocephalic and atraumatic.  Eyes:     General: No scleral icterus.    Pupils: Pupils are equal, round, and reactive to light.  Cardiovascular:     Rate and Rhythm: Normal  rate and regular rhythm.     Heart sounds: Normal heart sounds.  Pulmonary:     Effort: Pulmonary effort is normal. No respiratory distress.     Breath sounds: No wheezing.  Abdominal:     General: Bowel sounds are normal. There is no distension.     Palpations: Abdomen is soft. There is no mass.     Tenderness: There is no abdominal tenderness.  Musculoskeletal:        General: No deformity. Normal range of motion.     Cervical back: Normal range of motion and neck supple.  Skin:    General: Skin is warm and dry.     Findings: No erythema or rash.  Neurological:     Mental Status: He is alert and oriented to person, place, and time.     Cranial Nerves: No cranial nerve deficit.     Coordination: Coordination normal.  Psychiatric:        Behavior: Behavior normal.        Thought Content: Thought content normal.      LABORATORY DATA:  I have reviewed the data as listed Lab Results  Component Value Date   WBC 7.5 04/27/2019   HGB 15.7 04/27/2019   HCT 46.6 04/27/2019   MCV 98.7 04/27/2019   PLT 225 04/27/2019   Recent Labs    10/13/18 1323 01/19/19 1355 04/27/19 0945  NA 138 139 137  K 3.8 3.8 4.2  CL 105 106 105  CO2 25 22 24   GLUCOSE 113* 123* 101*  BUN 10 11 9   CREATININE 0.78 0.96 0.82  CALCIUM 8.9 9.1 9.4  GFRNONAA >60 >60 >60  GFRAA >60 >60 >60  PROT 7.3 7.5 7.8  ALBUMIN 3.9 4.2 4.3  AST 21 27 22   ALT 23 37 29  ALKPHOS 116 98 94  BILITOT 0.4 0.6 0.6   Iron/TIBC/Ferritin/ %Sat No results found for: IRON, TIBC, FERRITIN, IRONPCTSAT   01/29/2018 baseline CEA 11.6 RADIOGRAPHIC STUDIES: I have personally reviewed the radiological images as listed and agreed with the findings in the report. CT abdomen with contrast 07/23/2018 No evidence of metastatic disease in the abdomen.  CT chest with contrast shows scattered bilateral subcentimeter nodules unchanged from 01/16/2018.  Recommend CT follow-up in 3 months to ensure stability.  ASSESSMENT & PLAN:  1.  Rectal cancer ([place])   2. Abnormal findings on diagnostic imaging of lung   3. Port-A-Cath in place   4. Neuropathy due to chemotherapeutic drug ([place])   5. Lung nodules   Cancer Staging Rectal cancer [facility]) Staging form: Colon and Rectum, AJCC 8th Edition - Clinical stage from 01/29/2018: Stage IIA (cT3, cN0, cM0) - Signed by Earlie Server, MD on 02/01/2018  #Low rectal cancer, s/p total neoadjuvant therapy.chemotherapy regimen  with FOLFOX Q2w x 4 months, followed by concurrent Xeloda with RT,  Status post local excision- ypT1 disease,  Patient recently had MRI pelvis done at [facility] which showed no cancer recurrence or metastatic disease in the pelvis. Continue follow-up with [facility] surgery.  He is scheduled to have another MRI pelvis in 6 months. Labs reviewed and discussed with patient CEA is pending. Plan CT chest abdomen w contrast in 6 months.    #Lung nodules, will repeat CT chest as part of the rectal cancer surveillance. #Grade 1 neuropathy, stable.  Is not interested in continuing gabapentin.  Refer to acupuncture clinic.   #Port-A-Cath in place, continue port flush every 8 weeks x 6.  Repeat CEA, CBC, CMP in 6 months and follow-up in the clinic.  Earlie Server, MD, PhD Hematology Oncology [place] at [facility] 04/27/2019

## 2019-04-28 ENCOUNTER — Telehealth: Payer: Self-pay

## 2019-04-28 LAB — CEA: CEA: 2.7 ng/mL (ref 0.0–4.7)

## 2019-04-28 NOTE — Telephone Encounter (Signed)
Patient requesting to be sent to [place] clinic, he was referred in the past but was not called. Referral sent to [facility] by Tanya Nones, RN. Detailed message left on patient's cell phone to contact [facility] and set up appt.   [place] [facility] # [phone]

## 2019-05-17 ENCOUNTER — Ambulatory Visit
Admission: RE | Admit: 2019-05-17 | Discharge: 2019-05-17 | Disposition: A | Discharge status: Home / Self Care | Payer: Medicaid Other | Source: Ambulatory Visit | Attending: Radiation Oncology | Admitting: Radiation Oncology

## 2019-05-17 DIAGNOSIS — Z85048 Personal history of other malignant neoplasm of rectum, rectosigmoid junction, and anus: Secondary | ICD-10-CM | POA: Insufficient documentation

## 2019-06-22 ENCOUNTER — Inpatient Hospital Stay: Payer: Medicaid Other | Attending: Oncology

## 2019-06-22 ENCOUNTER — Other Ambulatory Visit: Payer: Self-pay

## 2019-06-22 DIAGNOSIS — Z95828 Presence of other vascular implants and grafts: Secondary | ICD-10-CM

## 2019-06-22 DIAGNOSIS — Z452 Encounter for adjustment and management of vascular access device: Secondary | ICD-10-CM | POA: Diagnosis not present

## 2019-06-22 DIAGNOSIS — C20 Malignant neoplasm of rectum: Secondary | ICD-10-CM | POA: Insufficient documentation

## 2019-06-22 MED ORDER — HEPARIN SOD (PORK) LOCK FLUSH 100 UNIT/ML IV SOLN
500.0000 [IU] | Freq: Once | INTRAVENOUS | Status: AC
  Administered 2019-06-22: 500 [IU] via INTRAVENOUS
  Filled 2019-06-22: qty 5

## 2019-06-22 MED ORDER — SODIUM CHLORIDE 0.9% FLUSH
10.0000 mL | Freq: Once | INTRAVENOUS | Status: AC
  Administered 2019-06-22: 12:00:00 10 mL via INTRAVENOUS
  Filled 2019-06-22: qty 10

## 2019-07-24 ENCOUNTER — Ambulatory Visit: Payer: Medicaid Other | Attending: Internal Medicine

## 2019-07-24 DIAGNOSIS — Z23 Encounter for immunization: Secondary | ICD-10-CM

## 2019-07-24 NOTE — Progress Notes (Signed)
   Covid-19 Vaccination Clinic  Name:  Jeffery Mccormick    MRN: [id number] DOB: 1965-08-25  07/24/2019  [name] was observed post Covid-19 immunization for 15 minutes without incident. He was provided with Vaccine Information Sheet and instruction to access the V-Safe system.   [name] was instructed to call 911 with any severe reactions post vaccine: Marland Kitchen Difficulty breathing  . Swelling of face and throat  . A fast heartbeat  . A bad rash all over body  . Dizziness and weakness   Immunizations Administered    Name Date Dose VIS Date Route   Pfizer COVID-19 Vaccine 07/24/2019 10:10 AM 0.3 mL 03/19/2019 Intramuscular   Manufacturer: [employer], [employer]   Lot: [id number]   [place]: [id number]

## 2019-08-10 ENCOUNTER — Other Ambulatory Visit: Payer: Self-pay

## 2019-08-10 ENCOUNTER — Inpatient Hospital Stay: Payer: Medicaid Other | Attending: Oncology

## 2019-08-10 ENCOUNTER — Encounter: Payer: Self-pay | Admitting: Oncology

## 2019-08-10 DIAGNOSIS — Z95828 Presence of other vascular implants and grafts: Secondary | ICD-10-CM

## 2019-08-10 DIAGNOSIS — Z452 Encounter for adjustment and management of vascular access device: Secondary | ICD-10-CM | POA: Diagnosis not present

## 2019-08-10 DIAGNOSIS — C20 Malignant neoplasm of rectum: Secondary | ICD-10-CM | POA: Insufficient documentation

## 2019-08-10 MED ORDER — HEPARIN SOD (PORK) LOCK FLUSH 100 UNIT/ML IV SOLN
500.0000 [IU] | Freq: Once | INTRAVENOUS | Status: AC
  Administered 2019-08-10: 500 [IU] via INTRAVENOUS
  Filled 2019-08-10: qty 5

## 2019-08-10 MED ORDER — HEPARIN SOD (PORK) LOCK FLUSH 100 UNIT/ML IV SOLN
INTRAVENOUS | Status: AC
  Filled 2019-08-10: qty 5

## 2019-08-10 MED ORDER — SODIUM CHLORIDE 0.9% FLUSH
10.0000 mL | Freq: Once | INTRAVENOUS | Status: AC
  Administered 2019-08-10: 15:00:00 10 mL via INTRAVENOUS
  Filled 2019-08-10: qty 10

## 2019-08-17 ENCOUNTER — Ambulatory Visit: Payer: Medicaid Other | Attending: Internal Medicine

## 2019-08-17 DIAGNOSIS — Z23 Encounter for immunization: Secondary | ICD-10-CM

## 2019-08-17 NOTE — Progress Notes (Signed)
   Covid-19 Vaccination Clinic  Name:  Jeffery Mccormick    MRN: [id number] DOB: [birth year]  08/17/2019  [name] was observed post Covid-19 immunization for 15 minutes without incident. He was provided with Vaccine Information Sheet and instruction to access the V-Safe system.   [name] was instructed to call 911 with any severe reactions post vaccine: Marland Kitchen Difficulty breathing  . Swelling of face and throat  . A fast heartbeat  . A bad rash all over body  . Dizziness and weakness   Immunizations Administered    Name Date Dose VIS Date Route   Pfizer COVID-19 Vaccine 08/17/2019  3:22 PM 0.3 mL 06/02/2018 Intramuscular   Manufacturer: [place]   Lot: [id number]   [place]: [id number]

## 2019-08-18 ENCOUNTER — Telehealth: Payer: Self-pay

## 2019-08-18 NOTE — Telephone Encounter (Signed)
Pt will have CT chest w/, CT abd w/ & MRI pelvis at [facility] on June 28.  Please cancel CT scheduled with [place] in July. Keep follow up appt as is (July 28). Already sent pt a Mychart message to make him aware.  Thanks .

## 2019-08-18 NOTE — Telephone Encounter (Signed)
Done...  CT abd sched on 11/01/19 has been cx as requested.

## 2019-10-05 ENCOUNTER — Inpatient Hospital Stay: Payer: Medicaid Other

## 2019-11-01 ENCOUNTER — Ambulatory Visit: Payer: Medicaid Other

## 2019-11-03 ENCOUNTER — Inpatient Hospital Stay (HOSPITAL_BASED_OUTPATIENT_CLINIC_OR_DEPARTMENT_OTHER): Payer: Medicaid Other | Admitting: Oncology

## 2019-11-03 ENCOUNTER — Encounter: Payer: Self-pay | Admitting: Oncology

## 2019-11-03 ENCOUNTER — Other Ambulatory Visit: Payer: Self-pay

## 2019-11-03 ENCOUNTER — Inpatient Hospital Stay: Payer: Medicaid Other | Attending: Oncology

## 2019-11-03 VITALS — BP 169/97 | HR 76 | Temp 96.9°F | Resp 18 | Wt 209.9 lb

## 2019-11-03 DIAGNOSIS — G620 Drug-induced polyneuropathy: Secondary | ICD-10-CM | POA: Diagnosis not present

## 2019-11-03 DIAGNOSIS — D751 Secondary polycythemia: Secondary | ICD-10-CM

## 2019-11-03 DIAGNOSIS — T451X5A Adverse effect of antineoplastic and immunosuppressive drugs, initial encounter: Secondary | ICD-10-CM

## 2019-11-03 DIAGNOSIS — R918 Other nonspecific abnormal finding of lung field: Secondary | ICD-10-CM | POA: Insufficient documentation

## 2019-11-03 DIAGNOSIS — Z95828 Presence of other vascular implants and grafts: Secondary | ICD-10-CM | POA: Diagnosis not present

## 2019-11-03 DIAGNOSIS — C20 Malignant neoplasm of rectum: Secondary | ICD-10-CM

## 2019-11-03 DIAGNOSIS — F17210 Nicotine dependence, cigarettes, uncomplicated: Secondary | ICD-10-CM | POA: Insufficient documentation

## 2019-11-03 LAB — CBC WITH DIFFERENTIAL/PLATELET
Abs Immature Granulocytes: 0.02 10*3/uL (ref 0.00–0.07)
Basophils Absolute: 0.1 10*3/uL (ref 0.0–0.1)
Basophils Relative: 1 %
Eosinophils Absolute: 0.1 10*3/uL (ref 0.0–0.5)
Eosinophils Relative: 2 %
HCT: 47.8 % (ref 39.0–52.0)
Hemoglobin: 17.2 g/dL — ABNORMAL HIGH (ref 13.0–17.0)
Immature Granulocytes: 0 %
Lymphocytes Relative: 18 %
Lymphs Abs: 1.4 10*3/uL (ref 0.7–4.0)
MCH: 34.4 pg — ABNORMAL HIGH (ref 26.0–34.0)
MCHC: 36 g/dL (ref 30.0–36.0)
MCV: 95.6 fL (ref 80.0–100.0)
Monocytes Absolute: 0.7 10*3/uL (ref 0.1–1.0)
Monocytes Relative: 9 %
Neutro Abs: 5.5 10*3/uL (ref 1.7–7.7)
Neutrophils Relative %: 70 %
Platelets: 204 10*3/uL (ref 150–400)
RBC: 5 MIL/uL (ref 4.22–5.81)
RDW: 13.1 % (ref 11.5–15.5)
WBC: 7.8 10*3/uL (ref 4.0–10.5)
nRBC: 0 % (ref 0.0–0.2)

## 2019-11-03 LAB — COMPREHENSIVE METABOLIC PANEL
ALT: 36 U/L (ref 0–44)
AST: 27 U/L (ref 15–41)
Albumin: 4.5 g/dL (ref 3.5–5.0)
Alkaline Phosphatase: 98 U/L (ref 38–126)
Anion gap: 6 (ref 5–15)
BUN: 9 mg/dL (ref 6–20)
CO2: 24 mmol/L (ref 22–32)
Calcium: 9.2 mg/dL (ref 8.9–10.3)
Chloride: 106 mmol/L (ref 98–111)
Creatinine, Ser: 0.9 mg/dL (ref 0.61–1.24)
GFR calc Af Amer: >60 mL/min (ref >60)
GFR calc non Af Amer: >60 mL/min (ref >60)
Glucose, Bld: 110 mg/dL — ABNORMAL HIGH (ref 70–99)
Potassium: 4.1 mmol/L (ref 3.5–5.1)
Sodium: 136 mmol/L (ref 135–145)
Total Bilirubin: 0.6 mg/dL (ref 0.3–1.2)
Total Protein: 7.8 g/dL (ref 6.5–8.1)

## 2019-11-03 NOTE — Progress Notes (Signed)
She is Hematology/Oncology follow-up note [facility] Telephone:([phone] Fax:(336) [phone]   Patient Care Team: Medicine, Luvenia Heller Family as PCP - General [name], RN as Registered Nurse Noreene Filbert, MD as Referring Physician (Radiation Oncology) Earlie Server, MD as Consulting Physician (Oncology) Benjamine Sprague, DO as Consulting Physician (Surgery) Stitzenberg, Clint Lipps, MD as Referring Physician (Surgical Oncology)  REFERRING PROVIDER: Dr.Thomas CHIEF COMPLAINTS/REASON FOR VISIT:  Follow-up for treatment of rectal cancer, assessment of tolerability of chemotherapy.  HISTORY OF PRESENTING ILLNESS:  Jeffery Mccormick is a  54 y.o.  male with PMH listed below who was referred to me for evaluation of rectal cancer.  Rectal cancer cT3 N0 M0. 12/25/2017 colonoscopy : revealed anal mass, 5-6 cm from the anal verge.  # CT chest abdomen pelvis 01/16/2018 showed Wall thickening involving the lower rectum, corresponding to the patient's known rectal cancer. No findings specific for metastatic disease. Scattered small bilateral pulmonary nodules measuring up to 3 mm, nonspecific but not considered overly suspicious for metastatic disease.  #01/23/2018 MRI pelvis with and without contrast T3N0  #Cancer treatment TNT neoadjuvant protocol #02/09/2018 to 05/18/2018 FOLFOX q. 14 days x 8 #07/10/2018 finished concurrent chemo with Xeloda/radiation  # Low rectal cancer, s/p total neoadjuvant therapy.chemotherapy regimen with FOLFOX Q2w x 4 months, followed by concurrent Xeloda with RT,  Status post local excision- ypT1 disease,  09/25/2018 rectum local excision at [facility] Pathology showed minute focus of residual viable adenocarcinoma involving submucosa and superficial muscularis propria. Margins are negative. Patient opted to have local excision without proceeding with APR.  ypT1  #[facility] 04/05/2019 MRI pelvis with and without contrast . No evidence of tumor on  current exam.  No evidence of metastatic disease in the pelvis. 04/22/2019, patient had colonoscopy done at [facility].  Patient had 3 small polyps in the sigmoid colon resected.  Scar in the distal rectum.  Biopsied.  Colon polyps are tubular adenoma and hyperplastic polyps.  Rectum biopsy showed hyperplastic polyp.   INTERVAL HISTORY Jeffery Mccormick is a 54 y.o. male who has above history reviewed by me today presents for rectal cancer cT3 N0 M0, status post total neoadjuvant treatments, status post local excision, ypT1 Patient reports feeling well today.   Patient continues to follow-up with [facility] surgery.  Was last seen in June 2021. 10/04/2019, CT chest abdomen pelvis showed bilateral scattered subcentimeter nodules unchanged from 01/16/2018.  Indeterminate but favored benign etiology given the stability.  Attention on follow-up.  No new nodules. CT abdomen pelvis showed no evidence of metastatic disease in the abdomen. Patient reports feeling well.  Good appetite.  Denies any black or bloody stool.  No abdominal pain,.  Patient reports worsening of lower extremity neuropathy.  He does not want to take gabapentin due to the concern of potential side effects Review of Systems  Constitutional: Negative for chills, fever, malaise/fatigue and weight loss.  HENT: Negative for nosebleeds and sore throat.   Eyes: Negative for double vision, photophobia and redness.  Respiratory: Negative for cough, shortness of breath and wheezing.   Cardiovascular: Negative for chest pain, palpitations, orthopnea and leg swelling.  Gastrointestinal: Negative for abdominal pain, blood in stool, diarrhea, nausea and vomiting.  Genitourinary: Negative for dysuria.  Musculoskeletal: Negative for back pain, myalgias and neck pain.  Skin: Negative for itching and rash.  Neurological: Positive for tingling. Negative for dizziness and tremors.  Endo/Heme/Allergies: Negative for environmental allergies. Does not bruise/bleed  easily.  Psychiatric/Behavioral: Negative for depression and hallucinations.  MEDICAL HISTORY:  Past Medical History:  Diagnosis Date  . Hemorrhoid prolapse     SURGICAL HISTORY: Past Surgical History:  Procedure Laterality Date  . COLONOSCOPY WITH PROPOFOL N/A 12/25/2017   Procedure: COLONOSCOPY WITH PROPOFOL;  Surgeon: Benjamine Sprague, DO;  Location: [place] [facility];  Service: General;  Laterality: N/A;  . HERNIA REPAIR  07/2013   Right Inguinal-Dr. Burt Knack  . PORTACATH PLACEMENT Right 02/03/2018   Procedure: INSERTION PORT-A-CATH;  Surgeon: Benjamine Sprague, DO;  Location: [facility] ORS;  Service: General;  Laterality: Right;    SOCIAL HISTORY: Social History   Socioeconomic History  . Marital status: Married    Spouse name: Not on file  . Number of children: Not on file  . Years of education: Not on file  . Highest education level: Not on file  Occupational History  . Occupation: unemployed  Tobacco Use  . Smoking status: Current Every Day Smoker    Packs/day: 1.00    Years: 30.00    Pack years: 30.00    Types: Cigarettes  . Smokeless tobacco: Never Used  Vaping Use  . Vaping Use: Never used  Substance and Sexual Activity  . Alcohol use: Yes    Comment: occasionally  . Drug use: No  . Sexual activity: Not on file  Other Topics Concern  . Not on file  Social History Narrative  . Not on file   Social Determinants of Health   Financial Resource Strain:   . Difficulty of Paying Living Expenses:   Food Insecurity:   . Worried About Charity fundraiser in the Last Year:   . Arboriculturist in the Last Year:   Transportation Needs:   . Film/video editor (Medical):   Marland Kitchen Lack of Transportation (Non-Medical):   Physical Activity:   . Days of Exercise per Week:   . Minutes of Exercise per Session:   Stress:   . Feeling of Stress :   Social Connections:   . Frequency of Communication with Friends and Family:   . Frequency of Social Gatherings with Friends and  Family:   . Attends Religious Services:   . Active Member of Clubs or Organizations:   . Attends Archivist Meetings:   Marland Kitchen Marital Status:   Intimate Partner Violence:   . Fear of Current or Ex-Partner:   . Emotionally Abused:   Marland Kitchen Physically Abused:   . Sexually Abused:     FAMILY HISTORY: Family History  Family history unknown: Yes  patient reports that he does not know any family history.   ALLERGIES:  has No Known Allergies.  MEDICATIONS:  No current outpatient medications on file.   No current facility-administered medications for this visit.   Facility-Administered Medications Ordered in Other Visits  Medication Dose Route Frequency Provider Last Rate Last Admin  . heparin lock flush 100 unit/mL  500 Units Intravenous Once Earlie Server, MD      . sodium chloride flush (NS) 0.9 % injection 10 mL  10 mL Intravenous Once Earlie Server, MD       RADIOGRAPHIC STUDIES: I have personally reviewed the radiological images as listed and agreed with the findings in the report. No results found.   PHYSICAL EXAMINATION: ECOG PERFORMANCE STATUS: 0 - Asymptomatic Vitals:   11/03/19 1006  BP: (!) 169/97  Pulse: 76  Resp: 18  Temp: (!) 96.9 F (36.1 C)   Filed Weights   11/03/19 1006  Weight: (!) 209 lb 14.4 oz (95.2 kg)  Physical Exam Constitutional:      General: He is not in acute distress. HENT:     Head: Normocephalic and atraumatic.  Eyes:     General: No scleral icterus.    Pupils: Pupils are equal, round, and reactive to light.  Cardiovascular:     Rate and Rhythm: Normal rate and regular rhythm.     Heart sounds: Normal heart sounds.  Pulmonary:     Effort: Pulmonary effort is normal. No respiratory distress.     Breath sounds: No wheezing.  Abdominal:     General: Bowel sounds are normal. There is no distension.     Palpations: Abdomen is soft. There is no mass.     Tenderness: There is no abdominal tenderness.  Musculoskeletal:        General: No  deformity. Normal range of motion.     Cervical back: Normal range of motion and neck supple.  Skin:    General: Skin is warm and dry.     Findings: No erythema or rash.  Neurological:     Mental Status: He is alert and oriented to person, place, and time. Mental status is at baseline.     Cranial Nerves: No cranial nerve deficit.     Coordination: Coordination normal.  Psychiatric:        Mood and Affect: Mood normal.        Thought Content: Thought content normal.      LABORATORY DATA:  I have reviewed the data as listed Lab Results  Component Value Date   WBC 7.8 11/03/2019   HGB 17.2 (H) 11/03/2019   HCT 47.8 11/03/2019   MCV 95.6 11/03/2019   PLT 204 11/03/2019   Recent Labs    01/19/19 1355 04/27/19 0945 11/03/19 0940  NA 139 137 136  K 3.8 4.2 4.1  CL 106 105 106  CO2 22 24 24   GLUCOSE 123* 101* 110*  BUN 11 9 9   CREATININE 0.96 0.82 0.90  CALCIUM 9.1 9.4 9.2  GFRNONAA >60 >60 >60  GFRAA >60 >60 >60  PROT 7.5 7.8 7.8  ALBUMIN 4.2 4.3 4.5  AST 27 22 27   ALT 37 29 36  ALKPHOS 98 94 98  BILITOT 0.6 0.6 0.6   Iron/TIBC/Ferritin/ %Sat No results found for: IRON, TIBC, FERRITIN, IRONPCTSAT   01/29/2018 baseline CEA 11.6 RADIOGRAPHIC STUDIES: I have personally reviewed the radiological images as listed and agreed with the findings in the report. CT abdomen with contrast 07/23/2018 No evidence of metastatic disease in the abdomen.  CT chest with contrast shows scattered bilateral subcentimeter nodules unchanged from 01/16/2018.  Recommend CT follow-up in 3 months to ensure stability.  ASSESSMENT & PLAN:  1. 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 Port-A-Cath in place   4. Lung nodules   5. Erythrocytosis   Cancer Staging Rectal cancer [facility]) Staging form: Colon and Rectum, AJCC 8th Edition - Clinical stage from 01/29/2018: Stage IIA (cT3, cN0, cM0) - Signed by Earlie Server, MD on 02/01/2018 - Pathologic stage from  09/23/2018: Stage Unknown (ypT1, pNX, cM0) - Signed by Earlie Server, MD on 04/27/2019  #Low rectal cancer, s/p total neoadjuvant therapy.chemotherapy regimen with FOLFOX Q2w x 4 months, followed by concurrent Xeloda with RT,  Status post local excision- ypT1 disease,  Labs reviewed and discussed with patient. Reports from [facility] CT chest abdomen pelvis was.  Reviewed. No evidence of disease recurrence or metastatic disease. Continue labs, physical examination every  3 to 6 months. Imaging surveillance every 6 months.  Looks like patient sees [facility] surgery every 6 months and has MRI scheduled in January 2022. I will see patient in 3 to 4 months and then every 6 months.  Therefore patient sees as an Psychologist, counselling alternatively. I will defer to [facility] surgery for scheduling surveillance images.   #Lung nodules, stable on recent CT scans.  Attention on follow-up. #Grade 1 neuropathy, stable.  Patient is not interested in pharmacological intervention due to concern of side effects. I will refer patient to establish care with acupuncture clinic  #Port-A-Cath in place, continue port flush every 8 weeks x 6. #Erythrocytosis, this is new to patient.  Hemoglobin 17.2, 7.8. Patient smokes occasionally.  We discussed about smoking cessation.  Will monitor hemoglobin. Repeat CEA, CBC, CMP in 3 months and follow-up in the clinic.  Earlie Server, MD, PhD Hematology Oncology [place] at [facility] 11/03/2019

## 2019-11-03 NOTE — Progress Notes (Signed)
Neuropathy of hands has improved but still has neuropathy of feet.

## 2019-11-04 ENCOUNTER — Encounter: Payer: Self-pay | Admitting: *Deleted

## 2019-11-04 LAB — CEA: CEA: 3 ng/mL (ref 0.0–4.7)

## 2019-11-04 NOTE — Progress Notes (Signed)
Left message for patient that I had emailed the approval for 6 free acupuncture sessions to [place].

## 2019-11-30 ENCOUNTER — Other Ambulatory Visit: Payer: Self-pay

## 2019-11-30 ENCOUNTER — Inpatient Hospital Stay: Payer: Medicaid Other | Attending: Oncology

## 2019-11-30 DIAGNOSIS — C20 Malignant neoplasm of rectum: Secondary | ICD-10-CM | POA: Diagnosis present

## 2019-11-30 DIAGNOSIS — Z452 Encounter for adjustment and management of vascular access device: Secondary | ICD-10-CM | POA: Diagnosis present

## 2019-11-30 DIAGNOSIS — Z95828 Presence of other vascular implants and grafts: Secondary | ICD-10-CM

## 2019-11-30 MED ORDER — HEPARIN SOD (PORK) LOCK FLUSH 100 UNIT/ML IV SOLN
INTRAVENOUS | Status: AC
  Filled 2019-11-30: qty 5

## 2019-11-30 MED ORDER — HEPARIN SOD (PORK) LOCK FLUSH 100 UNIT/ML IV SOLN
500.0000 [IU] | Freq: Once | INTRAVENOUS | Status: AC
  Administered 2019-11-30: 500 [IU] via INTRAVENOUS
  Filled 2019-11-30: qty 5

## 2019-11-30 MED ORDER — SODIUM CHLORIDE 0.9% FLUSH
10.0000 mL | Freq: Once | INTRAVENOUS | Status: AC
  Administered 2019-11-30: 10 mL via INTRAVENOUS
  Filled 2019-11-30: qty 10

## 2020-01-25 ENCOUNTER — Inpatient Hospital Stay: Payer: Medicaid Other

## 2020-02-03 ENCOUNTER — Inpatient Hospital Stay (HOSPITAL_BASED_OUTPATIENT_CLINIC_OR_DEPARTMENT_OTHER): Payer: Medicaid Other | Admitting: Oncology

## 2020-02-03 ENCOUNTER — Other Ambulatory Visit: Payer: Self-pay

## 2020-02-03 ENCOUNTER — Inpatient Hospital Stay: Payer: Medicaid Other | Attending: Oncology

## 2020-02-03 ENCOUNTER — Encounter: Payer: Self-pay | Admitting: Oncology

## 2020-02-03 VITALS — BP 141/87 | HR 73 | Temp 97.4°F | Resp 18 | Wt 208.7 lb

## 2020-02-03 DIAGNOSIS — Z95828 Presence of other vascular implants and grafts: Secondary | ICD-10-CM | POA: Diagnosis not present

## 2020-02-03 DIAGNOSIS — F17210 Nicotine dependence, cigarettes, uncomplicated: Secondary | ICD-10-CM | POA: Diagnosis not present

## 2020-02-03 DIAGNOSIS — R918 Other nonspecific abnormal finding of lung field: Secondary | ICD-10-CM | POA: Insufficient documentation

## 2020-02-03 DIAGNOSIS — T451X5A Adverse effect of antineoplastic and immunosuppressive drugs, initial encounter: Secondary | ICD-10-CM | POA: Insufficient documentation

## 2020-02-03 DIAGNOSIS — C20 Malignant neoplasm of rectum: Secondary | ICD-10-CM

## 2020-02-03 DIAGNOSIS — G620 Drug-induced polyneuropathy: Secondary | ICD-10-CM

## 2020-02-03 DIAGNOSIS — D751 Secondary polycythemia: Secondary | ICD-10-CM | POA: Diagnosis not present

## 2020-02-03 LAB — CBC WITH DIFFERENTIAL/PLATELET
Abs Immature Granulocytes: 0.02 10*3/uL (ref 0.00–0.07)
Basophils Absolute: 0.1 10*3/uL (ref 0.0–0.1)
Basophils Relative: 1 %
Eosinophils Absolute: 0.2 10*3/uL (ref 0.0–0.5)
Eosinophils Relative: 2 %
HCT: 45.3 % (ref 39.0–52.0)
Hemoglobin: 16 g/dL (ref 13.0–17.0)
Immature Granulocytes: 0 %
Lymphocytes Relative: 22 %
Lymphs Abs: 1.6 10*3/uL (ref 0.7–4.0)
MCH: 33.8 pg (ref 26.0–34.0)
MCHC: 35.3 g/dL (ref 30.0–36.0)
MCV: 95.6 fL (ref 80.0–100.0)
Monocytes Absolute: 0.7 10*3/uL (ref 0.1–1.0)
Monocytes Relative: 10 %
Neutro Abs: 4.8 10*3/uL (ref 1.7–7.7)
Neutrophils Relative %: 65 %
Platelets: 214 10*3/uL (ref 150–400)
RBC: 4.74 MIL/uL (ref 4.22–5.81)
RDW: 13.6 % (ref 11.5–15.5)
WBC: 7.3 10*3/uL (ref 4.0–10.5)
nRBC: 0 % (ref 0.0–0.2)

## 2020-02-03 LAB — COMPREHENSIVE METABOLIC PANEL
ALT: 31 U/L (ref 0–44)
AST: 25 U/L (ref 15–41)
Albumin: 4.4 g/dL (ref 3.5–5.0)
Alkaline Phosphatase: 87 U/L (ref 38–126)
Anion gap: 10 (ref 5–15)
BUN: 11 mg/dL (ref 6–20)
CO2: 24 mmol/L (ref 22–32)
Calcium: 9.1 mg/dL (ref 8.9–10.3)
Chloride: 104 mmol/L (ref 98–111)
Creatinine, Ser: 0.93 mg/dL (ref 0.61–1.24)
GFR, Estimated: >60 mL/min (ref >60)
Glucose, Bld: 110 mg/dL — ABNORMAL HIGH (ref 70–99)
Potassium: 3.9 mmol/L (ref 3.5–5.1)
Sodium: 138 mmol/L (ref 135–145)
Total Bilirubin: 0.7 mg/dL (ref 0.3–1.2)
Total Protein: 7.9 g/dL (ref 6.5–8.1)

## 2020-02-03 MED ORDER — SODIUM CHLORIDE 0.9% FLUSH
10.0000 mL | Freq: Once | INTRAVENOUS | Status: AC
  Administered 2020-02-03: 10 mL via INTRAVENOUS
  Filled 2020-02-03: qty 10

## 2020-02-03 MED ORDER — HEPARIN SOD (PORK) LOCK FLUSH 100 UNIT/ML IV SOLN
500.0000 [IU] | Freq: Once | INTRAVENOUS | Status: AC
  Administered 2020-02-03: 500 [IU] via INTRAVENOUS
  Filled 2020-02-03: qty 5

## 2020-02-03 MED ORDER — HEPARIN SOD (PORK) LOCK FLUSH 100 UNIT/ML IV SOLN
INTRAVENOUS | Status: AC
  Filled 2020-02-03: qty 5

## 2020-02-03 NOTE — Progress Notes (Signed)
She is Hematology/Oncology follow-up note [facility] Telephone:(336) [phone] Fax:(336) [phone]   Patient Care Team: Medicine, Luvenia Heller Family as PCP - General [name], RN as Registered Nurse Noreene Filbert, MD as Referring Physician (Radiation Oncology) Earlie Server, MD as Consulting Physician (Oncology) Benjamine Sprague, DO as Consulting Physician (Surgery) Stitzenberg, Clint Lipps, MD as Referring Physician (Surgical Oncology)  REFERRING PROVIDER: Dr.Thomas CHIEF COMPLAINTS/REASON FOR VISIT:  Follow-up for treatment of rectal cancer, assessment of tolerability of chemotherapy.  HISTORY OF PRESENTING ILLNESS:  Jeffery Mccormick is a  54 y.o.  male with PMH listed below who was referred to me for evaluation of rectal cancer.  Rectal cancer cT3 N0 M0. 12/25/2017 colonoscopy : revealed anal mass, 5-6 cm from the anal verge.  # CT chest abdomen pelvis 01/16/2018 showed Wall thickening involving the lower rectum, corresponding to the patient's known rectal cancer. No findings specific for metastatic disease. Scattered small bilateral pulmonary nodules measuring up to 3 mm, nonspecific but not considered overly suspicious for metastatic disease.  #01/23/2018 MRI pelvis with and without contrast T3N0  #Cancer treatment TNT neoadjuvant protocol #02/09/2018 to 05/18/2018 FOLFOX q. 14 days x 8 #07/10/2018 finished concurrent chemo with Xeloda/radiation  # Low rectal cancer, s/p total neoadjuvant therapy.chemotherapy regimen with FOLFOX Q2w x 4 months, followed by concurrent Xeloda with RT,  Status post local excision- ypT1 disease,  09/25/2018 rectum local excision at [facility] Pathology showed minute focus of residual viable adenocarcinoma involving submucosa and superficial muscularis propria. Margins are negative. Patient opted to have local excision without proceeding with APR.  ypT1  #[facility] 04/05/2019 MRI pelvis with and without contrast . No evidence of tumor on  current exam.  No evidence of metastatic disease in the pelvis. 04/22/2019, patient had colonoscopy done at [facility].  Patient had 3 small polyps in the sigmoid colon resected.  Scar in the distal rectum.  Biopsied.  Colon polyps are tubular adenoma and hyperplastic polyps.  Rectum biopsy showed hyperplastic polyp.   INTERVAL HISTORY Jeffery Mccormick is a 54 y.o. male who has above history reviewed by me today presents for rectal cancer cT3 N0 M0, status post total neoadjuvant treatments, status post local excision, ypT1 Patient reports feeling well today.   Patient continues to follow-up with [facility] surgery.  Was last seen in June 2021. 10/04/2019, CT chest abdomen pelvis showed bilateral scattered subcentimeter nodules unchanged from 01/16/2018.  Indeterminate but favored benign etiology given the stability.  Attention on follow-up.  No new nodules. CT abdomen pelvis showed no evidence of metastatic disease in the abdomen. He reports feeling well.  No new complaints. Continues to have chronic intermittent numbness and tingling of his finger tips and toes.  Not worse or better. He has tried acupuncture which did not help.  No change of bowel habits, no blood in stool.   Review of Systems  Constitutional: Negative for chills, fever, malaise/fatigue and weight loss.  HENT: Negative for nosebleeds and sore throat.   Eyes: Negative for double vision, photophobia and redness.  Respiratory: Negative for cough, shortness of breath and wheezing.   Cardiovascular: Negative for chest pain, palpitations, orthopnea and leg swelling.  Gastrointestinal: Negative for abdominal pain, blood in stool, diarrhea, nausea and vomiting.  Genitourinary: Negative for dysuria.  Musculoskeletal: Negative for back pain, myalgias and neck pain.  Skin: Negative for itching and rash.  Neurological: Positive for tingling. Negative for dizziness and tremors.  Endo/Heme/Allergies: Negative for environmental allergies. Does not  bruise/bleed easily.  Psychiatric/Behavioral: Negative for  depression and hallucinations.    MEDICAL HISTORY:  Past Medical History:  Diagnosis Date  . Hemorrhoid prolapse     SURGICAL HISTORY: Past Surgical History:  Procedure Laterality Date  . COLONOSCOPY WITH PROPOFOL N/A 12/25/2017   Procedure: COLONOSCOPY WITH PROPOFOL;  Surgeon: Benjamine Sprague, DO;  Location: [place] [facility];  Service: General;  Laterality: N/A;  . HERNIA REPAIR  07/2013   Right Inguinal-Dr. Burt Knack  . PORTACATH PLACEMENT Right 02/03/2018   Procedure: INSERTION PORT-A-CATH;  Surgeon: Benjamine Sprague, DO;  Location: [facility] ORS;  Service: General;  Laterality: Right;    SOCIAL HISTORY: Social History   Socioeconomic History  . Marital status: Married    Spouse name: Not on file  . Number of children: Not on file  . Years of education: Not on file  . Highest education level: Not on file  Occupational History  . Occupation: unemployed  Tobacco Use  . Smoking status: Current Every Day Smoker    Packs/day: 1.00    Years: 30.00    Pack years: 30.00    Types: Cigarettes  . Smokeless tobacco: Never Used  Vaping Use  . Vaping Use: Never used  Substance and Sexual Activity  . Alcohol use: Yes    Comment: occasionally  . Drug use: No  . Sexual activity: Not on file  Other Topics Concern  . Not on file  Social History Narrative  . Not on file   Social Determinants of Health   Financial Resource Strain:   . Difficulty of Paying Living Expenses: Not on file  Food Insecurity:   . Worried About Charity fundraiser in the Last Year: Not on file  . Ran Out of Food in the Last Year: Not on file  Transportation Needs:   . Lack of Transportation (Medical): Not on file  . Lack of Transportation (Non-Medical): Not on file  Physical Activity:   . Days of Exercise per Week: Not on file  . Minutes of Exercise per Session: Not on file  Stress:   . Feeling of Stress : Not on file  Social Connections:   . Frequency  of Communication with Friends and Family: Not on file  . Frequency of Social Gatherings with Friends and Family: Not on file  . Attends Religious Services: Not on file  . Active Member of Clubs or Organizations: Not on file  . Attends Archivist Meetings: Not on file  . Marital Status: Not on file  Intimate Partner Violence:   . Fear of Current or Ex-Partner: Not on file  . Emotionally Abused: Not on file  . Physically Abused: Not on file  . Sexually Abused: Not on file    FAMILY HISTORY: Family History  Family history unknown: Yes  patient reports that he does not know any family history.   ALLERGIES:  has No Known Allergies.  MEDICATIONS:  No current outpatient medications on file.   No current facility-administered medications for this visit.   Facility-Administered Medications Ordered in Other Visits  Medication Dose Route Frequency Provider Last Rate Last Admin  . heparin lock flush 100 unit/mL  500 Units Intravenous Once Earlie Server, MD      . sodium chloride flush (NS) 0.9 % injection 10 mL  10 mL Intravenous Once Earlie Server, MD       RADIOGRAPHIC STUDIES: I have personally reviewed the radiological images as listed and agreed with the findings in the report. No results found.   PHYSICAL EXAMINATION: ECOG PERFORMANCE STATUS: 0 -  Asymptomatic Vitals:   02/03/20 1024  BP: (!) 141/87  Pulse: 73  Resp: 18  Temp: (!) 97.4 F (36.3 C)   Filed Weights   02/03/20 1024  Weight: 208 lb 11.2 oz (94.7 kg)    Physical Exam Constitutional:      General: He is not in acute distress. HENT:     Head: Normocephalic and atraumatic.  Eyes:     General: No scleral icterus.    Pupils: Pupils are equal, round, and reactive to light.  Cardiovascular:     Rate and Rhythm: Normal rate and regular rhythm.     Heart sounds: Normal heart sounds.  Pulmonary:     Effort: Pulmonary effort is normal. No respiratory distress.     Breath sounds: No wheezing.  Abdominal:      General: Bowel sounds are normal. There is no distension.     Palpations: Abdomen is soft. There is no mass.     Tenderness: There is no abdominal tenderness.  Musculoskeletal:        General: No deformity. Normal range of motion.     Cervical back: Normal range of motion and neck supple.  Skin:    General: Skin is warm and dry.     Findings: No erythema or rash.  Neurological:     Mental Status: He is alert and oriented to person, place, and time. Mental status is at baseline.     Cranial Nerves: No cranial nerve deficit.     Coordination: Coordination normal.  Psychiatric:        Mood and Affect: Mood normal.        Thought Content: Thought content normal.      LABORATORY DATA:  I have reviewed the data as listed Lab Results  Component Value Date   WBC 7.3 02/03/2020   HGB 16.0 02/03/2020   HCT 45.3 02/03/2020   MCV 95.6 02/03/2020   PLT 214 02/03/2020   Recent Labs    04/27/19 0945 11/03/19 0940 02/03/20 0944  NA 137 136 138  K 4.2 4.1 3.9  CL 105 106 104  CO2 24 24 24   GLUCOSE 101* 110* 110*  BUN 9 9 11   CREATININE 0.82 0.90 0.93  CALCIUM 9.4 9.2 9.1  GFRNONAA >60 >60 >60  GFRAA >60 >60  --   PROT 7.8 7.8 7.9  ALBUMIN 4.3 4.5 4.4  AST 22 27 25   ALT 29 36 31  ALKPHOS 94 98 87  BILITOT 0.6 0.6 0.7   Iron/TIBC/Ferritin/ %Sat No results found for: IRON, TIBC, FERRITIN, IRONPCTSAT   01/29/2018 baseline CEA 11.6 RADIOGRAPHIC STUDIES: I have personally reviewed the radiological images as listed and agreed with the findings in the report. CT abdomen with contrast 07/23/2018 No evidence of metastatic disease in the abdomen.  CT chest with contrast shows scattered bilateral subcentimeter nodules unchanged from 01/16/2018.  Recommend CT follow-up in 3 months to ensure stability.  ASSESSMENT & PLAN:  1. Rectal cancer ([place])   2. Neuropathy due to chemotherapeutic drug ([place])   3. Port-A-Cath in place   4. Erythrocytosis   Cancer Staging Rectal cancer  [facility]) Staging form: Colon and Rectum, AJCC 8th Edition - Clinical stage from 01/29/2018: Stage IIA (cT3, cN0, cM0) - Signed by Earlie Server, MD on 02/01/2018 - Pathologic stage from 09/23/2018: Stage Unknown (ypT1, pNX, cM0) - Signed by Earlie Server, MD on 04/27/2019  #Low rectal cancer, s/p total neoadjuvant therapy.chemotherapy regimen with FOLFOX Q2w x 4 months, followed by concurrent Xeloda with  RT, Status post local excision- ypT1 disease,  Labs are reviewed and discussed with patient. Stable counts. CEA is pending.  His surgeon at [facility] is currently in charge of ordering images every 6 months. Next image in January 2021.   Continue labs, physical examination every 3 to 6 months. Imaging surveillance every 6 months.  #Lung nodules, stable on recent CT scans.  Attention on follow-up. #Grade 1 neuropathy, stable.  He is not interested in taking medication for neuropathy.  #Port-A-Cath in place, continue port flush every 8 weeks x 6. #Erythrocytosis, smoke cessation was discussed.  Hb has improved.  Repeat CEA, CBC, CMP in 4 months and follow-up in the clinic.  Earlie Server, MD, PhD Hematology Oncology [place] at [facility] 02/03/2020

## 2020-02-03 NOTE — Progress Notes (Signed)
Pt here for follow up. No new concerns voiced.   

## 2020-02-04 LAB — CARBON MONOXIDE, BLOOD (PERFORMED AT REF LAB): Carbon Monoxide, Blood: 9.1 % — ABNORMAL HIGH (ref 0.0–3.6)

## 2020-02-04 LAB — CEA: CEA: 2.6 ng/mL (ref 0.0–4.7)

## 2020-03-21 ENCOUNTER — Inpatient Hospital Stay: Payer: Medicaid Other | Attending: Oncology

## 2020-03-21 DIAGNOSIS — C20 Malignant neoplasm of rectum: Secondary | ICD-10-CM | POA: Diagnosis present

## 2020-03-21 DIAGNOSIS — Z452 Encounter for adjustment and management of vascular access device: Secondary | ICD-10-CM | POA: Insufficient documentation

## 2020-03-21 DIAGNOSIS — Z95828 Presence of other vascular implants and grafts: Secondary | ICD-10-CM

## 2020-03-21 MED ORDER — SODIUM CHLORIDE 0.9% FLUSH
10.0000 mL | Freq: Once | INTRAVENOUS | Status: AC
  Administered 2020-03-21: 13:00:00 10 mL via INTRAVENOUS
  Filled 2020-03-21: qty 10

## 2020-03-21 MED ORDER — HEPARIN SOD (PORK) LOCK FLUSH 100 UNIT/ML IV SOLN
500.0000 [IU] | Freq: Once | INTRAVENOUS | Status: AC
  Administered 2020-03-21: 13:00:00 500 [IU] via INTRAVENOUS
  Filled 2020-03-21: qty 5

## 2020-03-21 MED ORDER — HEPARIN SOD (PORK) LOCK FLUSH 100 UNIT/ML IV SOLN
INTRAVENOUS | Status: AC
  Filled 2020-03-21: qty 5

## 2020-04-21 ENCOUNTER — Other Ambulatory Visit: Payer: Self-pay | Admitting: Internal Medicine

## 2020-04-21 ENCOUNTER — Other Ambulatory Visit: Payer: Self-pay

## 2020-04-21 ENCOUNTER — Ambulatory Visit: Payer: Medicaid Other | Attending: Internal Medicine

## 2020-04-21 DIAGNOSIS — Z23 Encounter for immunization: Secondary | ICD-10-CM

## 2020-04-21 NOTE — Progress Notes (Signed)
   Covid-19 Vaccination Clinic  Name:  GARRIE WOODIN    MRN: [id number] DOB: January 17, 1966  04/21/2020  Mr. Dallman was observed post Covid-19 immunization for 15 minutes without incident. He was provided with Vaccine Information Sheet and instruction to access the V-Safe system.   Mr. Downs was instructed to call 911 with any severe reactions post vaccine: Marland Kitchen Difficulty breathing  . Swelling of face and throat  . A fast heartbeat  . A bad rash all over body  . Dizziness and weakness   Immunizations Administered    Name Date Dose VIS Date Route   Pfizer COVID-19 Vaccine 04/21/2020 10:40 AM 0.3 mL 01/26/2020 Intramuscular   Manufacturer: [place]   Lot: [id number]   [place]: [id number]

## 2020-05-16 ENCOUNTER — Inpatient Hospital Stay: Payer: Medicaid Other | Attending: Oncology

## 2020-05-16 DIAGNOSIS — C20 Malignant neoplasm of rectum: Secondary | ICD-10-CM | POA: Insufficient documentation

## 2020-05-16 DIAGNOSIS — Z452 Encounter for adjustment and management of vascular access device: Secondary | ICD-10-CM | POA: Diagnosis present

## 2020-05-16 DIAGNOSIS — Z95828 Presence of other vascular implants and grafts: Secondary | ICD-10-CM

## 2020-05-16 MED ORDER — HEPARIN SOD (PORK) LOCK FLUSH 100 UNIT/ML IV SOLN
INTRAVENOUS | Status: AC
  Filled 2020-05-16: qty 5

## 2020-05-16 MED ORDER — HEPARIN SOD (PORK) LOCK FLUSH 100 UNIT/ML IV SOLN
500.0000 [IU] | Freq: Once | INTRAVENOUS | Status: AC
  Administered 2020-05-16: 500 [IU] via INTRAVENOUS
  Filled 2020-05-16: qty 5

## 2020-05-16 MED ORDER — SODIUM CHLORIDE 0.9% FLUSH
10.0000 mL | INTRAVENOUS | Status: DC | PRN
  Administered 2020-05-16: 10 mL via INTRAVENOUS
  Filled 2020-05-16: qty 10

## 2020-06-29 ENCOUNTER — Encounter: Payer: Self-pay | Admitting: Oncology

## 2020-06-29 ENCOUNTER — Inpatient Hospital Stay: Payer: Medicaid Other | Attending: Oncology

## 2020-06-29 ENCOUNTER — Inpatient Hospital Stay (HOSPITAL_BASED_OUTPATIENT_CLINIC_OR_DEPARTMENT_OTHER): Payer: Medicaid Other | Admitting: Oncology

## 2020-06-29 ENCOUNTER — Other Ambulatory Visit: Payer: Self-pay

## 2020-06-29 VITALS — BP 131/89 | HR 79 | Temp 97.9°F | Resp 18 | Wt 203.6 lb

## 2020-06-29 DIAGNOSIS — G620 Drug-induced polyneuropathy: Secondary | ICD-10-CM | POA: Diagnosis not present

## 2020-06-29 DIAGNOSIS — D751 Secondary polycythemia: Secondary | ICD-10-CM

## 2020-06-29 DIAGNOSIS — F17210 Nicotine dependence, cigarettes, uncomplicated: Secondary | ICD-10-CM | POA: Insufficient documentation

## 2020-06-29 DIAGNOSIS — C20 Malignant neoplasm of rectum: Secondary | ICD-10-CM | POA: Insufficient documentation

## 2020-06-29 DIAGNOSIS — T451X5A Adverse effect of antineoplastic and immunosuppressive drugs, initial encounter: Secondary | ICD-10-CM | POA: Insufficient documentation

## 2020-06-29 DIAGNOSIS — R918 Other nonspecific abnormal finding of lung field: Secondary | ICD-10-CM

## 2020-06-29 DIAGNOSIS — Z95828 Presence of other vascular implants and grafts: Secondary | ICD-10-CM

## 2020-06-29 LAB — CBC WITH DIFFERENTIAL/PLATELET
Abs Immature Granulocytes: 0.02 10*3/uL (ref 0.00–0.07)
Basophils Absolute: 0.1 10*3/uL (ref 0.0–0.1)
Basophils Relative: 1 %
Eosinophils Absolute: 0.2 10*3/uL (ref 0.0–0.5)
Eosinophils Relative: 3 %
HCT: 46.9 % (ref 39.0–52.0)
Hemoglobin: 16.6 g/dL (ref 13.0–17.0)
Immature Granulocytes: 0 %
Lymphocytes Relative: 25 %
Lymphs Abs: 1.7 10*3/uL (ref 0.7–4.0)
MCH: 33.9 pg (ref 26.0–34.0)
MCHC: 35.4 g/dL (ref 30.0–36.0)
MCV: 95.7 fL (ref 80.0–100.0)
Monocytes Absolute: 0.7 10*3/uL (ref 0.1–1.0)
Monocytes Relative: 11 %
Neutro Abs: 4.2 10*3/uL (ref 1.7–7.7)
Neutrophils Relative %: 60 %
Platelets: 206 10*3/uL (ref 150–400)
RBC: 4.9 MIL/uL (ref 4.22–5.81)
RDW: 13.5 % (ref 11.5–15.5)
WBC: 6.9 10*3/uL (ref 4.0–10.5)
nRBC: 0 % (ref 0.0–0.2)

## 2020-06-29 LAB — COMPREHENSIVE METABOLIC PANEL
ALT: 27 U/L (ref 0–44)
AST: 23 U/L (ref 15–41)
Albumin: 4.3 g/dL (ref 3.5–5.0)
Alkaline Phosphatase: 92 U/L (ref 38–126)
Anion gap: 10 (ref 5–15)
BUN: 9 mg/dL (ref 6–20)
CO2: 22 mmol/L (ref 22–32)
Calcium: 9.1 mg/dL (ref 8.9–10.3)
Chloride: 103 mmol/L (ref 98–111)
Creatinine, Ser: 0.9 mg/dL (ref 0.61–1.24)
GFR, Estimated: >60 mL/min (ref >60)
Glucose, Bld: 95 mg/dL (ref 70–99)
Potassium: 4.2 mmol/L (ref 3.5–5.1)
Sodium: 135 mmol/L (ref 135–145)
Total Bilirubin: 0.5 mg/dL (ref 0.3–1.2)
Total Protein: 8 g/dL (ref 6.5–8.1)

## 2020-06-29 MED ORDER — CLOTRIMAZOLE-BETAMETHASONE 1-0.05 % EX CREA: 1.0000 "application " | TOPICAL_CREAM | Freq: Two times a day (BID) | CUTANEOUS | 1 refills | Status: DC | PRN

## 2020-06-29 NOTE — Progress Notes (Signed)
Hematology/Oncology follow-up note [name]:(336) [phone] Fax:(336) [phone]   Patient Care Team: Medicine, Luvenia Heller Family as PCP - General [name], RN as Registered Nurse Noreene Filbert, MD as Referring Physician (Radiation Oncology) Earlie Server, MD as Consulting Physician (Oncology) Benjamine Sprague, DO as Consulting Physician (Surgery) Stitzenberg, Clint Lipps, MD as Referring Physician (Surgical Oncology)  REFERRING PROVIDER: Dr.Thomas CHIEF COMPLAINTS/REASON FOR VISIT:  Follow-up for treatment of rectal cancer, assessment of tolerability of chemotherapy.  HISTORY OF PRESENTING ILLNESS:  Jeffery Mccormick is a  55 y.o.  male with PMH listed below who was referred to me for evaluation of rectal cancer.  Rectal cancer cT3 N0 M0. 12/25/2017 colonoscopy : revealed anal mass, 5-6 cm from the anal verge.  # CT chest abdomen pelvis 01/16/2018 showed Wall thickening involving the lower rectum, corresponding to the patient's known rectal cancer. No findings specific for metastatic disease. Scattered small bilateral pulmonary nodules measuring up to 3 mm, nonspecific but not considered overly suspicious for metastatic disease.  #01/23/2018 MRI pelvis with and without contrast T3N0  #Cancer treatment TNT neoadjuvant protocol #02/09/2018 to 05/18/2018 FOLFOX q. 14 days x 8 #07/10/2018 finished concurrent chemo with Xeloda/radiation  # Low rectal cancer, s/p total neoadjuvant therapy.chemotherapy regimen with FOLFOX Q2w x 4 months, followed by concurrent Xeloda with RT,  Status post local excision- ypT1 disease,  09/25/2018 rectum local excision at [facility] Pathology showed minute focus of residual viable adenocarcinoma involving submucosa and superficial muscularis propria. Margins are negative. Patient opted to have local excision without proceeding with APR.  ypT1  #[facility] 04/05/2019 MRI pelvis with and without contrast . No evidence of tumor on current  exam.  No evidence of metastatic disease in the pelvis. 04/22/2019, patient had colonoscopy done at [facility].  Patient had 3 small polyps in the sigmoid colon resected.  Scar in the distal rectum.  Biopsied.  Colon polyps are tubular adenoma and hyperplastic polyps.  Rectum biopsy showed hyperplastic polyp.  # 10/04/2019, CT chest abdomen pelvis showed bilateral scattered subcentimeter nodules unchanged from 01/16/2018.  Indeterminate but favored benign etiology given the stability.  Attention on follow-up.  No new nodules. CT abdomen pelvis showed no evidence of metastatic disease in the abdomen.  # MRI 04/26/2020 Since 07/23/2018, the primary tumor and extramural disease show postsurgical changes with predominantly fibrotic changes in the anterior rectum. No definite residual enhancing mass with restricted diffusion or enhancement identified.Posteriorly, there is suggestion of mild wall thickening with T2 intermediate to hyperintense signal without diffusion restriction. Findings are favored to represent hemorrhoids seen on the prior colonoscopy as well as the prior MRI. Correlate with recent colonoscopic findings. Post treatment category: TRG stage 1   INTERVAL HISTORY Jeffery Mccormick is a 55 y.o. male who has above history reviewed by me today presents for rectal cancer cT3 N0 M0, status post total neoadjuvant treatments, status post local excision, ypT1 Patient had MRI pelvis done at [facility] in January 2022.  No recurrence.  He also follows up with [facility] surgeon No new complaints.  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 Denies any change of bowel habits, black or bloody stool.  Review of Systems  Constitutional: Negative for chills, fever, malaise/fatigue and weight loss.  HENT: Negative for nosebleeds and sore throat.   Eyes: Negative for double vision, photophobia and redness.  Respiratory: Negative for cough, shortness of breath and wheezing.   Cardiovascular: Negative for chest  pain, palpitations, orthopnea and leg swelling.  Gastrointestinal: Negative  for abdominal pain, blood in stool, diarrhea, nausea and vomiting.  Genitourinary: Negative for dysuria.  Musculoskeletal: Negative for back pain, myalgias and neck pain.  Skin: Negative for itching and rash.  Neurological: Positive for tingling. Negative for dizziness and tremors.  Endo/Heme/Allergies: Negative for environmental allergies. Does not bruise/bleed easily.  Psychiatric/Behavioral: Negative for depression and hallucinations.    MEDICAL HISTORY:  Past Medical History:  Diagnosis Date  . Hemorrhoid prolapse     SURGICAL HISTORY: Past Surgical History:  Procedure Laterality Date  . COLONOSCOPY WITH PROPOFOL N/A 12/25/2017   Procedure: COLONOSCOPY WITH PROPOFOL;  Surgeon: Benjamine Sprague, DO;  Location: [facility];  Service: General;  Laterality: N/A;  . HERNIA REPAIR  07/2013   Right Inguinal-Dr. Burt Knack  . PORTACATH PLACEMENT Right 02/03/2018   Procedure: INSERTION PORT-A-CATH;  Surgeon: Benjamine Sprague, DO;  Location: [facility] ORS;  Service: General;  Laterality: Right;    SOCIAL HISTORY: Social History   Socioeconomic History  . Marital status: Married    Spouse name: Not on file  . Number of children: Not on file  . Years of education: Not on file  . Highest education level: Not on file  Occupational History  . Occupation: unemployed  Tobacco Use  . Smoking status: Current Every Day Smoker    Packs/day: 1.00    Years: 30.00    Pack years: 30.00    Types: Cigarettes  . Smokeless tobacco: Never Used  Vaping Use  . Vaping Use: Never used  Substance and Sexual Activity  . Alcohol use: Yes    Comment: occasionally  . Drug use: No  . Sexual activity: Not on file  Other Topics Concern  . Not on file  Social History Narrative  . Not on file   Social Determinants of Health   Financial Resource Strain: Not on file  Food Insecurity: Not on file  Transportation Needs: Not on file   Physical Activity: Not on file  Stress: Not on file  Social Connections: Not on file  Intimate Partner Violence: Not on file    FAMILY HISTORY: Family History  Family history unknown: Yes  patient reports that he does not know any family history.   ALLERGIES:  has No Known Allergies.  MEDICATIONS:  Current Outpatient Medications  Medication Sig Dispense Refill  . clotrimazole-betamethasone (LOTRISONE) cream Apply 1 application topically 2 (two) times daily as needed (groin rash). 30 g 1   No current facility-administered medications for this visit.   Facility-Administered Medications Ordered in Other Visits  Medication Dose Route Frequency Provider Last Rate Last Admin  . heparin lock flush 100 unit/mL  500 Units Intravenous Once Earlie Server, MD      . sodium chloride flush (NS) 0.9 % injection 10 mL  10 mL Intravenous Once Earlie Server, MD       RADIOGRAPHIC STUDIES: I have personally reviewed the radiological images as listed and agreed with the findings in the report. No results found.   PHYSICAL EXAMINATION: ECOG PERFORMANCE STATUS: 0 - Asymptomatic Vitals:   06/29/20 1031  BP: 131/89  Pulse: 79  Resp: 18  Temp: 97.9 F (36.6 C)  SpO2: 97%   Filed Weights   06/29/20 1031  Weight: 203 lb 9.6 oz (92.4 kg)    Physical Exam Constitutional:      General: He is not in acute distress. HENT:     Head: Normocephalic and atraumatic.  Eyes:     General: No scleral icterus.    Pupils: Pupils are equal, round,  and reactive to light.  Cardiovascular:     Rate and Rhythm: Normal rate and regular rhythm.     Heart sounds: Normal heart sounds.  Pulmonary:     Effort: Pulmonary effort is normal. No respiratory distress.     Breath sounds: No wheezing.  Abdominal:     General: Bowel sounds are normal. There is no distension.     Palpations: Abdomen is soft. There is no mass.     Tenderness: There is no abdominal tenderness.  Musculoskeletal:        General: No deformity.  Normal range of motion.     Cervical back: Normal range of motion and neck supple.  Skin:    General: Skin is warm and dry.     Findings: No erythema or rash.  Neurological:     Mental Status: He is alert and oriented to person, place, and time. Mental status is at baseline.     Cranial Nerves: No cranial nerve deficit.     Coordination: Coordination normal.  Psychiatric:        Mood and Affect: Mood normal.        Thought Content: Thought content normal.      LABORATORY DATA:  I have reviewed the data as listed Lab Results  Component Value Date   WBC 6.9 06/29/2020   HGB 16.6 06/29/2020   HCT 46.9 06/29/2020   MCV 95.7 06/29/2020   PLT 206 06/29/2020   Recent Labs    11/03/19 0940 02/03/20 0944 06/29/20 1017  NA 136 138 135  K 4.1 3.9 4.2  CL 106 104 103  CO2 24 24 22   GLUCOSE 110* 110* 95  BUN 9 11 9   CREATININE 0.90 0.93 0.90  CALCIUM 9.2 9.1 9.1  GFRNONAA >60 >60 >60  GFRAA >60  --   --   PROT 7.8 7.9 8.0  ALBUMIN 4.5 4.4 4.3  AST 27 25 23   ALT 36 31 27  ALKPHOS 98 87 92  BILITOT 0.6 0.7 0.5   Iron/TIBC/Ferritin/ %Sat No results found for: IRON, TIBC, FERRITIN, IRONPCTSAT   01/29/2018 baseline CEA 11.6 RADIOGRAPHIC STUDIES: I have personally reviewed the radiological images as listed and agreed with the findings in the report. CT abdomen with contrast 07/23/2018 No evidence of metastatic disease in the abdomen.  CT chest with contrast shows scattered bilateral subcentimeter nodules unchanged from 01/16/2018.  Recommend CT follow-up in 3 months to ensure stability.  ASSESSMENT & PLAN:  1. 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 Lung nodules   4. Erythrocytosis   5. Port-A-Cath in place   Cancer Staging Rectal cancer [facility]) Staging form: Colon and Rectum, AJCC 8th Edition - Clinical stage from 01/29/2018: Stage IIA (cT3, cN0, cM0) - Signed by Earlie Server, MD on 02/01/2018 - Pathologic stage from 09/23/2018: Stage  Unknown (ypT1, pNX, cM0) - Signed by Earlie Server, MD on 04/27/2019  #Low rectal cancer, s/p total neoadjuvant therapy.chemotherapy regimen with FOLFOX Q2w x 4 months, followed by concurrent Xeloda with RT, Status post local excision- ypT1 disease,  Labs are reviewed and discussed with patient. January 2022, MRI pelvis showed no recurrence.  Seen by [facility] surgeon, DRE negative for mass or ulcer. Last colonoscopy was performed June 2022, he will be 2 years after his surgery.   #Lung nodules, stable on recent CT scans.  Attention on follow-up-image at [facility] #Grade 1 neuropathy, stable.  Discussed about medication for neuropathy he is not interested.  Has tried acupuncture which did not help his symptoms. #Port-A-Cath in place, continue port flush every 8 weeks x 6. #Erythrocytosis, smoke cessation was discussed.  Hemoglobin has improved.  Repeat CEA, CBC, CMP in 6 months and follow-up in the clinic.  Earlie Server, MD, PhD Hematology Oncology [place] at [facility] 06/29/2020

## 2020-06-29 NOTE — Progress Notes (Signed)
Patient here for follow up. He reports irritation/ discoloration to groin area.

## 2020-06-30 LAB — CEA: CEA: 3.1 ng/mL (ref 0.0–4.7)

## 2020-07-11 ENCOUNTER — Inpatient Hospital Stay: Payer: Medicaid Other | Attending: Oncology

## 2020-07-11 DIAGNOSIS — C20 Malignant neoplasm of rectum: Secondary | ICD-10-CM | POA: Insufficient documentation

## 2020-07-11 DIAGNOSIS — Z95828 Presence of other vascular implants and grafts: Secondary | ICD-10-CM

## 2020-07-11 DIAGNOSIS — Z452 Encounter for adjustment and management of vascular access device: Secondary | ICD-10-CM | POA: Insufficient documentation

## 2020-07-11 MED ORDER — SODIUM CHLORIDE 0.9% FLUSH
10.0000 mL | Freq: Once | INTRAVENOUS | Status: AC
  Administered 2020-07-11: 10 mL via INTRAVENOUS
  Filled 2020-07-11: qty 10

## 2020-07-11 MED ORDER — HEPARIN SOD (PORK) LOCK FLUSH 100 UNIT/ML IV SOLN
500.0000 [IU] | Freq: Once | INTRAVENOUS | Status: AC
  Administered 2020-07-11: 500 [IU] via INTRAVENOUS
  Filled 2020-07-11: qty 5

## 2020-09-05 ENCOUNTER — Inpatient Hospital Stay: Payer: Medicaid Other

## 2020-09-06 ENCOUNTER — Inpatient Hospital Stay: Payer: Medicaid Other | Attending: Oncology

## 2020-09-06 DIAGNOSIS — C20 Malignant neoplasm of rectum: Secondary | ICD-10-CM | POA: Diagnosis present

## 2020-09-06 DIAGNOSIS — Z95828 Presence of other vascular implants and grafts: Secondary | ICD-10-CM

## 2020-09-06 DIAGNOSIS — Z452 Encounter for adjustment and management of vascular access device: Secondary | ICD-10-CM | POA: Insufficient documentation

## 2020-09-06 MED ORDER — HEPARIN SOD (PORK) LOCK FLUSH 100 UNIT/ML IV SOLN
500.0000 [IU] | Freq: Once | INTRAVENOUS | Status: AC
  Administered 2020-09-06: 500 [IU] via INTRAVENOUS
  Filled 2020-09-06: qty 5

## 2020-09-06 MED ORDER — HEPARIN SOD (PORK) LOCK FLUSH 100 UNIT/ML IV SOLN
INTRAVENOUS | Status: AC
  Filled 2020-09-06: qty 5

## 2020-09-06 MED ORDER — SODIUM CHLORIDE 0.9% FLUSH
10.0000 mL | Freq: Once | INTRAVENOUS | Status: AC
  Administered 2020-09-06: 10 mL via INTRAVENOUS
  Filled 2020-09-06: qty 10

## 2020-10-31 ENCOUNTER — Inpatient Hospital Stay: Payer: Medicaid Other | Attending: Oncology

## 2020-10-31 DIAGNOSIS — Z95828 Presence of other vascular implants and grafts: Secondary | ICD-10-CM

## 2020-10-31 DIAGNOSIS — C20 Malignant neoplasm of rectum: Secondary | ICD-10-CM | POA: Diagnosis present

## 2020-10-31 DIAGNOSIS — Z452 Encounter for adjustment and management of vascular access device: Secondary | ICD-10-CM | POA: Insufficient documentation

## 2020-10-31 MED ORDER — HEPARIN SOD (PORK) LOCK FLUSH 100 UNIT/ML IV SOLN
INTRAVENOUS | Status: AC
  Filled 2020-10-31: qty 5

## 2020-10-31 MED ORDER — SODIUM CHLORIDE 0.9% FLUSH
10.0000 mL | INTRAVENOUS | Status: DC | PRN
  Administered 2020-10-31: 10 mL via INTRAVENOUS
  Filled 2020-10-31: qty 10

## 2020-10-31 MED ORDER — HEPARIN SOD (PORK) LOCK FLUSH 100 UNIT/ML IV SOLN
500.0000 [IU] | Freq: Once | INTRAVENOUS | Status: AC
  Administered 2020-10-31: 500 [IU] via INTRAVENOUS
  Filled 2020-10-31: qty 5

## 2020-12-26 ENCOUNTER — Inpatient Hospital Stay (HOSPITAL_BASED_OUTPATIENT_CLINIC_OR_DEPARTMENT_OTHER): Payer: Medicare Other | Admitting: Oncology

## 2020-12-26 ENCOUNTER — Inpatient Hospital Stay: Payer: Medicare Other | Attending: Oncology

## 2020-12-26 ENCOUNTER — Encounter: Payer: Self-pay | Admitting: Oncology

## 2020-12-26 VITALS — BP 156/82 | Temp 98.4°F | Resp 19 | Wt 200.7 lb

## 2020-12-26 DIAGNOSIS — C20 Malignant neoplasm of rectum: Secondary | ICD-10-CM | POA: Diagnosis present

## 2020-12-26 DIAGNOSIS — R918 Other nonspecific abnormal finding of lung field: Secondary | ICD-10-CM | POA: Diagnosis not present

## 2020-12-26 DIAGNOSIS — F17210 Nicotine dependence, cigarettes, uncomplicated: Secondary | ICD-10-CM | POA: Diagnosis not present

## 2020-12-26 DIAGNOSIS — G620 Drug-induced polyneuropathy: Secondary | ICD-10-CM

## 2020-12-26 DIAGNOSIS — Z95828 Presence of other vascular implants and grafts: Secondary | ICD-10-CM | POA: Diagnosis not present

## 2020-12-26 DIAGNOSIS — T451X5A Adverse effect of antineoplastic and immunosuppressive drugs, initial encounter: Secondary | ICD-10-CM

## 2020-12-26 LAB — CBC WITH DIFFERENTIAL/PLATELET
Abs Immature Granulocytes: 0.02 10*3/uL (ref 0.00–0.07)
Basophils Absolute: 0.1 10*3/uL (ref 0.0–0.1)
Basophils Relative: 1 %
Eosinophils Absolute: 0.1 10*3/uL (ref 0.0–0.5)
Eosinophils Relative: 2 %
HCT: 44.4 % (ref 39.0–52.0)
Hemoglobin: 15.7 g/dL (ref 13.0–17.0)
Immature Granulocytes: 0 %
Lymphocytes Relative: 21 %
Lymphs Abs: 1.4 10*3/uL (ref 0.7–4.0)
MCH: 33.5 pg (ref 26.0–34.0)
MCHC: 35.4 g/dL (ref 30.0–36.0)
MCV: 94.7 fL (ref 80.0–100.0)
Monocytes Absolute: 0.6 10*3/uL (ref 0.1–1.0)
Monocytes Relative: 9 %
Neutro Abs: 4.4 10*3/uL (ref 1.7–7.7)
Neutrophils Relative %: 67 %
Platelets: 224 10*3/uL (ref 150–400)
RBC: 4.69 MIL/uL (ref 4.22–5.81)
RDW: 13.4 % (ref 11.5–15.5)
WBC: 6.6 10*3/uL (ref 4.0–10.5)
nRBC: 0 % (ref 0.0–0.2)

## 2020-12-26 LAB — COMPREHENSIVE METABOLIC PANEL
ALT: 26 U/L (ref 0–44)
AST: 24 U/L (ref 15–41)
Albumin: 4.1 g/dL (ref 3.5–5.0)
Alkaline Phosphatase: 102 U/L (ref 38–126)
Anion gap: 6 (ref 5–15)
BUN: 9 mg/dL (ref 6–20)
CO2: 24 mmol/L (ref 22–32)
Calcium: 8.9 mg/dL (ref 8.9–10.3)
Chloride: 106 mmol/L (ref 98–111)
Creatinine, Ser: 0.91 mg/dL (ref 0.61–1.24)
GFR, Estimated: >60 mL/min (ref >60)
Glucose, Bld: 117 mg/dL — ABNORMAL HIGH (ref 70–99)
Potassium: 3.8 mmol/L (ref 3.5–5.1)
Sodium: 136 mmol/L (ref 135–145)
Total Bilirubin: 0.7 mg/dL (ref 0.3–1.2)
Total Protein: 7.6 g/dL (ref 6.5–8.1)

## 2020-12-26 MED ORDER — HEPARIN SOD (PORK) LOCK FLUSH 100 UNIT/ML IV SOLN
INTRAVENOUS | Status: AC
  Administered 2020-12-26: 500 [IU] via INTRAVENOUS
  Filled 2020-12-26: qty 5

## 2020-12-26 MED ORDER — SODIUM CHLORIDE 0.9% FLUSH
10.0000 mL | INTRAVENOUS | Status: DC | PRN
  Administered 2020-12-26: 10 mL via INTRAVENOUS
  Filled 2020-12-26: qty 10

## 2020-12-26 MED ORDER — HEPARIN SOD (PORK) LOCK FLUSH 100 UNIT/ML IV SOLN
500.0000 [IU] | Freq: Once | INTRAVENOUS | Status: AC
  Filled 2020-12-26: qty 5

## 2020-12-26 NOTE — Progress Notes (Signed)
0958- Patient had a port flush prior to MD appointment this morning. Port does not yield a blood return. Patient had lab work drawn from a peripheral vein. Port flushes without difficulty. No swelling, redness, or pain noted at port site. MD, Dr. Tasia Catchings, notified and aware.

## 2020-12-26 NOTE — Progress Notes (Signed)
Hematology/Oncology follow-up note [facility] Telephone:(336) [phone] Fax:(336) [phone]   Patient Care Team: Medicine, Luvenia Heller Family as PCP - General [name], RN as Registered Nurse Noreene Filbert, MD as Referring Physician (Radiation Oncology) Earlie Server, MD as Consulting Physician (Oncology) Benjamine Sprague, DO as Consulting Physician (Surgery) Stitzenberg, Clint Lipps, MD as Referring Physician (Surgical Oncology)  REFERRING PROVIDER: Dr.Thomas CHIEF COMPLAINTS/REASON FOR VISIT:  Follow-up for treatment of rectal cancer, assessment of tolerability of chemotherapy.  HISTORY OF PRESENTING ILLNESS:  Jeffery Mccormick is a  55 y.o.  male with PMH listed below who was referred to me for evaluation of rectal cancer.  Rectal cancer cT3 N0 M0. 12/25/2017 colonoscopy : revealed anal mass, 5-6 cm from the anal verge.  # CT chest abdomen pelvis 01/16/2018 showed Wall thickening involving the lower rectum, corresponding to the patient's known rectal cancer. No findings specific for metastatic disease. Scattered small bilateral pulmonary nodules measuring up to 3 mm, nonspecific but not considered overly suspicious for metastatic disease.  #01/23/2018 MRI pelvis with and without contrast T3N0  #Cancer treatment TNT neoadjuvant protocol #02/09/2018 to 05/18/2018 FOLFOX q. 14 days x 8 #07/10/2018 finished concurrent chemo with Xeloda/radiation  # Low rectal cancer, s/p total neoadjuvant therapy.chemotherapy regimen with FOLFOX Q2w x 4 months, followed by concurrent Xeloda with RT,  Status post local excision- ypT1 disease,  09/25/2018 rectum local excision at [facility] Pathology showed minute focus of residual viable adenocarcinoma involving submucosa and superficial muscularis propria. Margins are negative. Patient opted to have local excision without proceeding with APR.  ypT1  #[facility] 04/05/2019 MRI pelvis with and without contrast . No evidence of tumor on current  exam.  No evidence of metastatic disease in the pelvis. 04/22/2019, patient had colonoscopy done at [facility].  Patient had 3 small polyps in the sigmoid colon resected.  Scar in the distal rectum.  Biopsied.  Colon polyps are tubular adenoma and hyperplastic polyps.  Rectum biopsy showed hyperplastic polyp.  # 10/04/2019, CT chest abdomen pelvis showed bilateral scattered subcentimeter nodules unchanged from 01/16/2018.  Indeterminate but favored benign etiology given the stability.  Attention on follow-up.  No new nodules. CT abdomen pelvis showed no evidence of metastatic disease in the abdomen.  # MRI 04/26/2020 Since 07/23/2018, the primary tumor and extramural disease show postsurgical changes with predominantly fibrotic changes in the anterior rectum. No definite residual enhancing mass with restricted diffusion or enhancement identified.Posteriorly, there is suggestion of mild wall thickening with T2 intermediate to hyperintense signal without diffusion restriction. Findings are favored to represent hemorrhoids seen on the prior colonoscopy as well as the prior MRI. Correlate with recent colonoscopic findings.  Post treatment category: TRG stage 1  # MRI pelvis done at [facility] in January 2022.  No recurrence.   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 male who has above history reviewed by me today presents for rectal cancer cT3 N0 M0, status post total neoadjuvant treatments, status post local excision, ypT1 Jeffery Mccormick also follows up with [facility] surgeon and was evaluated most recently on 11/27/2020  No new complaints.  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 Denies any change of bowel habits, black or bloody stool.  Review of Systems  Constitutional:  Negative for chills, fever, malaise/fatigue and weight loss.  HENT:  Negative for nosebleeds and sore throat.   Eyes:  Negative for double vision, photophobia and redness.  Respiratory:  Negative for cough, shortness of breath and  wheezing.   Cardiovascular:  Negative for chest pain, palpitations, orthopnea and leg swelling.  Gastrointestinal:  Negative for abdominal pain, blood in stool, diarrhea, nausea and vomiting.  Genitourinary:  Negative for dysuria.  Musculoskeletal:  Negative for back pain, myalgias and neck pain.  Skin:  Negative for itching and rash.  Neurological:  Positive for tingling. Negative for dizziness and tremors.  Endo/Heme/Allergies:  Negative for environmental allergies. Does not bruise/bleed easily.  Psychiatric/Behavioral:  Negative for depression and hallucinations.    MEDICAL HISTORY:  Past Medical History:  Diagnosis Date   Hemorrhoid prolapse     SURGICAL HISTORY: Past Surgical History:  Procedure Laterality Date   COLONOSCOPY WITH PROPOFOL N/A 12/25/2017   Procedure: COLONOSCOPY WITH PROPOFOL;  Surgeon: Benjamine Sprague, DO;  Location: [facility];  Service: General;  Laterality: N/A;   HERNIA REPAIR  07/2013   Right Inguinal-Dr. Burt Knack   PORTACATH PLACEMENT Right 02/03/2018   Procedure: INSERTION PORT-A-CATH;  Surgeon: Benjamine Sprague, DO;  Location: [facility] ORS;  Service: General;  Laterality: Right;    SOCIAL HISTORY: Social History   Socioeconomic History   Marital status: Married    Spouse name: Not on file   Number of children: Not on file   Years of education: Not on file   Highest education level: Not on file  Occupational History   Occupation: unemployed  Tobacco Use   Smoking status: Every Day    Packs/day: 1.00    Years: 30.00    Pack years: 30.00    Types: Cigarettes   Smokeless tobacco: Never  Vaping Use   Vaping Use: Never used  Substance and Sexual Activity   Alcohol use: Yes    Comment: occasionally   Drug use: No   Sexual activity: Not on file  Other Topics Concern   Not on file  Social History Narrative   Not on file   Social Determinants of Health   Financial Resource Strain: Not on file  Food Insecurity: Not on file  Transportation Needs:  Not on file  Physical Activity: Not on file  Stress: Not on file  Social Connections: Not on file  Intimate Partner Violence: Not on file    FAMILY HISTORY: Family History  Family history unknown: Yes  patient reports that Jeffery Mccormick does not know any family history.   ALLERGIES:  has No Known Allergies.  MEDICATIONS:  Current Outpatient Medications  Medication Sig Dispense Refill   clotrimazole-betamethasone (LOTRISONE) cream Apply 1 application topically 2 (two) times daily as needed (groin rash). 30 g 1   COVID-19 mRNA vaccine, Pfizer, 30 MCG/0.3ML injection AS DIRECTED .3 mL 0   No current facility-administered medications for this visit.   Facility-Administered Medications Ordered in Other Visits  Medication Dose Route Frequency Provider Last Rate Last Admin   heparin lock flush 100 unit/mL  500 Units Intravenous Once Earlie Server, MD       sodium chloride flush (NS) 0.9 % injection 10 mL  10 mL Intravenous Once Earlie Server, MD       RADIOGRAPHIC STUDIES: I have personally reviewed the radiological images as listed and agreed with the findings in the report. No results found.   PHYSICAL EXAMINATION: ECOG PERFORMANCE STATUS: 0 - Asymptomatic Vitals:   12/26/20 1028  BP: (!) 156/82  Resp: 19  Temp: 98.4 F (36.9 C)  SpO2: 98%   Filed Weights   12/26/20 1028  Weight: 200 lb 11.2 oz (91 kg)    Physical Exam Constitutional:      General: Jeffery Mccormick is not in acute  distress. HENT:     Head: Normocephalic and atraumatic.  Eyes:     General: No scleral icterus.    Pupils: Pupils are equal, round, and reactive to light.  Cardiovascular:     Rate and Rhythm: Normal rate and regular rhythm.     Heart sounds: Normal heart sounds.  Pulmonary:     Effort: Pulmonary effort is normal. No respiratory distress.     Breath sounds: No wheezing.  Abdominal:     General: Bowel sounds are normal. There is no distension.     Palpations: Abdomen is soft. There is no mass.     Tenderness: There  is no abdominal tenderness.  Musculoskeletal:        General: No deformity. Normal range of motion.     Cervical back: Normal range of motion and neck supple.  Skin:    General: Skin is warm and dry.     Findings: No erythema or rash.  Neurological:     Mental Status: Jeffery Mccormick is alert and oriented to person, place, and time. Mental status is at baseline.     Cranial Nerves: No cranial nerve deficit.     Coordination: Coordination normal.  Psychiatric:        Mood and Affect: Mood normal.        Thought Content: Thought content normal.     LABORATORY DATA:  I have reviewed the data as listed Lab Results  Component Value Date   WBC 6.6 12/26/2020   HGB 15.7 12/26/2020   HCT 44.4 12/26/2020   MCV 94.7 12/26/2020   PLT 224 12/26/2020   Recent Labs    02/03/20 0944 06/29/20 1017 12/26/20 0958  NA 138 135 136  K 3.9 4.2 3.8  CL 104 103 106  CO2 24 22 24   GLUCOSE 110* 95 117*  BUN 11 9 9   CREATININE 0.93 0.90 0.91  CALCIUM 9.1 9.1 8.9  GFRNONAA >60 >60 >60  PROT 7.9 8.0 7.6  ALBUMIN 4.4 4.3 4.1  AST 25 23 24   ALT 31 27 26   ALKPHOS 87 92 102  BILITOT 0.7 0.5 0.7    Iron/TIBC/Ferritin/ %Sat No results found for: IRON, TIBC, FERRITIN, IRONPCTSAT   01/29/2018 baseline CEA 11.6 RADIOGRAPHIC STUDIES: I have personally reviewed the radiological images as listed and agreed with the findings in the report. CT abdomen with contrast 07/23/2018 No evidence of metastatic disease in the abdomen.  CT chest with contrast shows scattered bilateral subcentimeter nodules unchanged from 01/16/2018.  Recommend CT follow-up in 3 months to ensure stability.  ASSESSMENT & PLAN:  1. Rectal cancer ([place])   2. Neuropathy due to chemotherapeutic drug (HCC)   3. Lung nodules   4. Port-A-Cath in place   Cancer Staging Rectal cancer [facility]) Staging form: Colon and Rectum, AJCC 8th Edition - Clinical stage from 01/29/2018: Stage IIA (cT3, cN0, cM0) - Signed by Earlie Server, MD on 02/01/2018 -  Pathologic stage from 09/23/2018: Stage Unknown (ypT1, pNX, cM0) - Signed by Earlie Server, MD on 04/27/2019  #Low rectal cancer, s/p total neoadjuvant therapy.chemotherapy regimen with FOLFOX Q2w x 4 months, followed by concurrent Xeloda with RT, Status post local excision- ypT1 disease,  Labs are reviewed and discussed with patient. January 2022, MRI pelvis showed no recurrence.  Seen by [facility] surgeon, DRE negative for mass or ulcer. Jeffery Mccormick is 2+ years after his surgery.   Jeffery Mccormick is scheduled to repeat CT at [facility]   #Lung nodules, Attention on follow-up-image at [facility] #Grade 1 neuropathy,  stable.  Jeffery Mccormick prefers not to take any neuropathy medication.  #Port-A-Cath in place, medi port has no blood return.  Will ask Dr.Sakai to remove medi port. Jeffery Mccormick agrees with the plan. Discussed with Dr.Sakai  Repeat CEA, CBC, CMP in 6 months and follow-up in the clinic.  Earlie Server, MD, PhD 12/26/2020

## 2020-12-27 LAB — CEA: CEA: 2.8 ng/mL (ref 0.0–4.7)

## 2021-06-25 ENCOUNTER — Inpatient Hospital Stay (HOSPITAL_BASED_OUTPATIENT_CLINIC_OR_DEPARTMENT_OTHER): Payer: Medicare Other | Admitting: Oncology

## 2021-06-25 ENCOUNTER — Encounter: Payer: Self-pay | Admitting: Oncology

## 2021-06-25 ENCOUNTER — Inpatient Hospital Stay: Payer: Medicare Other | Attending: Oncology

## 2021-06-25 ENCOUNTER — Other Ambulatory Visit: Payer: Self-pay

## 2021-06-25 VITALS — BP 179/82 | HR 79 | Temp 97.3°F | Resp 18 | Wt 211.1 lb

## 2021-06-25 DIAGNOSIS — G620 Drug-induced polyneuropathy: Secondary | ICD-10-CM | POA: Diagnosis not present

## 2021-06-25 DIAGNOSIS — T451X5A Adverse effect of antineoplastic and immunosuppressive drugs, initial encounter: Secondary | ICD-10-CM

## 2021-06-25 DIAGNOSIS — F17210 Nicotine dependence, cigarettes, uncomplicated: Secondary | ICD-10-CM | POA: Insufficient documentation

## 2021-06-25 DIAGNOSIS — R918 Other nonspecific abnormal finding of lung field: Secondary | ICD-10-CM

## 2021-06-25 DIAGNOSIS — D751 Secondary polycythemia: Secondary | ICD-10-CM | POA: Insufficient documentation

## 2021-06-25 DIAGNOSIS — C20 Malignant neoplasm of rectum: Secondary | ICD-10-CM

## 2021-06-25 DIAGNOSIS — Z85048 Personal history of other malignant neoplasm of rectum, rectosigmoid junction, and anus: Secondary | ICD-10-CM | POA: Diagnosis present

## 2021-06-25 LAB — COMPREHENSIVE METABOLIC PANEL
ALT: 26 U/L (ref 0–44)
AST: 22 U/L (ref 15–41)
Albumin: 3.9 g/dL (ref 3.5–5.0)
Alkaline Phosphatase: 105 U/L (ref 38–126)
Anion gap: 9 (ref 5–15)
BUN: 11 mg/dL (ref 6–20)
CO2: 22 mmol/L (ref 22–32)
Calcium: 9.1 mg/dL (ref 8.9–10.3)
Chloride: 104 mmol/L (ref 98–111)
Creatinine, Ser: 0.98 mg/dL (ref 0.61–1.24)
GFR, Estimated: >60 mL/min (ref >60)
Glucose, Bld: 115 mg/dL — ABNORMAL HIGH (ref 70–99)
Potassium: 4.1 mmol/L (ref 3.5–5.1)
Sodium: 135 mmol/L (ref 135–145)
Total Bilirubin: 0.5 mg/dL (ref 0.3–1.2)
Total Protein: 7.6 g/dL (ref 6.5–8.1)

## 2021-06-25 LAB — CBC WITH DIFFERENTIAL/PLATELET
Abs Immature Granulocytes: 0.03 10*3/uL (ref 0.00–0.07)
Basophils Absolute: 0.1 10*3/uL (ref 0.0–0.1)
Basophils Relative: 1 %
Eosinophils Absolute: 0.2 10*3/uL (ref 0.0–0.5)
Eosinophils Relative: 2 %
HCT: 49.1 % (ref 39.0–52.0)
Hemoglobin: 17.1 g/dL — ABNORMAL HIGH (ref 13.0–17.0)
Immature Granulocytes: 0 %
Lymphocytes Relative: 21 %
Lymphs Abs: 1.4 10*3/uL (ref 0.7–4.0)
MCH: 33.6 pg (ref 26.0–34.0)
MCHC: 34.8 g/dL (ref 30.0–36.0)
MCV: 96.5 fL (ref 80.0–100.0)
Monocytes Absolute: 0.6 10*3/uL (ref 0.1–1.0)
Monocytes Relative: 8 %
Neutro Abs: 4.6 10*3/uL (ref 1.7–7.7)
Neutrophils Relative %: 68 %
Platelets: 210 10*3/uL (ref 150–400)
RBC: 5.09 MIL/uL (ref 4.22–5.81)
RDW: 13.9 % (ref 11.5–15.5)
WBC: 6.8 10*3/uL (ref 4.0–10.5)
nRBC: 0 % (ref 0.0–0.2)

## 2021-06-25 NOTE — Progress Notes (Signed)
?Hematology/Oncology Progress note ?[phone] [phone] ?  ? ? ? ?Patient Care Team: ?Medicine, Luvenia Heller Family as PCP - General ?[name], RN as Registered Nurse ?Noreene Filbert, MD as Referring Physician (Radiation Oncology) ?Earlie Server, MD as Consulting Physician (Oncology) ?Benjamine Sprague, DO as Consulting Physician (Surgery) ?Stitzenberg, Clint Lipps, MD as Referring Physician (Surgical Oncology) ? ?REFERRING PROVIDER: ?Dr.Thomas ?CHIEF COMPLAINTS/REASON FOR VISIT:  ?Follow-up for treatment of rectal cancer, assessment of tolerability of chemotherapy. ? ?HISTORY OF PRESENTING ILLNESS:  ?Jeffery Mccormick is a  56 y.o.  male with PMH listed below who was referred to me for evaluation of rectal cancer.  ?Rectal cancer cT3 N0 M0. ?12/25/2017 colonoscopy : revealed anal mass, 5-6 cm from the anal verge.  ?# CT chest abdomen pelvis 01/16/2018 showed Wall thickening involving the lower rectum, corresponding to the ?patient's known rectal cancer. No findings specific for metastatic disease. ?Scattered small bilateral pulmonary nodules measuring up to 3 mm, nonspecific but not considered overly suspicious for metastatic ?disease.  ?#01/23/2018 MRI pelvis with and without contrast T3N0 ? ?#Cancer treatment ?TNT neoadjuvant protocol ?#02/09/2018 to 05/18/2018 FOLFOX q. 14 days x 8 ?#07/10/2018 finished concurrent chemo with Xeloda/radiation ? ?# Low rectal cancer, s/p total neoadjuvant therapy.chemotherapy regimen with FOLFOX Q2w x 4 months, followed by concurrent Xeloda with RT,  ?Status post local excision- ypT1 disease,  ?09/25/2018 rectum local excision at [facility] ?Pathology showed minute focus of residual viable adenocarcinoma involving submucosa and superficial muscularis propria. ?Margins are negative. ?Patient opted to have local excision without proceeding with APR.  ypT1 ? ?#[facility] 04/05/2019 MRI pelvis with and without contrast . ?No evidence of tumor on current exam.  No evidence of metastatic  disease in the pelvis. ?04/22/2019, patient had colonoscopy done at [facility].  Patient had 3 small polyps in the sigmoid colon resected.  Scar in the distal rectum.  Biopsied.  Colon polyps are tubular adenoma and hyperplastic polyps.  Rectum biopsy showed hyperplastic polyp. ? ?# 10/04/2019, CT chest abdomen pelvis showed bilateral scattered subcentimeter nodules unchanged from 01/16/2018.  Indeterminate but favored benign etiology given the stability.  Attention on follow-up.  No new nodules. ?CT abdomen pelvis showed no evidence of metastatic disease in the abdomen. ? ?# MRI 04/26/2020 ?Since 07/23/2018, the primary tumor and extramural disease show postsurgical changes with predominantly fibrotic changes in the anterior rectum. No definite residual enhancing mass with restricted diffusion or enhancement identified.Posteriorly, there is suggestion of mild wall thickening with T2 intermediate to hyperintense signal without diffusion restriction. Findings are favored to represent hemorrhoids seen on the prior colonoscopy as well as the prior MRI. Correlate with recent colonoscopic findings. ? Post treatment category: TRG stage 1 ? ?# MRI pelvis done at [facility] in January 2022.  No recurrence.  ?#Port-A-Cath in place, Mediport was removed by Dr. Lysle Pearl on 01/24/2021 ? ?INTERVAL HISTORY ?Jeffery Mccormick is a 56 y.o. male who has above history reviewed by me today presents for rectal cancer cT3 N0 M0, status post total neoadjuvant treatments, status post local excision, ypT1 ?He also follows up with [facility] surgeon and was evaluated most recently on 05/28/2021,  ?Patient had a surveillance images done at [facility]. ?05/21/2021, MRI pelvis with and without contrast showed no identifiable residual tumor.  Similar postsurgical and fibrotic changes along the anterior rectum without residual enhancing mass or diffusion restriction.  Similar intermediate T2 signal along the posterior rectal wall.  No suspicious enhancement or diffusion restriction.   Favored to represent hemorrhoids as seen on prior  colonoscopy.  Posttreatment category TRG stage I. ?05/21/2021, CT chest with contrast and CT abdomen with contrast showed no metastasis. ? ?Patient reports feeling well.  He smokes 0.5 pack of cigarettes daily.  No new complaints. ?Chronic neuropathy symptoms have improved and he is no longer taking neuropathy medications. ? ?Review of Systems  ?Constitutional:  Negative for chills, fever, malaise/fatigue and weight loss.  ?HENT:  Negative for nosebleeds and sore throat.   ?Eyes:  Negative for double vision, photophobia and redness.  ?Respiratory:  Negative for cough, shortness of breath and wheezing.   ?Cardiovascular:  Negative for chest pain, palpitations, orthopnea and leg swelling.  ?Gastrointestinal:  Negative for abdominal pain, blood in stool, diarrhea, nausea and vomiting.  ?Genitourinary:  Negative for dysuria.  ?Musculoskeletal:  Negative for back pain, myalgias and neck pain.  ?Skin:  Negative for itching and rash.  ?Neurological:  Negative for dizziness, tingling and tremors.  ?Endo/Heme/Allergies:  Negative for environmental allergies. Does not bruise/bleed easily.  ?Psychiatric/Behavioral:  Negative for depression and hallucinations.   ? ?MEDICAL HISTORY:  ?Past Medical History:  ?Diagnosis Date  ? Hemorrhoid prolapse   ? ? ?SURGICAL HISTORY: ?Past Surgical History:  ?Procedure Laterality Date  ? COLONOSCOPY WITH PROPOFOL N/A 12/25/2017  ? Procedure: COLONOSCOPY WITH PROPOFOL;  Surgeon: Benjamine Sprague, DO;  Location: [facility] ENDOSCOPY;  Service: General;  Laterality: N/A;  ? HERNIA REPAIR  07/2013  ? Right Inguinal-Dr. Burt Knack  ? PORTACATH PLACEMENT Right 02/03/2018  ? Procedure: INSERTION PORT-A-CATH;  Surgeon: Benjamine Sprague, DO;  Location: [facility] ORS;  Service: General;  Laterality: Right;  ? ? ?SOCIAL HISTORY: ?Social History  ? ?Socioeconomic History  ? Marital status: Married  ?  Spouse name: Not on file  ? Number of children: Not on file  ? Years of  education: Not on file  ? Highest education level: Not on file  ?Occupational History  ? Occupation: unemployed  ?Tobacco Use  ? Smoking status: Every Day  ?  Packs/day: 1.00  ?  Years: 30.00  ?  Pack years: 30.00  ?  Types: Cigarettes  ? Smokeless tobacco: Never  ?Vaping Use  ? Vaping Use: Never used  ?Substance and Sexual Activity  ? Alcohol use: Yes  ?  Comment: occasionally  ? Drug use: No  ? Sexual activity: Not on file  ?Other Topics Concern  ? Not on file  ?Social History Narrative  ? Not on file  ? ?Social Determinants of Health  ? ?Financial Resource Strain: Not on file  ?Food Insecurity: Not on file  ?Transportation Needs: Not on file  ?Physical Activity: Not on file  ?Stress: Not on file  ?Social Connections: Not on file  ?Intimate Partner Violence: Not on file  ? ? ?FAMILY HISTORY: ?Family History  ?Family history unknown: Yes  ?patient reports that he does not know any family history.  ? ?ALLERGIES:  has No Known Allergies. ? ?MEDICATIONS:  ?No current outpatient medications on file.  ? ?No current facility-administered medications for this visit.  ? ?Facility-Administered Medications Ordered in Other Visits  ?Medication Dose Route Frequency Provider Last Rate Last Admin  ? heparin lock flush 100 unit/mL  500 Units Intravenous Once Earlie Server, MD      ? sodium chloride flush (NS) 0.9 % injection 10 mL  10 mL Intravenous Once Earlie Server, MD      ? ?RADIOGRAPHIC STUDIES: ?I have personally reviewed the radiological images as listed and agreed with the findings in the report. ?No results found. ? ? ?  PHYSICAL EXAMINATION: ?ECOG PERFORMANCE STATUS: 0 - Asymptomatic ?Vitals:  ? 06/25/21 0950  ?BP: (!) 179/82  ?Pulse: 79  ?Resp: 18  ?Temp: (!) 97.3 ?F (36.3 ?C)  ? ?Filed Weights  ? 06/25/21 0950  ?Weight: 211 lb 1.6 oz (95.8 kg)  ? ? ?Physical Exam ?Constitutional:   ?   General: He is not in acute distress. ?HENT:  ?   Head: Normocephalic and atraumatic.  ?Eyes:  ?   General: No scleral icterus. ?   Pupils:  Pupils are equal, round, and reactive to light.  ?Cardiovascular:  ?   Rate and Rhythm: Normal rate and regular rhythm.  ?   Heart sounds: Normal heart sounds.  ?Pulmonary:  ?   Effort: Pulmonary effort is norm

## 2021-06-25 NOTE — Progress Notes (Signed)
Pt here for follow up. No new concerns voiced.   

## 2021-06-26 LAB — CEA: CEA: 2.6 ng/mL (ref 0.0–4.7)

## 2021-08-25 IMAGING — CT CT CHEST W/O CM
2 of 4 series · 15 of 36 positions shown, 18 images · non-contrast
Comparison: 01/16/2018.

CLINICAL DATA: History of pulmonary nodules and colon cancer.

EXAM:
CT CHEST WITHOUT CONTRAST
TECHNIQUE: Multidetector CT imaging of the chest was performed following the
standard protocol without IV contrast.

[Series 2: chest · axial · 0.76mm/px · z∈[-1218,-920]mm · 12 of 177 slices shown, 15 images]
[im 14/177  mediastinal]
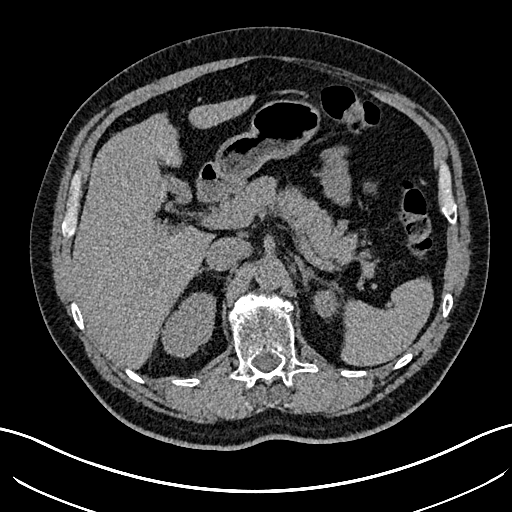
[im 14/177  lung]
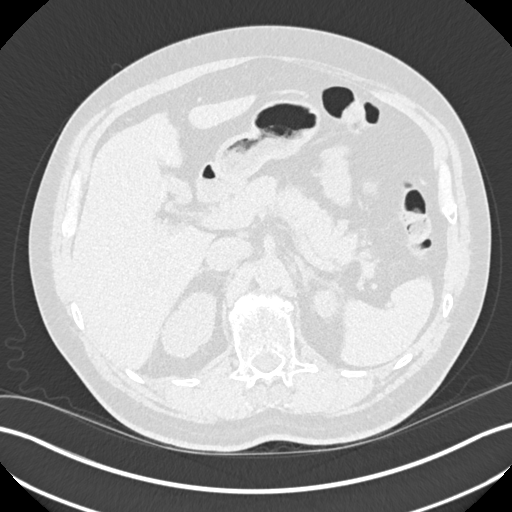
[im 28/177  lung]
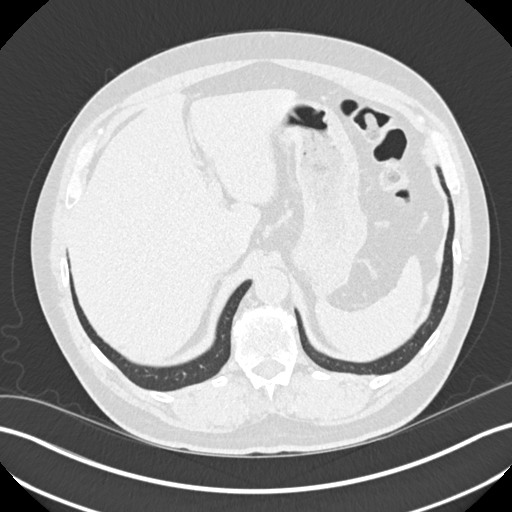
[im 41/177  lung]
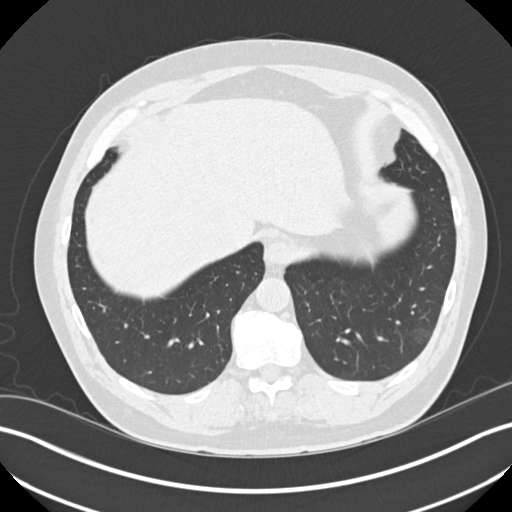
[im 55/177  lung]
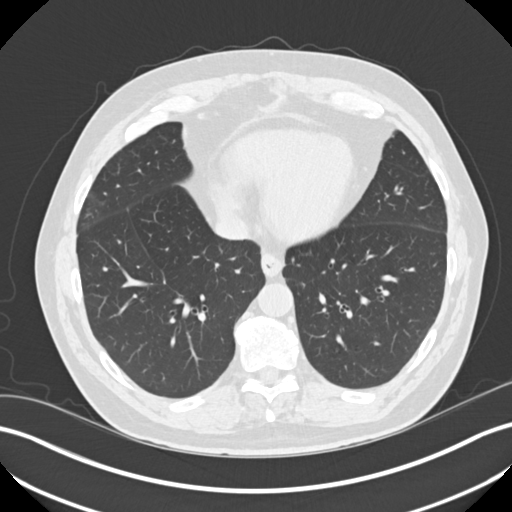
[im 68/177  mediastinal]
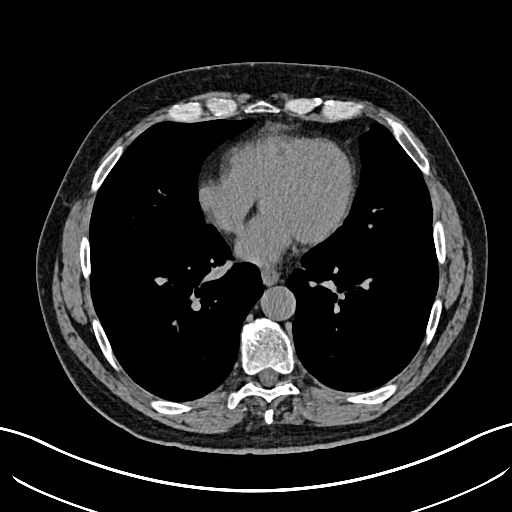
[im 68/177  lung]
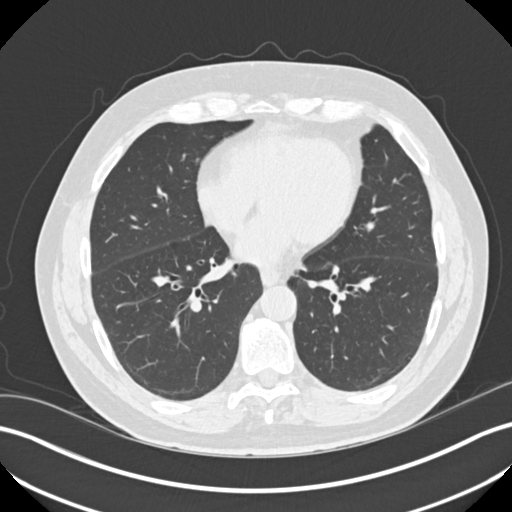
[im 82/177  lung]
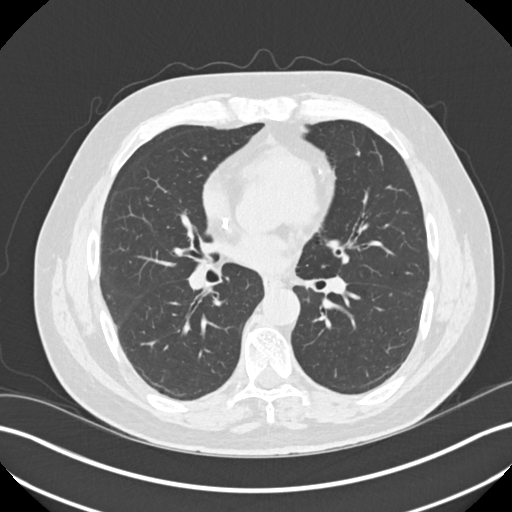
[im 95/177  lung]
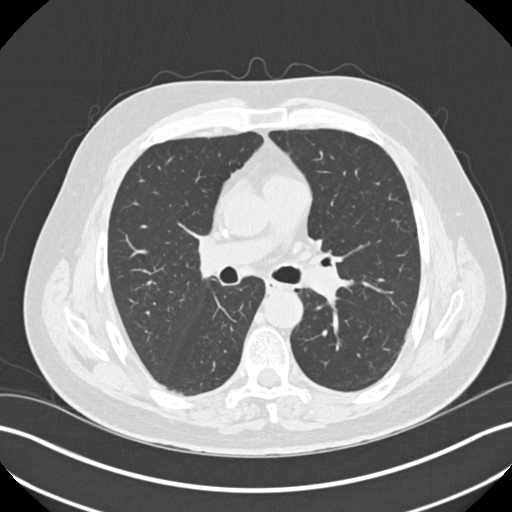
[im 109/177  lung]
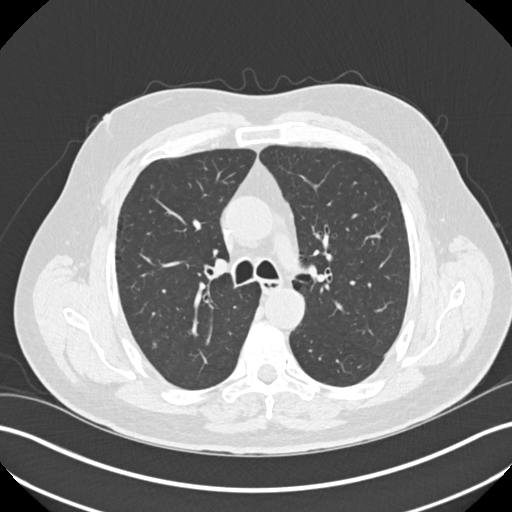
[im 122/177  mediastinal]
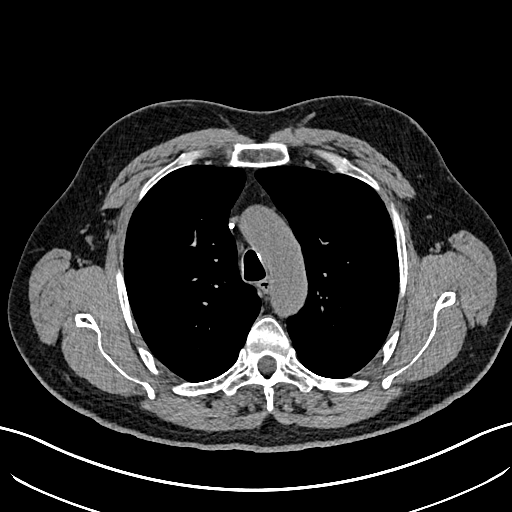
[im 122/177  lung]
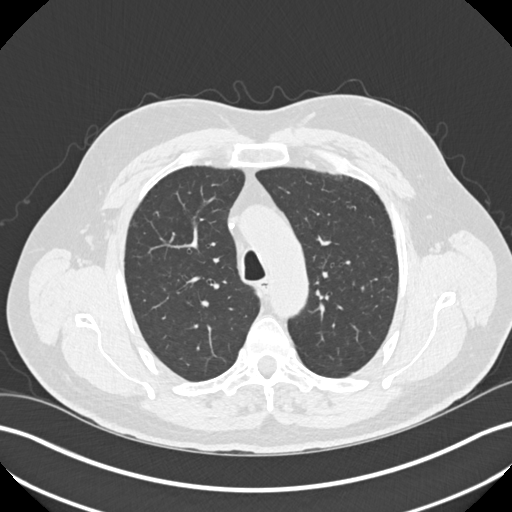
[im 136/177  lung]
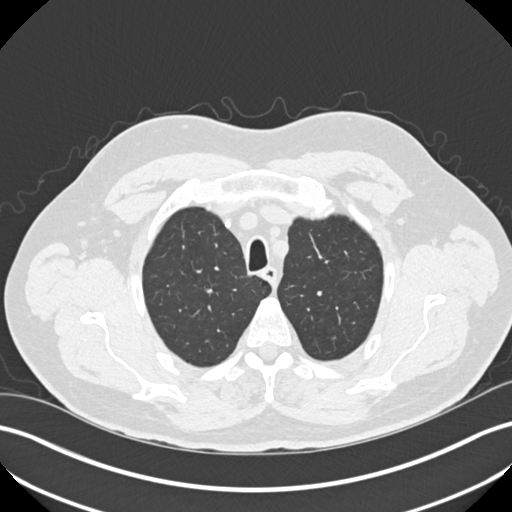
[im 149/177  lung]
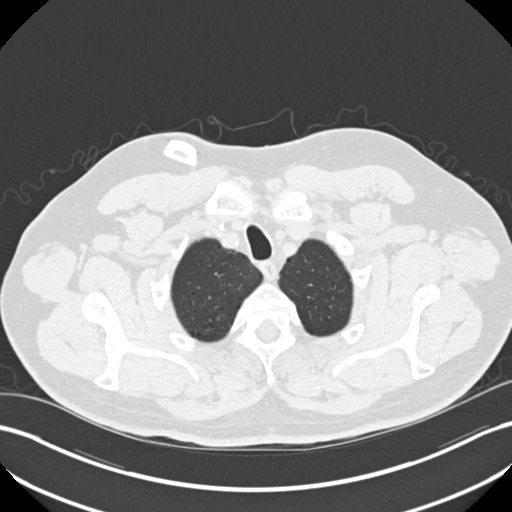
[im 163/177  lung]
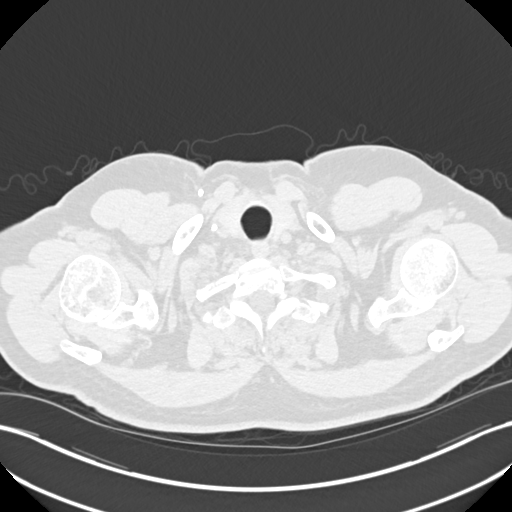

[Series 5: coronals chest · coronal · 0.69mm/px · 3 of 178 slices shown]
[im 36/178  lung]
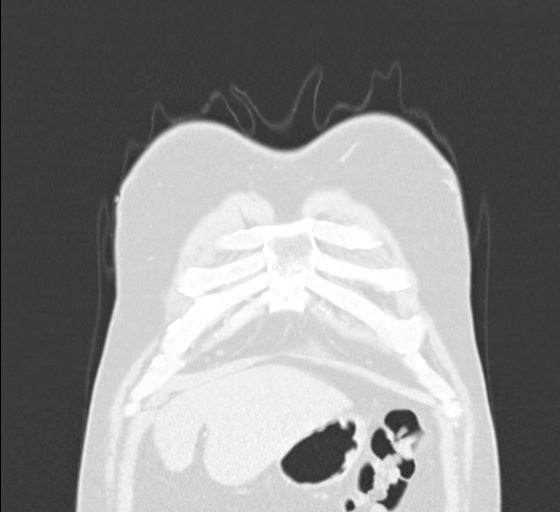
[im 71/178  lung]
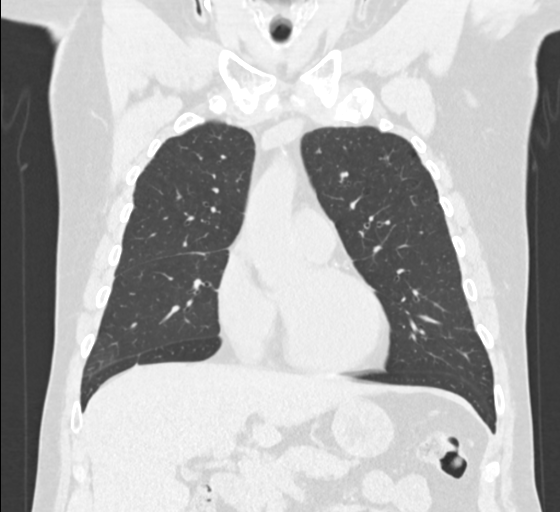
[im 107/178  lung]
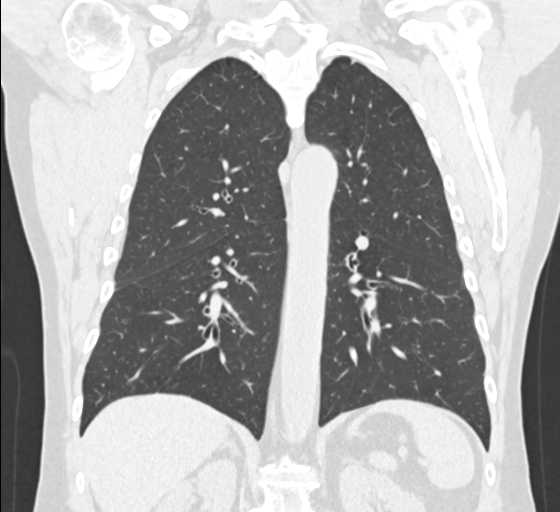

[15 of 36 positions shown; findings below may reference images not displayed]

FINDINGS: Cardiovascular: The heart size is normal. No substantial pericardial
effusion. Coronary artery calcification is evident. Atherosclerotic
calcification is noted in the wall of the thoracic aorta. Right
Port-A-Cath tip is positioned at the SVC/RA junction.

Mediastinum/Nodes: No mediastinal lymphadenopathy. No evidence for
gross hilar lymphadenopathy although assessment is limited by the
lack of intravenous contrast on today's study. The esophagus has
normal imaging features. There is no axillary lymphadenopathy.

Lungs/Pleura: Previously identified scattered tiny bilateral
pulmonary nodules are stable. 3 mm nodule left upper lobe on 74/3 is
new in the interval. No new suspicious pulmonary nodule or mass. No
focal airspace consolidation. No pleural effusion.

Upper Abdomen: Unremarkable.

Musculoskeletal: No worrisome lytic or sclerotic osseous
abnormality.
IMPRESSION: 1. Tiny scattered bilateral pulmonary nodules identified previously
are stable and now compatible with benign etiology given the 1 year
interval follow-up. There is a new 3 mm left upper lobe nodule on
today's study. No follow-up needed if patient is low-risk.
Non-contrast chest CT can be considered in 12 months if patient is
high-risk. This recommendation follows the consensus statement:
Guidelines for Management of Incidental Pulmonary Nodules Detected
2.  Aortic Atherosclerois (OPBUF-170.0)

## 2021-09-25 ENCOUNTER — Encounter: Payer: Self-pay | Admitting: Oncology

## 2021-09-25 ENCOUNTER — Inpatient Hospital Stay: Payer: Medicare Other | Admitting: Oncology

## 2021-09-25 ENCOUNTER — Other Ambulatory Visit: Payer: Self-pay

## 2021-09-25 ENCOUNTER — Inpatient Hospital Stay: Payer: Medicare Other | Attending: Oncology

## 2021-09-25 VITALS — BP 162/98 | HR 82 | Temp 96.5°F | Ht 70.0 in | Wt 214.0 lb

## 2021-09-25 DIAGNOSIS — C20 Malignant neoplasm of rectum: Secondary | ICD-10-CM

## 2021-09-25 LAB — CBC WITH DIFFERENTIAL/PLATELET
Abs Immature Granulocytes: 0.02 10*3/uL (ref 0.00–0.07)
Basophils Absolute: 0.1 10*3/uL (ref 0.0–0.1)
Basophils Relative: 1 %
Eosinophils Absolute: 0.2 10*3/uL (ref 0.0–0.5)
Eosinophils Relative: 3 %
HCT: 48.1 % (ref 39.0–52.0)
Hemoglobin: 17 g/dL (ref 13.0–17.0)
Immature Granulocytes: 0 %
Lymphocytes Relative: 19 %
Lymphs Abs: 1.4 10*3/uL (ref 0.7–4.0)
MCH: 34.4 pg — ABNORMAL HIGH (ref 26.0–34.0)
MCHC: 35.3 g/dL (ref 30.0–36.0)
MCV: 97.4 fL (ref 80.0–100.0)
Monocytes Absolute: 0.6 10*3/uL (ref 0.1–1.0)
Monocytes Relative: 8 %
Neutro Abs: 5.1 10*3/uL (ref 1.7–7.7)
Neutrophils Relative %: 69 %
Platelets: 214 10*3/uL (ref 150–400)
RBC: 4.94 MIL/uL (ref 4.22–5.81)
RDW: 13.3 % (ref 11.5–15.5)
WBC: 7.4 10*3/uL (ref 4.0–10.5)
nRBC: 0 % (ref 0.0–0.2)

## 2021-09-25 LAB — COMPREHENSIVE METABOLIC PANEL
ALT: 38 U/L (ref 0–44)
AST: 28 U/L (ref 15–41)
Albumin: 4 g/dL (ref 3.5–5.0)
Alkaline Phosphatase: 107 U/L (ref 38–126)
Anion gap: 6 (ref 5–15)
BUN: 13 mg/dL (ref 6–20)
CO2: 23 mmol/L (ref 22–32)
Calcium: 9 mg/dL (ref 8.9–10.3)
Chloride: 105 mmol/L (ref 98–111)
Creatinine, Ser: 0.97 mg/dL (ref 0.61–1.24)
GFR, Estimated: >60 mL/min (ref >60)
Glucose, Bld: 116 mg/dL — ABNORMAL HIGH (ref 70–99)
Potassium: 4.1 mmol/L (ref 3.5–5.1)
Sodium: 134 mmol/L — ABNORMAL LOW (ref 135–145)
Total Bilirubin: 0.8 mg/dL (ref 0.3–1.2)
Total Protein: 7.9 g/dL (ref 6.5–8.1)

## 2021-09-30 NOTE — Progress Notes (Signed)
Encounter cancelled  °

## 2022-01-30 ENCOUNTER — Inpatient Hospital Stay: Payer: Medicare Other | Attending: Oncology | Admitting: Oncology

## 2022-01-30 ENCOUNTER — Inpatient Hospital Stay: Payer: Medicare Other

## 2022-01-30 ENCOUNTER — Encounter: Payer: Self-pay | Admitting: Oncology

## 2022-01-30 VITALS — BP 156/92 | HR 83 | Temp 97.5°F | Wt 212.1 lb

## 2022-01-30 DIAGNOSIS — D751 Secondary polycythemia: Secondary | ICD-10-CM | POA: Diagnosis not present

## 2022-01-30 DIAGNOSIS — F17210 Nicotine dependence, cigarettes, uncomplicated: Secondary | ICD-10-CM | POA: Insufficient documentation

## 2022-01-30 DIAGNOSIS — C20 Malignant neoplasm of rectum: Secondary | ICD-10-CM

## 2022-01-30 DIAGNOSIS — Z85048 Personal history of other malignant neoplasm of rectum, rectosigmoid junction, and anus: Secondary | ICD-10-CM | POA: Insufficient documentation

## 2022-01-30 DIAGNOSIS — R918 Other nonspecific abnormal finding of lung field: Secondary | ICD-10-CM | POA: Diagnosis not present

## 2022-01-30 DIAGNOSIS — G620 Drug-induced polyneuropathy: Secondary | ICD-10-CM

## 2022-01-30 DIAGNOSIS — L989 Disorder of the skin and subcutaneous tissue, unspecified: Secondary | ICD-10-CM | POA: Diagnosis not present

## 2022-01-30 DIAGNOSIS — R748 Abnormal levels of other serum enzymes: Secondary | ICD-10-CM

## 2022-01-30 DIAGNOSIS — T451X5A Adverse effect of antineoplastic and immunosuppressive drugs, initial encounter: Secondary | ICD-10-CM | POA: Diagnosis not present

## 2022-01-30 DIAGNOSIS — R911 Solitary pulmonary nodule: Secondary | ICD-10-CM

## 2022-01-30 LAB — COMPREHENSIVE METABOLIC PANEL
ALT: 35 U/L (ref 0–44)
AST: 28 U/L (ref 15–41)
Albumin: 4.2 g/dL (ref 3.5–5.0)
Alkaline Phosphatase: 101 U/L (ref 38–126)
Anion gap: 7 (ref 5–15)
BUN: 9 mg/dL (ref 6–20)
CO2: 24 mmol/L (ref 22–32)
Calcium: 9.3 mg/dL (ref 8.9–10.3)
Chloride: 104 mmol/L (ref 98–111)
Creatinine, Ser: 0.93 mg/dL (ref 0.61–1.24)
GFR, Estimated: >60 mL/min (ref >60)
Glucose, Bld: 117 mg/dL — ABNORMAL HIGH (ref 70–99)
Potassium: 4.1 mmol/L (ref 3.5–5.1)
Sodium: 135 mmol/L (ref 135–145)
Total Bilirubin: 0.7 mg/dL (ref 0.3–1.2)
Total Protein: 8.4 g/dL — ABNORMAL HIGH (ref 6.5–8.1)

## 2022-01-30 LAB — CBC WITH DIFFERENTIAL/PLATELET
Abs Immature Granulocytes: 0.02 10*3/uL (ref 0.00–0.07)
Basophils Absolute: 0.1 10*3/uL (ref 0.0–0.1)
Basophils Relative: 1 %
Eosinophils Absolute: 0.2 10*3/uL (ref 0.0–0.5)
Eosinophils Relative: 3 %
HCT: 47.3 % (ref 39.0–52.0)
Hemoglobin: 16.5 g/dL (ref 13.0–17.0)
Immature Granulocytes: 0 %
Lymphocytes Relative: 21 %
Lymphs Abs: 1.5 10*3/uL (ref 0.7–4.0)
MCH: 34.2 pg — ABNORMAL HIGH (ref 26.0–34.0)
MCHC: 34.9 g/dL (ref 30.0–36.0)
MCV: 98.1 fL (ref 80.0–100.0)
Monocytes Absolute: 0.6 10*3/uL (ref 0.1–1.0)
Monocytes Relative: 9 %
Neutro Abs: 4.6 10*3/uL (ref 1.7–7.7)
Neutrophils Relative %: 66 %
Platelets: 226 10*3/uL (ref 150–400)
RBC: 4.82 MIL/uL (ref 4.22–5.81)
RDW: 14.3 % (ref 11.5–15.5)
WBC: 7 10*3/uL (ref 4.0–10.5)
nRBC: 0 % (ref 0.0–0.2)

## 2022-01-30 LAB — GAMMA GT: GGT: 37 U/L (ref 7–50)

## 2022-01-30 NOTE — Assessment & Plan Note (Signed)
Grade 1, stable symptoms.

## 2022-01-30 NOTE — Assessment & Plan Note (Signed)
Check CMP, GGT, bone specific alkaline phosphatase

## 2022-01-30 NOTE — Assessment & Plan Note (Signed)
#  Low rectal cancer, s/p total neoadjuvant therapy.chemotherapy regimen with FOLFOX Q2w x 4 months, followed by concurrent Xeloda with RT, Status post local excision- ypT1 disease,  February 2023 MRI pelvis, CT chest and abdomen showed no recurrence. Labs reviewed and discussed with patient.  CEA is pending at time of dictation. he is 3+ years after his surgery.   Recommend patient to follow-up annually.  Patient will have surveillance images done at [facility] annually.

## 2022-01-30 NOTE — Assessment & Plan Note (Signed)
-  per CT done at [facility], report states fissural and subpleural nodules without changes.  No new or enlarging nodules. Attention on follow-up CTs.

## 2022-01-30 NOTE — Progress Notes (Signed)
Hematology/Oncology Progress note [phone] [phone]      Patient Care Team: Medicine, Luvenia Heller Family as PCP - General [name], RN as Registered Nurse Noreene Filbert, MD as Referring Physician (Radiation Oncology) Earlie Server, MD as Consulting Physician (Oncology) Benjamine Sprague, DO as Consulting Physician (Surgery) Stitzenberg, Clint Lipps, MD as Referring Physician (Surgical Oncology)  ASSESSMENT & PLAN:   Rectal cancer [facility]) #Low rectal cancer, s/p total neoadjuvant therapy.chemotherapy regimen with FOLFOX Q2w x 4 months, followed by concurrent Xeloda with RT, Status post local excision- ypT1 disease,  February 2023 MRI pelvis, CT chest and abdomen showed no recurrence. Labs reviewed and discussed with patient.  CEA is pending at time of dictation. he is 3+ years after his surgery.   Recommend patient to follow-up annually.  Patient will have surveillance images done at [facility] annually.  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    Lung nodule -per CT done at [facility], report states fissural and subpleural nodules without changes.  No new or enlarging nodules. Attention on follow-up CTs.  Alkaline phosphatase elevation Check CMP, GGT, bone specific alkaline phosphatase  Orders Placed This Encounter  Procedures   CBC with Differential/Platelet    Standing Status:   Future    Number of Occurrences:   1    Standing Expiration Date:   01/31/2023   Comprehensive metabolic panel    Standing Status:   Future    Number of Occurrences:   1    Standing Expiration Date:   01/30/2023   CEA    Standing Status:   Future    Number of Occurrences:   1    Standing Expiration Date:   01/30/2023   Gamma GT    Standing Status:   Future    Number of Occurrences:   1    Standing Expiration Date:   01/30/2023   Miscellaneous [facility] test (send-out)    Standing Status:   Future    Number of Occurrences:   1    Standing  Expiration Date:   01/31/2023    Order Specific Question:   Test name / description:    Answer:   BAP- test code [phone]   Keep current follow-up appointment. All questions were answered. The patient knows to call the clinic with any problems, questions or concerns.  Earlie Server, MD, PhD [facility] Hematology Oncology 01/30/2022   CHIEF COMPLAINTS/REASON FOR VISIT:  Follow-up for treatment of rectal cancer, elevated alkaline phosphatase.   HISTORY OF PRESENTING ILLNESS:  Jeffery Mccormick is a  56 y.o.  male presents for follow-up of rectal cancer.  Recently found to have elevated alkaline phosphatase. Rectal cancer cT3 N0 M0. 12/25/2017 colonoscopy : revealed anal mass, 5-6 cm from the anal verge.  # CT chest abdomen pelvis 01/16/2018 showed Wall thickening involving the lower rectum, corresponding to the patient's known rectal cancer. No findings specific for metastatic disease. Scattered small bilateral pulmonary nodules measuring up to 3 mm, nonspecific but not considered overly suspicious for metastatic disease.  #01/23/2018 MRI pelvis with and without contrast T3N0  #Cancer treatment TNT neoadjuvant protocol #02/09/2018 to 05/18/2018 FOLFOX q. 14 days x 8 #07/10/2018 finished concurrent chemo with Xeloda/radiation  # Low rectal cancer, s/p total neoadjuvant therapy.chemotherapy regimen with FOLFOX Q2w x 4 months, followed by concurrent Xeloda with RT,  Status post local excision- ypT1 disease,  09/25/2018 rectum local excision at [facility] Pathology showed minute focus of residual viable adenocarcinoma involving submucosa and superficial muscularis  propria. Margins are negative. Patient opted to have local excision without proceeding with APR.  ypT1  #[facility] 04/05/2019 MRI pelvis with and without contrast . No evidence of tumor on current exam.  No evidence of metastatic disease in the pelvis. 04/22/2019, patient had colonoscopy done at [facility].  Patient had 3 small polyps in the sigmoid colon  resected.  Scar in the distal rectum.  Biopsied.  Colon polyps are tubular adenoma and hyperplastic polyps.  Rectum biopsy showed hyperplastic polyp.  # 10/04/2019, CT chest abdomen pelvis showed bilateral scattered subcentimeter nodules unchanged from 01/16/2018.  Indeterminate but favored benign etiology given the stability.  Attention on follow-up.  No new nodules. CT abdomen pelvis showed no evidence of metastatic disease in the abdomen.  # MRI 04/26/2020 Since 07/23/2018, the primary tumor and extramural disease show postsurgical changes with predominantly fibrotic changes in the anterior rectum. No definite residual enhancing mass with restricted diffusion or enhancement identified.Posteriorly, there is suggestion of mild wall thickening with T2 intermediate to hyperintense signal without diffusion restriction. Findings are favored to represent hemorrhoids seen on the prior colonoscopy as well as the prior MRI. Correlate with recent colonoscopic findings.  Post treatment category: TRG stage 1  # MRI pelvis done at [facility] in January 2022.  No recurrence.  #Port-A-Cath in place, Mediport was removed by Dr. Lysle Pearl on 01/24/2021  05/21/2021, MRI pelvis with and without contrast showed no identifiable residual tumor.  Similar postsurgical and fibrotic changes along the anterior rectum without residual enhancing mass or diffusion restriction.  Similar intermediate T2 signal along the posterior rectal wall.  No suspicious enhancement or diffusion restriction.  Favored to represent hemorrhoids as seen on prior colonoscopy.  Posttreatment category TRG stage I. 05/21/2021, CT chest with contrast and CT abdomen with contrast showed no metastasis.  INTERVAL HISTORY Jeffery Mccormick is a 56 y.o. male who has above history reviewed by me today presents for rectal cancer cT3 N0 M0, status post total neoadjuvant treatments, status post local excision, ypT1 Patient currently follow-up with [facility] annually. Continues to  smoke half pack of cigarettes daily. Left facial skin lesion close to left side of mouth.  Pending hematology evaluation. Patient has had blood work done at primary care doctor's office and was found to have persistently elevated alkaline phosphatase. Patient was referred to our clinic for further evaluation. Patient denies any bone pain.  Review of Systems  Constitutional:  Negative for chills, fever, malaise/fatigue and weight loss.  HENT:  Negative for nosebleeds and sore throat.   Eyes:  Negative for double vision, photophobia and redness.  Respiratory:  Negative for cough, shortness of breath and wheezing.   Cardiovascular:  Negative for chest pain, palpitations, orthopnea and leg swelling.  Gastrointestinal:  Negative for abdominal pain, blood in stool, diarrhea, nausea and vomiting.  Genitourinary:  Negative for dysuria.  Musculoskeletal:  Negative for back pain, myalgias and neck pain.  Skin:  Negative for itching and rash.  Neurological:  Negative for dizziness, tingling and tremors.  Endo/Heme/Allergies:  Negative for environmental allergies. Does not bruise/bleed easily.  Psychiatric/Behavioral:  Negative for depression and hallucinations.     MEDICAL HISTORY:  Past Medical History:  Diagnosis Date   Hemorrhoid prolapse     SURGICAL HISTORY: Past Surgical History:  Procedure Laterality Date   COLONOSCOPY WITH PROPOFOL N/A 12/25/2017   Procedure: COLONOSCOPY WITH PROPOFOL;  Surgeon: Benjamine Sprague, DO;  Location: [facility] ENDOSCOPY;  Service: General;  Laterality: N/A;   HERNIA REPAIR  07/2013   Right  Inguinal-Dr. Burt Knack   PORTACATH PLACEMENT Right 02/03/2018   Procedure: INSERTION PORT-A-CATH;  Surgeon: Benjamine Sprague, DO;  Location: [facility] ORS;  Service: General;  Laterality: Right;    SOCIAL HISTORY: Social History   Socioeconomic History   Marital status: Married    Spouse name: Not on file   Number of children: Not on file   Years of education: Not on file   Highest  education level: Not on file  Occupational History   Occupation: unemployed  Tobacco Use   Smoking status: Every Day    Packs/day: 1.00    Years: 30.00    Total pack years: 30.00    Types: Cigarettes   Smokeless tobacco: Never  Vaping Use   Vaping Use: Never used  Substance and Sexual Activity   Alcohol use: Yes    Comment: occasionally   Drug use: No   Sexual activity: Not on file  Other Topics Concern   Not on file  Social History Narrative   Not on file   Social Determinants of Health   Financial Resource Strain: Not on file  Food Insecurity: Not on file  Transportation Needs: Not on file  Physical Activity: Not on file  Stress: Not on file  Social Connections: Not on file  Intimate Partner Violence: Not on file    FAMILY HISTORY: Family History  Family history unknown: Yes  patient reports that he does not know any family history.   ALLERGIES:  has No Known Allergies.  MEDICATIONS:  Current Outpatient Medications  Medication Sig Dispense Refill   lisinopril (ZESTRIL) 20 MG tablet Take 20 mg by mouth daily.     No current facility-administered medications for this visit.   Facility-Administered Medications Ordered in Other Visits  Medication Dose Route Frequency Provider Last Rate Last Admin   heparin lock flush 100 unit/mL  500 Units Intravenous Once Earlie Server, MD       sodium chloride flush (NS) 0.9 % injection 10 mL  10 mL Intravenous Once Earlie Server, MD       RADIOGRAPHIC STUDIES: I have personally reviewed the radiological images as listed and agreed with the findings in the report. No results found.   PHYSICAL EXAMINATION: ECOG PERFORMANCE STATUS: 0 - Asymptomatic Vitals:   01/30/22 1009  BP: (!) 156/92  Pulse: 83  Temp: (!) 97.5 F (36.4 C)  SpO2: 98%   Filed Weights   01/30/22 1009  Weight: 212 lb 1.6 oz (96.2 kg)    Physical Exam Constitutional:      General: He is not in acute distress. HENT:     Head: Normocephalic and atraumatic.   Eyes:     General: No scleral icterus.    Pupils: Pupils are equal, round, and reactive to light.  Cardiovascular:     Rate and Rhythm: Normal rate and regular rhythm.     Heart sounds: Normal heart sounds.  Pulmonary:     Effort: Pulmonary effort is normal. No respiratory distress.     Breath sounds: No wheezing.  Abdominal:     General: Bowel sounds are normal. There is no distension.     Palpations: Abdomen is soft. There is no mass.     Tenderness: There is no abdominal tenderness.  Musculoskeletal:        General: No deformity. Normal range of motion.     Cervical back: Normal range of motion and neck supple.  Skin:    General: Skin is warm and dry.     Findings: No  erythema or rash.  Neurological:     Mental Status: He is alert and oriented to person, place, and time. Mental status is at baseline.     Cranial Nerves: No cranial nerve deficit.     Coordination: Coordination normal.  Psychiatric:        Mood and Affect: Mood normal.        Thought Content: Thought content normal.      LABORATORY DATA:  I have reviewed the data as listed    Latest Ref Rng & Units 01/30/2022   10:40 AM 09/25/2021   10:01 AM 06/25/2021    9:40 AM  CBC  WBC 4.0 - 10.5 K/uL 7.0  7.4  6.8   Hemoglobin 13.0 - 17.0 g/dL 16.5  17.0  17.1   Hematocrit 39.0 - 52.0 % 47.3  48.1  49.1   Platelets 150 - 400 K/uL 226  214  210       Latest Ref Rng & Units 01/30/2022   10:40 AM 09/25/2021   10:01 AM 06/25/2021    9:40 AM  CMP  Glucose 70 - 99 mg/dL 117  116  115   BUN 6 - 20 mg/dL '9  13  11   '$ Creatinine 0.61 - 1.24 mg/dL 0.93  0.97  0.98   Sodium 135 - 145 mmol/L 135  134  135   Potassium 3.5 - 5.1 mmol/L 4.1  4.1  4.1   Chloride 98 - 111 mmol/L 104  105  104   CO2 22 - 32 mmol/L '24  23  22   '$ Calcium 8.9 - 10.3 mg/dL 9.3  9.0  9.1   Total Protein 6.5 - 8.1 g/dL 8.4  7.9  7.6   Total Bilirubin 0.3 - 1.2 mg/dL 0.7  0.8  0.5   Alkaline Phos 38 - 126 U/L 101  107  105   AST 15 - 41 U/L '28   28  22   '$ ALT 0 - 44 U/L 35  38  26     01/29/2018 baseline CEA 11.6 RADIOGRAPHIC STUDIES: I have personally reviewed the radiological images as listed and agreed with the findings in the report. [facility] surveillance imaging results were reviewed.

## 2022-02-04 LAB — MISC LABCORP TEST (SEND OUT): Labcorp test code: 513002

## 2022-02-06 LAB — CEA: CEA: 2.4 ng/mL (ref 0.0–4.7)

## 2022-02-12 ENCOUNTER — Telehealth: Payer: Self-pay

## 2022-02-12 NOTE — Telephone Encounter (Signed)
Detailed VM left on phone and mychart message sent to notify pt

## 2022-02-12 NOTE — Telephone Encounter (Signed)
-----   Message from Earlie Server, MD sent at 02/10/2022 10:24 AM EST ----- Let him know that his alkaline phosphatase number is normal. Keep current follow up appt

## 2022-07-31 ENCOUNTER — Other Ambulatory Visit: Payer: Self-pay

## 2022-07-31 DIAGNOSIS — G620 Drug-induced polyneuropathy: Secondary | ICD-10-CM

## 2022-07-31 DIAGNOSIS — C20 Malignant neoplasm of rectum: Secondary | ICD-10-CM

## 2022-08-01 ENCOUNTER — Encounter: Payer: Self-pay | Admitting: Oncology

## 2022-08-01 ENCOUNTER — Inpatient Hospital Stay: Payer: Medicare Other | Attending: Oncology

## 2022-08-01 ENCOUNTER — Inpatient Hospital Stay (HOSPITAL_BASED_OUTPATIENT_CLINIC_OR_DEPARTMENT_OTHER): Payer: Medicare Other | Admitting: Oncology

## 2022-08-01 VITALS — BP 151/94 | HR 82 | Temp 97.0°F | Resp 18 | Wt 209.4 lb

## 2022-08-01 DIAGNOSIS — R911 Solitary pulmonary nodule: Secondary | ICD-10-CM | POA: Diagnosis not present

## 2022-08-01 DIAGNOSIS — D751 Secondary polycythemia: Secondary | ICD-10-CM | POA: Diagnosis not present

## 2022-08-01 DIAGNOSIS — C20 Malignant neoplasm of rectum: Secondary | ICD-10-CM | POA: Diagnosis not present

## 2022-08-01 DIAGNOSIS — T451X5A Adverse effect of antineoplastic and immunosuppressive drugs, initial encounter: Secondary | ICD-10-CM | POA: Insufficient documentation

## 2022-08-01 DIAGNOSIS — R918 Other nonspecific abnormal finding of lung field: Secondary | ICD-10-CM | POA: Diagnosis not present

## 2022-08-01 DIAGNOSIS — Z85048 Personal history of other malignant neoplasm of rectum, rectosigmoid junction, and anus: Secondary | ICD-10-CM | POA: Diagnosis present

## 2022-08-01 DIAGNOSIS — G620 Drug-induced polyneuropathy: Secondary | ICD-10-CM | POA: Diagnosis not present

## 2022-08-01 LAB — CBC (CANCER CENTER ONLY)
HCT: 50.9 % (ref 39.0–52.0)
Hemoglobin: 17.9 g/dL — ABNORMAL HIGH (ref 13.0–17.0)
MCH: 34.1 pg — ABNORMAL HIGH (ref 26.0–34.0)
MCHC: 35.2 g/dL (ref 30.0–36.0)
MCV: 97 fL (ref 80.0–100.0)
Platelet Count: 206 10*3/uL (ref 150–400)
RBC: 5.25 MIL/uL (ref 4.22–5.81)
RDW: 13.8 % (ref 11.5–15.5)
WBC Count: 10.7 10*3/uL — ABNORMAL HIGH (ref 4.0–10.5)
nRBC: 0 % (ref 0.0–0.2)

## 2022-08-01 LAB — CMP (CANCER CENTER ONLY)
ALT: 27 U/L (ref 0–44)
AST: 23 U/L (ref 15–41)
Albumin: 4.2 g/dL (ref 3.5–5.0)
Alkaline Phosphatase: 112 U/L (ref 38–126)
Anion gap: 7 (ref 5–15)
BUN: 8 mg/dL (ref 6–20)
CO2: 23 mmol/L (ref 22–32)
Calcium: 9.2 mg/dL (ref 8.9–10.3)
Chloride: 103 mmol/L (ref 98–111)
Creatinine: 0.93 mg/dL (ref 0.61–1.24)
GFR, Estimated: >60 mL/min (ref >60)
Glucose, Bld: 118 mg/dL — ABNORMAL HIGH (ref 70–99)
Potassium: 3.8 mmol/L (ref 3.5–5.1)
Sodium: 133 mmol/L — ABNORMAL LOW (ref 135–145)
Total Bilirubin: 0.6 mg/dL (ref 0.3–1.2)
Total Protein: 7.7 g/dL (ref 6.5–8.1)

## 2022-08-01 NOTE — Assessment & Plan Note (Signed)
-  per CT done at [facility], report states fissural and subpleural nodules without changes.  No new or enlarging nodules. Attention on follow-up Cts at [facility]

## 2022-08-01 NOTE — Assessment & Plan Note (Addendum)
#  Low rectal cancer, s/p total neoadjuvant therapy.chemotherapy regimen with FOLFOX Q2w x 4 months, followed by concurrent Xeloda with RT, Status post local excision- ypT1 disease,  February 2023 MRI pelvis, CT chest and abdomen showed no recurrence. Labs reviewed and discussed with patient.  CEA is pending at time of dictation. He is overdue for colonoscopy and image surveillance at [facility], I urge patient to keep his rescheduled appt.  He knows to call our clinic if he prefers to do images locally  he is about 4 years after his rectal CA treatments.

## 2022-08-01 NOTE — Assessment & Plan Note (Signed)
Grade 1, stable symptoms.   

## 2022-08-01 NOTE — Progress Notes (Signed)
Pt here for follow up. No new concerns voiced.   

## 2022-08-01 NOTE — Progress Notes (Signed)
Hematology/Oncology Progress note [phone] [phone]     CHIEF COMPLAINTS/REASON FOR VISIT:  Follow-up for treatment of rectal cancer,  ASSESSMENT & PLAN:   Rectal cancer (HCC) #Low rectal cancer, s/p total neoadjuvant therapy.chemotherapy regimen with FOLFOX Q2w x 4 months, followed by concurrent Xeloda with RT, Status post local excision- ypT1 disease,  February 2023 MRI pelvis, CT chest and abdomen showed no recurrence. Labs reviewed and discussed with patient.  CEA is pending at time of dictation. He is overdue for colonoscopy and image surveillance at [facility], I urge patient to keep his rescheduled appt.  He knows to call our clinic if he prefers to do images locally  he is about 4 years after his rectal CA treatments.    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    Erythrocytosis He has stopped smoking, Hb is elevated today.  Discussed about possibility of sleep apnea due to weight gain.  Advise life style modification No need for phlebotomy. He may donate blood, recommend no more than twice per year.   Lung nodule -per CT done at [facility], report states fissural and subpleural nodules without changes.  No new or enlarging nodules. Attention on follow-up Cts at [facility]  Orders Placed This Encounter  Procedures   CBC with Differential (Cancer Center Only)    Standing Status:   Future    Standing Expiration Date:   08/01/2023   CMP (Cancer Center only)    Standing Status:   Future    Standing Expiration Date:   08/01/2023   CEA    Standing Status:   Future    Standing Expiration Date:   08/01/2023   Follow up in 6 months.  All questions were answered. The patient knows to call the clinic with any problems, questions or concerns.  Rickard Patience, MD, PhD [facility] Hematology Oncology 08/01/2022    HISTORY OF PRESENTING ILLNESS:  Jeffery Mccormick is a  57 y.o.  male presents for follow-up of rectal cancer.  Recently found to  have elevated alkaline phosphatase. Rectal cancer cT3 N0 M0. 12/25/2017 colonoscopy : revealed anal mass, 5-6 cm from the anal verge.  # CT chest abdomen pelvis 01/16/2018 showed Wall thickening involving the lower rectum, corresponding to the patient's known rectal cancer. No findings specific for metastatic disease. Scattered small bilateral pulmonary nodules measuring up to 3 mm, nonspecific but not considered overly suspicious for metastatic disease.  #01/23/2018 MRI pelvis with and without contrast T3N0  #Cancer treatment TNT neoadjuvant protocol #02/09/2018 to 05/18/2018 FOLFOX q. 14 days x 8 #07/10/2018 finished concurrent chemo with Xeloda/radiation  # Low rectal cancer, s/p total neoadjuvant therapy.chemotherapy regimen with FOLFOX Q2w x 4 months, followed by concurrent Xeloda with RT,  Status post local excision- ypT1 disease,  09/25/2018 rectum local excision at [facility] showed minute focus of residual viable adenocarcinoma involving submucosa and superficial muscularis propria. Margins are negative. Patient opted to have local excision without proceeding with APR.  ypT1  #[facility] 04/05/2019 MRI pelvis with and without contrast . No evidence of tumor on current exam.  No evidence of metastatic disease in the pelvis. 04/22/2019, patient had colonoscopy done at [facility].  Patient had 3 small polyps in the sigmoid colon resected.  Scar in the distal rectum.  Biopsied.  Colon polyps are tubular adenoma and hyperplastic polyps.  Rectum biopsy showed hyperplastic polyp.  # 10/04/2019, CT chest abdomen pelvis showed bilateral scattered subcentimeter nodules unchanged from 01/16/2018.  Indeterminate but favored benign etiology  given the stability.  Attention on follow-up.  No new nodules. CT abdomen pelvis showed no evidence of metastatic disease in the abdomen.  # MRI 04/26/2020 Since 07/23/2018, the primary tumor and extramural disease show postsurgical changes with predominantly fibrotic changes  in the anterior rectum. No definite residual enhancing mass with restricted diffusion or enhancement identified.Posteriorly, there is suggestion of mild wall thickening with T2 intermediate to hyperintense signal without diffusion restriction. Findings are favored to represent hemorrhoids seen on the prior colonoscopy as well as the prior MRI. Correlate with recent colonoscopic findings.  Post treatment category: TRG stage 1  # MRI pelvis done at [facility] in January 2022.  No recurrence.  #Port-A-Cath in place, Mediport was removed by Dr. Tonna Boehringer on 01/24/2021  05/21/2021, MRI pelvis with and without contrast showed no identifiable residual tumor.  Similar postsurgical and fibrotic changes along the anterior rectum without residual enhancing mass or diffusion restriction.  Similar intermediate T2 signal along the posterior rectal wall.  No suspicious enhancement or diffusion restriction.  Favored to represent hemorrhoids as seen on prior colonoscopy.  Posttreatment category TRG stage I. 05/21/2021, CT chest with contrast and CT abdomen with contrast showed no metastasis.  INTERVAL HISTORY Jeffery Mccormick is a 57 y.o. male who has above history reviewed by me today presents for rectal cancer cT3 N0 M0, status post total neoadjuvant treatments, status post local excision, ypT1 Patient is overdue for his follow up with [facility] He has intentional weight loss for 5 pounds. Stopped smoking.  Patient denies any blood in the stool or dark stool, nausea vomiting diarrhea, abdominal pain.   Review of Systems  Constitutional:  Negative for chills, fever, malaise/fatigue and weight loss.  HENT:  Negative for nosebleeds and sore throat.   Eyes:  Negative for double vision, photophobia and redness.  Respiratory:  Negative for cough, shortness of breath and wheezing.   Cardiovascular:  Negative for chest pain, palpitations, orthopnea and leg swelling.  Gastrointestinal:  Negative for abdominal pain, blood in stool,  diarrhea, nausea and vomiting.  Genitourinary:  Negative for dysuria.  Musculoskeletal:  Negative for back pain, myalgias and neck pain.  Skin:  Negative for itching and rash.  Neurological:  Negative for dizziness, tingling and tremors.  Endo/Heme/Allergies:  Negative for environmental allergies. Does not bruise/bleed easily.  Psychiatric/Behavioral:  Negative for depression and hallucinations.     MEDICAL HISTORY:  Past Medical History:  Diagnosis Date   Hemorrhoid prolapse     SURGICAL HISTORY: Past Surgical History:  Procedure Laterality Date   COLONOSCOPY WITH PROPOFOL N/A 12/25/2017   Procedure: COLONOSCOPY WITH PROPOFOL;  Surgeon: Sung Amabile, DO;  Location: [facility] ENDOSCOPY;  Service: General;  Laterality: N/A;   HERNIA REPAIR  07/2013   Right Inguinal-Dr. Excell Seltzer   PORTACATH PLACEMENT Right 02/03/2018   Procedure: INSERTION PORT-A-CATH;  Surgeon: Sung Amabile, DO;  Location: [facility] ORS;  Service: General;  Laterality: Right;    SOCIAL HISTORY: Social History   Socioeconomic History   Marital status: Married    Spouse name: Not on file   Number of children: Not on file   Years of education: Not on file   Highest education level: Not on file  Occupational History   Occupation: unemployed  Tobacco Use   Smoking status: Every Day    Packs/day: 1.00    Years: 30.00    Additional pack years: 0.00    Total pack years: 30.00    Types: Cigarettes   Smokeless tobacco: Never  Vaping Use  Vaping Use: Never used  Substance and Sexual Activity   Alcohol use: Yes    Comment: occasionally   Drug use: No   Sexual activity: Not on file  Other Topics Concern   Not on file  Social History Narrative   Not on file   Social Determinants of Health   Financial Resource Strain: Not on file  Food Insecurity: Not on file  Transportation Needs: Not on file  Physical Activity: Not on file  Stress: Not on file  Social Connections: Not on file  Intimate Partner Violence: Not on  file    FAMILY HISTORY: Family History  Family history unknown: Yes  patient reports that he does not know any family history.   ALLERGIES:  has No Known Allergies.  MEDICATIONS:  Current Outpatient Medications  Medication Sig Dispense Refill   lisinopril (ZESTRIL) 20 MG tablet Take 20 mg by mouth daily.     No current facility-administered medications for this visit.   Facility-Administered Medications Ordered in Other Visits  Medication Dose Route Frequency Provider Last Rate Last Admin   heparin lock flush 100 unit/mL  500 Units Intravenous Once Rickard Patience, MD       sodium chloride flush (NS) 0.9 % injection 10 mL  10 mL Intravenous Once Rickard Patience, MD       RADIOGRAPHIC STUDIES: I have personally reviewed the radiological images as listed and agreed with the findings in the report. No results found.   PHYSICAL EXAMINATION: ECOG PERFORMANCE STATUS: 0 - Asymptomatic Vitals:   08/01/22 1015  BP: (!) 151/94  Pulse: 82  Resp: 18  Temp: (!) 97 F (36.1 C)   Filed Weights   08/01/22 1015  Weight: 209 lb 6.4 oz (95 kg)    Physical Exam Constitutional:      General: He is not in acute distress. HENT:     Head: Normocephalic and atraumatic.  Eyes:     General: No scleral icterus.    Pupils: Pupils are equal, round, and reactive to light.  Cardiovascular:     Rate and Rhythm: Normal rate and regular rhythm.     Heart sounds: Normal heart sounds.  Pulmonary:     Effort: Pulmonary effort is normal. No respiratory distress.     Breath sounds: No wheezing.  Abdominal:     General: Bowel sounds are normal. There is no distension.     Palpations: Abdomen is soft. There is no mass.     Tenderness: There is no abdominal tenderness.  Musculoskeletal:        General: No deformity. Normal range of motion.     Cervical back: Normal range of motion and neck supple.  Skin:    General: Skin is warm and dry.     Findings: No erythema or rash.  Neurological:     Mental Status:  He is alert and oriented to person, place, and time. Mental status is at baseline.     Cranial Nerves: No cranial nerve deficit.     Coordination: Coordination normal.  Psychiatric:        Mood and Affect: Mood normal.        Thought Content: Thought content normal.      LABORATORY DATA:  I have reviewed the data as listed    Latest Ref Rng & Units 08/01/2022    9:55 AM 01/30/2022   10:40 AM 09/25/2021   10:01 AM  CBC  WBC 4.0 - 10.5 K/uL 10.7  7.0  7.4   Hemoglobin  13.0 - 17.0 g/dL 45.4  09.8  11.9   Hematocrit 39.0 - 52.0 % 50.9  47.3  48.1   Platelets 150 - 400 K/uL 206  226  214       Latest Ref Rng & Units 08/01/2022    9:56 AM 01/30/2022   10:40 AM 09/25/2021   10:01 AM  CMP  Glucose 70 - 99 mg/dL 147  829  562   BUN 6 - 20 mg/dL Creatinine 0.61 - 1.24 mg/dL 1.30  8.65  7.84   Sodium 135 - 145 mmol/L 133  135  134   Potassium 3.5 - 5.1 mmol/L 3.8  4.1  4.1   Chloride 98 - 111 mmol/L 103  104  105   CO2 22 - 32 mmol/L Calcium 8.9 - 10.3 mg/dL 9.2  9.3  9.0   Total Protein 6.5 - 8.1 g/dL 7.7  8.4  7.9   Total Bilirubin 0.3 - 1.2 mg/dL 0.6  0.7  0.8   Alkaline Phos 38 - 126 U/L 112  101  107   AST 15 - 41 U/L ALT 0 - 44 U/L 27  35  38    01/29/2018 baseline CEA 11.6  RADIOGRAPHIC STUDIES: I have personally reviewed the radiological images as listed and agreed with the findings in the report. [facility] surveillance imaging results were reviewed.

## 2022-08-01 NOTE — Assessment & Plan Note (Signed)
He has stopped smoking, Hb is elevated today.  Discussed about possibility of sleep apnea due to weight gain.  Advise life style modification No need for phlebotomy. He may donate blood, recommend no more than twice per year.

## 2022-08-03 LAB — CEA: CEA: 3 ng/mL (ref 0.0–4.7)

## 2022-08-15 ENCOUNTER — Ambulatory Visit (INDEPENDENT_AMBULATORY_CARE_PROVIDER_SITE_OTHER): Payer: Medicare Other | Admitting: Dermatology

## 2022-08-15 VITALS — BP 143/88

## 2022-08-15 DIAGNOSIS — B079 Viral wart, unspecified: Secondary | ICD-10-CM

## 2022-08-15 DIAGNOSIS — Z7189 Other specified counseling: Secondary | ICD-10-CM | POA: Diagnosis not present

## 2022-08-15 MED ORDER — IMIQUIMOD 5 % EX CREA
TOPICAL_CREAM | Freq: Every day | CUTANEOUS | 2 refills | Status: DC
Start: 2022-08-15 — End: 2022-11-14

## 2022-08-15 NOTE — Progress Notes (Signed)
   New Patient Visit   Subjective  Jeffery Mccormick is a 57 y.o. male who presents for the following: Biopsy proven wart of left oral commissure x 2 years. He has warts on hands and thinks this is how they keep spreading. He is treating it with OTC liquid wart remover.  Accompanied by wife  The following portions of the chart were reviewed this encounter and updated as appropriate: medications, allergies, medical history  Review of Systems:  No other skin or systemic complaints except as noted in HPI or Assessment and Plan.  Objective  Well appearing patient in no apparent distress; mood and affect are within normal limits. A focused examination was performed of the following areas: Face, hands Relevant exam findings are noted in the Assessment and Plan.  Left Oral Commissure x 1, hands x 7 (8) 1.3 cm verrucous plaque of left oral commissure. Oral mucosa is clear today.   Assessment & Plan    Viral warts, unspecified type (8) Left Oral Commissure x 1, hands x 7  Biopsy proven by dentist 10/04/2020  WART Exam: verrucous papule(s)  Counseling Discussed viral / HPV (Human Papilloma Virus) etiology and risk of spread /infectivity to other areas of body as well as to other people.  Multiple treatments and methods may be required to clear warts and it is possible treatment may not be successful.  Treatment risks include discoloration; scarring and there is still potential for wart recurrence.  Treatment Plan: Discussed viral etiology and contagion.   Discussed treatment options. Advised to discontinue liquid wart remover. Start Imiquimod 5% qhs 3 times per week.  Destruction of lesion - Left Oral Commissure x 1, hands x 7 Complexity: simple   Destruction method: cryotherapy   Informed consent: discussed and consent obtained   Timeout:  patient name, date of birth, surgical site, and procedure verified Lesion destroyed using liquid nitrogen: Yes   Region frozen until ice ball  extended beyond lesion: Yes   Outcome: patient tolerated procedure well with no complications   Post-procedure details: wound care instructions given   Additional details:  3% Squaric acid applied to left upper arm and left palm  imiquimod (ALDARA) 5 % cream - Left Oral Commissure x 1, hands x 7 Apply topically at bedtime. 3 times per week    Return for 2-3 months , Wart Follow up.  I, Joanie Coddington, CMA, am acting as scribe for Armida Sans, MD .  Documentation: I have reviewed the above documentation for accuracy and completeness, and I agree with the above.  Armida Sans, MD

## 2022-08-15 NOTE — Patient Instructions (Signed)
Cryotherapy Aftercare  Wash gently with soap and water everyday.   Apply Vaseline and Band-Aid daily until healed.     Due to recent changes in healthcare laws, you may see results of your pathology and/or laboratory studies on MyChart before the doctors have had a chance to review them. We understand that in some cases there may be results that are confusing or concerning to you. Please understand that not all results are received at the same time and often the doctors may need to interpret multiple results in order to provide you with the best plan of care or course of treatment. Therefore, we ask that you please give us 2 business days to thoroughly review all your results before contacting the office for clarification. Should we see a critical lab result, you will be contacted sooner.   If You Need Anything After Your Visit  If you have any questions or concerns for your doctor, please call our main line at [phone] and press option 4 to reach your doctor's medical assistant. If no one answers, please leave a voicemail as directed and we will return your call as soon as possible. Messages left after 4 pm will be answered the following business day.   You may also send us a message via MyChart. We typically respond to MyChart messages within 1-2 business days.  For prescription refills, please ask your pharmacy to contact our office. Our fax number is [phone].  If you have an urgent issue when the clinic is closed that cannot wait until the next business day, you can page your doctor at the number below.    Please note that while we do our best to be available for urgent issues outside of office hours, we are not available 24/7.   If you have an urgent issue and are unable to reach us, you may choose to seek medical care at your doctor's office, retail clinic, urgent care center, or emergency room.  If you have a medical emergency, please immediately call 911 or go to the  emergency department.  Pager Numbers  - Dr. Kowalski: [phone]  - Dr. Moye: [phone]  - Dr. Stewart: [phone]  In the event of inclement weather, please call our main line at [phone] for an update on the status of any delays or closures.  Dermatology Medication Tips: Please keep the boxes that topical medications come in in order to help keep track of the instructions about where and how to use these. Pharmacies typically print the medication instructions only on the boxes and not directly on the medication tubes.   If your medication is too expensive, please contact our office at [phone] option 4 or send us a message through MyChart.   We are unable to tell what your co-pay for medications will be in advance as this is different depending on your insurance coverage. However, we may be able to find a substitute medication at lower cost or fill out paperwork to get insurance to cover a needed medication.   If a prior authorization is required to get your medication covered by your insurance company, please allow us 1-2 business days to complete this process.  Drug prices often vary depending on where the prescription is filled and some pharmacies may offer cheaper prices.  The website www.goodrx.com contains coupons for medications through different pharmacies. The prices here do not account for what the cost may be with help from insurance (it may be cheaper with your insurance), but the website can   give you the price if you did not use any insurance.  - You can print the associated coupon and take it with your prescription to the pharmacy.  - You may also stop by our office during regular business hours and pick up a GoodRx coupon card.  - If you need your prescription sent electronically to a different pharmacy, notify our office through [facility] MyChart or by phone at [phone] option 4.     Si Usted Necesita Algo Despus de Su Visita  Tambin puede  enviarnos un mensaje a travs de MyChart. Por lo general respondemos a los mensajes de MyChart en el transcurso de 1 a 2 das hbiles.  Para renovar recetas, por favor pida a su farmacia que se ponga en contacto con nuestra oficina. Nuestro nmero de fax es el [phone].  Si tiene un asunto urgente cuando la clnica est cerrada y que no puede esperar hasta el siguiente da hbil, puede llamar/localizar a su doctor(a) al nmero que aparece a continuacin.   Por favor, tenga en cuenta que aunque hacemos todo lo posible para estar disponibles para asuntos urgentes fuera del horario de oficina, no estamos disponibles las 24 horas del da, los 7 das de la semana.   Si tiene un problema urgente y no puede comunicarse con nosotros, puede optar por buscar atencin mdica  en el consultorio de su doctor(a), en una clnica privada, en un centro de atencin urgente o en una sala de emergencias.  Si tiene una emergencia mdica, por favor llame inmediatamente al 911 o vaya a la sala de emergencias.  Nmeros de bper  - Dr. Kowalski: [phone]  - Dra. Moye: [phone]  - Dra. Stewart: [phone]  En caso de inclemencias del tiempo, por favor llame a nuestra lnea principal al [phone] para una actualizacin sobre el estado de cualquier retraso o cierre.  Consejos para la medicacin en dermatologa: Por favor, guarde las cajas en las que vienen los medicamentos de uso tpico para ayudarle a seguir las instrucciones sobre dnde y cmo usarlos. Las farmacias generalmente imprimen las instrucciones del medicamento slo en las cajas y no directamente en los tubos del medicamento.   Si su medicamento es muy caro, por favor, pngase en contacto con nuestra oficina llamando al [phone] y presione la opcin 4 o envenos un mensaje a travs de MyChart.   No podemos decirle cul ser su copago por los medicamentos por adelantado ya que esto es diferente dependiendo de la cobertura de su seguro.  Sin embargo, es posible que podamos encontrar un medicamento sustituto a menor costo o llenar un formulario para que el seguro cubra el medicamento que se considera necesario.   Si se requiere una autorizacin previa para que su compaa de seguros cubra su medicamento, por favor permtanos de 1 a 2 das hbiles para completar este proceso.  Los precios de los medicamentos varan con frecuencia dependiendo del lugar de dnde se surte la receta y alguna farmacias pueden ofrecer precios ms baratos.  El sitio web www.goodrx.com tiene cupones para medicamentos de diferentes farmacias. Los precios aqu no tienen en cuenta lo que podra costar con la ayuda del seguro (puede ser ms barato con su seguro), pero el sitio web puede darle el precio si no utiliz ningn seguro.  - Puede imprimir el cupn correspondiente y llevarlo con su receta a la farmacia.  - Tambin puede pasar por nuestra oficina durante el horario de atencin regular y recoger una tarjeta de cupones de GoodRx.  -   Si necesita que su receta se enve electrnicamente a una farmacia diferente, informe a nuestra oficina a travs de MyChart de [facility] o por telfono llamando al [phone] y presione la opcin 4.  

## 2022-08-24 ENCOUNTER — Encounter: Payer: Self-pay | Admitting: Dermatology

## 2022-09-26 ENCOUNTER — Other Ambulatory Visit: Payer: Medicare Other

## 2022-09-26 ENCOUNTER — Ambulatory Visit: Payer: Medicare Other | Admitting: Oncology

## 2022-11-14 ENCOUNTER — Ambulatory Visit: Payer: Medicare Other | Admitting: Dermatology

## 2022-11-14 DIAGNOSIS — B079 Viral wart, unspecified: Secondary | ICD-10-CM

## 2022-11-14 DIAGNOSIS — Z79899 Other long term (current) drug therapy: Secondary | ICD-10-CM

## 2022-11-14 DIAGNOSIS — B078 Other viral warts: Secondary | ICD-10-CM

## 2022-11-14 DIAGNOSIS — L253 Unspecified contact dermatitis due to other chemical products: Secondary | ICD-10-CM

## 2022-11-14 DIAGNOSIS — L720 Epidermal cyst: Secondary | ICD-10-CM | POA: Diagnosis not present

## 2022-11-14 DIAGNOSIS — Z7189 Other specified counseling: Secondary | ICD-10-CM | POA: Diagnosis not present

## 2022-11-14 DIAGNOSIS — L235 Allergic contact dermatitis due to other chemical products: Secondary | ICD-10-CM

## 2022-11-14 DIAGNOSIS — L729 Follicular cyst of the skin and subcutaneous tissue, unspecified: Secondary | ICD-10-CM

## 2022-11-14 MED ORDER — IMIQUIMOD 5 % EX CREA
TOPICAL_CREAM | Freq: Every day | CUTANEOUS | 2 refills | Status: DC
Start: 2022-11-14 — End: 2023-06-05

## 2022-11-14 NOTE — Patient Instructions (Addendum)
Pre-Operative Instructions  You are scheduled for a surgical procedure at [facility]. We recommend you read the following instructions. If you have any questions or concerns, please call the office at [phone].  Shower and wash the entire body with soap and water the day of your surgery paying special attention to cleansing at and around the planned surgery site.  Avoid aspirin or aspirin containing products at least fourteen (14) days prior to your surgical procedure and for at least one week (7 Days) after your surgical procedure. If you take aspirin on a regular basis for heart disease or history of stroke or for any other reason, we may recommend you continue taking aspirin but please notify us if you take this on a regular basis. Aspirin can cause more bleeding to occur during surgery as well as prolonged bleeding and bruising after surgery.   Avoid other nonsteroidal pain medications at least one week prior to surgery and at least one week prior to your surgery. These include medications such as Ibuprofen (Motrin, Advil and Nuprin), Naprosyn, Voltaren, Relafen, etc. If medications are used for therapeutic reasons, please inform us as they can cause increased bleeding or prolonged bleeding during and bruising after surgical procedures.   Please advise [place] if you are taking any "blood thinner" medications such as Coumadin or Dipyridamole or Plavix or similar medications. These cause increased bleeding and prolonged bleeding during procedures and bruising after surgical procedures. We may have to consider discontinuing these medications briefly prior to and shortly after your surgery if safe to do so.   Please inform us of all medications you are currently taking. All medications that are taken regularly should be taken the day of surgery as you always do. Nevertheless, we need to be informed of what medications you are taking prior to surgery to know whether they will affect the  procedure or cause any complications.   Please inform us of any medication allergies. Also inform us of whether you have allergies to Latex or rubber products or whether you have had any adverse reaction to Lidocaine or Epinephrine.  Please inform us of any prosthetic or artificial body parts such as artificial heart valve, joint replacements, etc., or similar condition that might require preoperative antibiotics.   We recommend avoidance of alcohol at least two weeks prior to surgery and continued avoidance for at least two weeks after surgery.   We recommend discontinuation of tobacco smoking at least two weeks prior to surgery and continued abstinence for at least two weeks after surgery.  Do not plan strenuous exercise, strenuous work or strenuous lifting for approximately four weeks after your surgery.   We request if you are unable to make your scheduled surgical appointment, please call us at least a week in advance or as soon as you are aware of a problem so that we can cancel or reschedule the appointment.   You MAY TAKE TYLENOL (acetaminophen) for pain as it is not a blood thinner.   PLEASE PLAN TO BE IN TOWN FOR TWO WEEKS FOLLOWING SURGERY, THIS IS IMPORTANT SO YOU CAN BE CHECKED FOR DRESSING CHANGES, SUTURE REMOVAL AND TO MONITOR FOR POSSIBLE COMPLICATIONS.        Instructions for After In-Office Application of Cantharidin  1. This is a strong medicine; please follow ALL instructions.  2. Gently wash off with soap and water in four hours or sooner s directed by your physician.  3. **WARNING** this medicine can cause severe blistering, blood blisters, infection,  and/or scarring if it is not washed off as directed.  4. Your progress will be rechecked in 1-2 months; call sooner if there are any questions or problems.      Due to recent changes in healthcare laws, you may see results of your pathology and/or laboratory studies on MyChart before the doctors have had a  chance to review them. We understand that in some cases there may be results that are confusing or concerning to you. Please understand that not all results are received at the same time and often the doctors may need to interpret multiple results in order to provide you with the best plan of care or course of treatment. Therefore, we ask that you please give [place] 2 business days to thoroughly review all your results before contacting the office for clarification. Should we see a critical lab result, you will be contacted sooner.   If You Need Anything After Your Visit  If you have any questions or concerns for your doctor, please call our main line at [phone] and press option 4 to reach your doctor's medical assistant. If no one answers, please leave a voicemail as directed and we will return your call as soon as possible. Messages left after 4 pm will be answered the following business day.   You may also send [place] a message via MyChart. We typically respond to MyChart messages within 1-2 business days.  For prescription refills, please ask your pharmacy to contact our office. Our fax number is [phone].  If you have an urgent issue when the clinic is closed that cannot wait until the next business day, you can page your doctor at the number below.    Please note that while we do our best to be available for urgent issues outside of office hours, we are not available 24/7.   If you have an urgent issue and are unable to reach [place], you may choose to seek medical care at your doctor's office, retail clinic, urgent care center, or emergency room.  If you have a medical emergency, please immediately call 911 or go to the emergency department.  Pager Numbers  - Dr. Gwen Pounds: [phone]  - Dr. Roseanne Reno: [phone]  In the event of inclement weather, please call our main line at [phone] for an update on the status of any delays or closures.  Dermatology Medication Tips: Please keep  the boxes that topical medications come in in order to help keep track of the instructions about where and how to use these. Pharmacies typically print the medication instructions only on the boxes and not directly on the medication tubes.   If your medication is too expensive, please contact our office at [phone] option 4 or send [place] a message through MyChart.   We are unable to tell what your co-pay for medications will be in advance as this is different depending on your insurance coverage. However, we may be able to find a substitute medication at lower cost or fill out paperwork to get insurance to cover a needed medication.   If a prior authorization is required to get your medication covered by your insurance company, please allow [place] 1-2 business days to complete this process.  Drug prices often vary depending on where the prescription is filled and some pharmacies may offer cheaper prices.  The website www.goodrx.com contains coupons for medications through different pharmacies. The prices here do not account for what the cost may be with help from insurance (it may be cheaper  with your insurance), but the website can give you the price if you did not use any insurance.  - You can print the associated coupon and take it with your prescription to the pharmacy.  - You may also stop by our office during regular business hours and pick up a GoodRx coupon card.  - If you need your prescription sent electronically to a different pharmacy, notify our office through [facility] or by phone at [phone] option 4.     Si Usted Necesita Algo Despus de Su Visita  Tambin puede enviarnos un mensaje a travs de Clinical cytogeneticist. Por lo general respondemos a los mensajes de MyChart en el transcurso de 1 a 2 das hbiles.  Para renovar recetas, por favor pida a su farmacia que se ponga en contacto con nuestra oficina. Annie Sable de fax es [place] [phone].  Si tiene un asunto urgente cuando  la clnica est cerrada y que no puede esperar hasta el siguiente da hbil, puede llamar/localizar a su doctor(a) al nmero que aparece a continuacin.   Por favor, tenga en cuenta que aunque hacemos todo lo posible para estar disponibles para asuntos urgentes fuera del horario de [place], no estamos disponibles las 24 horas del da, los 7 [address]  [address] de la [place].   Si tiene un problema urgente y no puede comunicarse con nosotros, puede optar por buscar atencin mdica  en el consultorio de su doctor(a), en una clnica privada, en un centro de atencin urgente o en una sala de emergencias.  Si tiene Engineer, drilling, por favor llame inmediatamente al 911 o vaya a la sala de emergencias.  Nmeros de bper  - Dr. Gwen Pounds: [phone]  - Dra. Roseanne Reno: [phone]  En caso de inclemencias del [place], por favor llame a Lacy Duverney principal al [phone] para una actualizacin sobre el [place] de cualquier retraso o cierre.  Consejos para la medicacin en dermatologa: Por favor, guarde las cajas en las que vienen los medicamentos de uso tpico para ayudarle a seguir las instrucciones sobre dnde y cmo usarlos. Las farmacias generalmente imprimen las instrucciones del medicamento slo en las cajas y no directamente en los tubos del [place].   Si su medicamento es muy caro, por favor, pngase en contacto con Rolm Gala llamando al [phone] y presione la opcin 4 o envenos un mensaje a travs de Clinical cytogeneticist.   No podemos decirle cul ser su copago por los medicamentos por adelantado ya que esto es diferente dependiendo de la cobertura de su seguro. Sin embargo, es posible que podamos encontrar un medicamento sustituto a Audiological scientist un formulario para que el seguro cubra el medicamento que se considera necesario.   Si se requiere una autorizacin previa para que su compaa de seguros [place] su medicamento, por favor permtanos de 1 a 2 das hbiles para completar [address]  Los precios de los medicamentos varan con frecuencia dependiendo del Environmental consultant de dnde se surte la receta y alguna farmacias pueden ofrecer precios ms baratos.  El sitio web www.goodrx.com tiene cupones para medicamentos de Health and safety inspector. Los precios aqu no tienen en cuenta lo que podra costar con la ayuda del seguro (puede ser ms barato con su seguro), pero el sitio web puede darle el precio si no utiliz Tourist information centre manager.  - Puede imprimir el cupn correspondiente y llevarlo con su receta a la farmacia.  - Tambin puede pasar por nuestra oficina durante el horario de atencin regular y Education officer, museum una tarjeta de cupones de GoodRx.  -  Si necesita que su receta se enve electrnicamente a Psychiatrist, informe a nuestra oficina a travs de MyChart de [place] o por telfono llamando al [phone] y presione la opcin 4.

## 2022-11-14 NOTE — Progress Notes (Signed)
Follow-Up Visit   Subjective  Jeffery Mccormick is a 57 y.o. male who presents for the following: 4 months f/u on Warts on the hands, past treatment LN2 helped in the past, patient had squaric acid sensitization on the left arm 4 months ago with a good response, patient also treated with Aldara cream with a good response.   The patient has spots, moles and lesions to be evaluated, some may be new or changing and the patient may have concern these could be cancer.   Wife is with patient and contributes to history.   The following portions of the chart were reviewed this encounter and updated as appropriate: medications, allergies, medical history  Review of Systems:  No other skin or systemic complaints except as noted in HPI or Assessment and Plan.  Objective  Well appearing patient in no apparent distress; mood and affect are within normal limits.  Areas Examined: Face,hands   Relevant physical exam findings are noted in the Assessment and Plan.  left palm x 6, right palm x 5 (11) Verrucous papules -- Discussed viral etiology and contagion.     Assessment & Plan   Other viral warts (11) left palm x 6, right palm x 5  Viral Wart (HPV) Counseling  Discussed viral / HPV (Human Papilloma Virus) etiology and risk of spread /infectivity to other areas of body as well as to other people.  Multiple treatments and methods may be required to clear warts and it is possible treatment may not be successful.  Treatment risks include discoloration; scarring and there is still potential for wart recurrence.   Squaric Acid 3% applied to warts today. Prior to application reviewed risk of inflammation and irritation.  Cantharidin Plus is a blistering agent that comes from a beetle.  It needs to be washed off in about 4 hours after application.  Although it is painless when applied in office, it may cause symptoms of mild pain and burning several hours later.  Treated areas will swell and turn red,  and blisters may form.  Vaseline and a bandaid may be applied until wound has healed.  Once healed, the skin may remain temporarily discolored.  It can take weeks to months for pigmentation to return to normal.  Advised to wash off with soap and water in 4 hours or sooner if it becomes tender before then.   Destruction of lesion - left palm x 6, right palm x 5 (11) Complexity: simple   Destruction method: cryotherapy   Informed consent: discussed and consent obtained   Timeout:  patient name, date of birth, surgical site, and procedure verified Lesion destroyed using liquid nitrogen: Yes   Region frozen until ice ball extended beyond lesion: Yes   Outcome: patient tolerated procedure well with no complications   Post-procedure details: wound care instructions given    Destruction of lesion - left palm x 6, right palm x 5 (11)  Destruction method: chemical removal   Destruction method comment:  Squaric acid 3% Informed consent: discussed and consent obtained   Timeout:  patient name, date of birth, surgical site, and procedure verified Chemical destruction method: cantharidin   Procedure instructions: patient instructed to wash and dry area   Outcome: patient tolerated procedure well with no complications   Post-procedure details: wound care instructions given   Additional details:  Patient advised to set alarm to remind them to wash off with soap and water at the directed time.  Viral warts, unspecified type  Related  Medications imiquimod (ALDARA) 5 % cream Apply topically at bedtime. 3 times per week  Restart Start Imiquimod 5% qhs 3 times per week.    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 Exam most consistent with an epidermal inclusion cyst. Discussed that a cyst is a benign growth that can grow over time and sometimes get  irritated or inflamed. Recommend observation if it is not bothersome. Discussed option of surgical excision to remove it if it is growing, symptomatic, or other changes noted. Please call for new or changing lesions so they can be evaluated.  Schedule surgery    I, Angelique Holm, CMA, am acting as scribe for Armida Sans, MD .   Documentation: I have reviewed the above documentation for accuracy and completeness, and I agree with the above.  Armida Sans, MD

## 2022-11-25 ENCOUNTER — Encounter: Payer: Self-pay | Admitting: Dermatology

## 2022-11-26 ENCOUNTER — Encounter: Payer: Self-pay | Admitting: Dermatology

## 2022-11-26 ENCOUNTER — Ambulatory Visit: Payer: Medicare Other | Admitting: Dermatology

## 2022-11-26 ENCOUNTER — Telehealth: Payer: Self-pay

## 2022-11-26 DIAGNOSIS — L720 Epidermal cyst: Secondary | ICD-10-CM | POA: Diagnosis not present

## 2022-11-26 DIAGNOSIS — D485 Neoplasm of uncertain behavior of skin: Secondary | ICD-10-CM

## 2022-11-26 MED ORDER — MUPIROCIN 2 % EX OINT
1.0000 | TOPICAL_OINTMENT | Freq: Every day | CUTANEOUS | 0 refills | Status: DC
Start: 2022-11-26 — End: 2023-08-01

## 2022-11-26 NOTE — Progress Notes (Signed)
   Follow-Up Visit   Subjective  Jeffery Mccormick is a 57 y.o. male who presents for the following: Excision of cyst vs other of upper back  The following portions of the chart were reviewed this encounter and updated as appropriate: medications, allergies, medical history  Review of Systems:  No other skin or systemic complaints except as noted in HPI or Assessment and Plan.  Objective  Well appearing patient in no apparent distress; mood and affect are within normal limits.  A focused examination was performed of the following areas: Back   Relevant physical exam findings are noted in the Assessment and Plan.   Upper back Cystic papule    Assessment & Plan   Neoplasm of uncertain behavior of skin Upper back  Skin excision  Lesion length (cm):  1.2 Lesion width (cm):  1.2 Margin per side (cm):  0 Total excision diameter (cm):  1.2 Informed consent: discussed and consent obtained   Timeout: patient name, date of birth, surgical site, and procedure verified   Procedure prep:  Patient was prepped and draped in usual sterile fashion Prep type:  Isopropyl alcohol and povidone-iodine Anesthesia: the lesion was anesthetized in a standard fashion   Anesthetic:  1% lidocaine w/ epinephrine 1-100,000 buffered w/ 8.4% NaHCO3 Instrument used: #15 blade   Hemostasis achieved with: pressure   Hemostasis achieved with comment:  Electrocautery Outcome: patient tolerated procedure well with no complications   Post-procedure details: sterile dressing applied and wound care instructions given   Dressing type: bandage and pressure dressing (mupirocin)    Skin repair Complexity:  Complex Final length (cm):  2 Reason for type of repair: reduce tension to allow closure, reduce the risk of dehiscence, infection, and necrosis, reduce subcutaneous dead space and avoid a hematoma, allow closure of the large defect, preserve normal anatomy, preserve normal anatomical and functional relationships  and enhance both functionality and cosmetic results   Undermining: area extensively undermined   Undermining comment:  Undermining defect 1.2 cm Subcutaneous layers (deep stitches):  Suture size:  2-0 Suture type: Vicryl (polyglactin 910)   Subcutaneous suture technique: inverted dermal. Fine/surface layer approximation (top stitches):  Suture size:  2-0 Suture type: nylon   Stitches: simple running   Suture removal (days):  7 Hemostasis achieved with: suture and pressure Outcome: patient tolerated procedure well with no complications   Post-procedure details: sterile dressing applied and wound care instructions given   Dressing type: bandage and pressure dressing (mupirocin)    mupirocin ointment (BACTROBAN) 2 % Apply 1 Application topically daily. With dressing changes  Specimen 1 - Surgical pathology Differential Diagnosis: Cyst vs other  Check Margins: No  Targadox 50 mg 2 po every day with food and plenty of fluid x 5 days - samples given  Mupirocin ointment every day with dressing change      Return in about 1 week (around 12/03/2022) for suture removal.  I, Joanie Coddington, CMA, am acting as scribe for Armida Sans, MD .   Documentation: I have reviewed the above documentation for accuracy and completeness, and I agree with the above.  Armida Sans, MD

## 2022-11-26 NOTE — Patient Instructions (Signed)
 Wound Care Instructions  On the day following your surgery, you should begin doing daily dressing changes: Remove the old dressing and discard it. Cleanse the wound gently with tap water. This may be done in the shower or by placing a wet gauze pad directly on the wound and letting it soak for several minutes. It is important to gently remove any dried blood from the wound in order to encourage healing. This may be done by gently rolling a moistened Q-tip on the dried blood. Do not pick at the wound. If the wound should start to bleed, continue cleaning the wound, then place a moist gauze pad on the wound and hold pressure for a few minutes.  Make sure you then dry the skin surrounding the wound completely or the tape will not stick to the skin. Do not use cotton balls on the wound. After the wound is clean and dry, apply the ointment gently with a Q-tip. Cut a non-stick pad to fit the size of the wound. Lay the pad flush to the wound. If the wound is draining, you may want to reinforce it with a small amount of gauze on top of the non-stick pad for a little added compression to the area. Use the tape to seal the area completely. Select from the following with respect to your individual situation: If your wound has been stitched closed: continue the above steps 1-8 at least daily until your sutures are removed. If your wound has been left open to heal: continue steps 1-8 at least daily for the first 3-4 weeks. We would like for you to take a few extra precautions for at least the next week. Sleep with your head elevated on pillows if our wound is on your head. Do not bend over or lift heavy items to reduce the chance of elevated blood pressure to the wound Do not participate in particularly strenuous activities.   Below is a list of dressing supplies you might need.  Cotton-tipped applicators - Q-tips Gauze pads (2x2 and/or 4x4) - All-Purpose Sponges Non-stick dressing material - Telfa Tape -  Paper or Hypafix New and clean tube of petroleum jelly - Vaseline    Comments on Post-Operative Period Slight swelling and redness often appear around the wound. This is normal and will disappear within several days following the surgery. The healing wound will drain a brownish-red-yellow discharge during healing. This is a normal phase of wound healing. As the wound begins to heal, the drainage may increase in amount. Again, this drainage is normal. Notify us if the drainage becomes persistently bloody, excessively swollen, or intensely painful or develops a foul odor or red streaks.  If you should experience mild discomfort during the healing phase, you may take an aspirin-free medication such as Tylenol (acetaminophen). Notify us if the discomfort is severe or persistent. Avoid alcoholic beverages when taking pain medicine.  In Case of Wound Hemorrhage A wound hemorrhage is when the bandage suddenly becomes soaked with bright red blood and flows profusely. If this happens, sit down or lie down with your head elevated. If the wound has a dressing on it, do not remove the dressing. Apply pressure to the existing gauze. If the wound is not covered, use a gauze pad to apply pressure and continue applying the pressure for 20 minutes without peeking. DO NOT COVER THE WOUND WITH A LARGE TOWEL OR WASH CLOTH. Release your hand from the wound site but do not remove the dressing. If the bleeding has stopped,  gently clean around the wound. Leave the dressing in place for 24 hours if possible. This wait time allows the blood vessels to close off so that you do not spark a new round of bleeding by disrupting the newly clotted blood vessels with an immediate dressing change. If the bleeding does not subside, continue to hold pressure. If matters are out of your control, contact an After Hours clinic or go to the Emergency Room.    Due to recent changes in healthcare laws, you may see results of your pathology  and/or laboratory studies on MyChart before the doctors have had a chance to review them. We understand that in some cases there may be results that are confusing or concerning to you. Please understand that not all results are received at the same time and often the doctors may need to interpret multiple results in order to provide you with the best plan of care or course of treatment. Therefore, we ask that you please give [place] 2 business days to thoroughly review all your results before contacting the office for clarification. Should we see a critical lab result, you will be contacted sooner.   If You Need Anything After Your Visit  If you have any questions or concerns for your doctor, please call our main line at [phone] and press option 4 to reach your doctor's medical assistant. If no one answers, please leave a voicemail as directed and we will return your call as soon as possible. Messages left after 4 pm will be answered the following business day.   You may also send [place] a message via MyChart. We typically respond to MyChart messages within 1-2 business days.  For prescription refills, please ask your pharmacy to contact our office. Our fax number is [phone].  If you have an urgent issue when the clinic is closed that cannot wait until the next business day, you can page your doctor at the number below.    Please note that while we do our best to be available for urgent issues outside of office hours, we are not available 24/7.   If you have an urgent issue and are unable to reach [place], you may choose to seek medical care at your doctor's office, retail clinic, urgent care center, or emergency room.  If you have a medical emergency, please immediately call 911 or go to the emergency department.  Pager Numbers  - Dr. Gwen Pounds: [phone]  - Dr. Roseanne Reno: [phone]  - Dr. Katrinka Blazing: [phone]   In the event of inclement weather, please call our main line at [phone]  for an update on the status of any delays or closures.  Dermatology Medication Tips: Please keep the boxes that topical medications come in in order to help keep track of the instructions about where and how to use these. Pharmacies typically print the medication instructions only on the boxes and not directly on the medication tubes.   If your medication is too expensive, please contact our office at [phone] option 4 or send [place] a message through MyChart.   We are unable to tell what your co-pay for medications will be in advance as this is different depending on your insurance coverage. However, we may be able to find a substitute medication at lower cost or fill out paperwork to get insurance to cover a needed medication.   If a prior authorization is required to get your medication covered by your insurance company, please allow [place] 1-2 business days to complete this  process.  Drug prices often vary depending on where the prescription is filled and some pharmacies may offer cheaper prices.  The website www.goodrx.com contains coupons for medications through different pharmacies. The prices here do not account for what the cost may be with help from insurance (it may be cheaper with your insurance), but the website can give you the price if you did not use any insurance.  - You can print the associated coupon and take it with your prescription to the pharmacy.  - You may also stop by our office during regular business hours and pick up a GoodRx coupon card.  - If you need your prescription sent electronically to a different pharmacy, notify our office through [facility] or by phone at [phone] option 4.     Si Usted Necesita Algo Despus de Su Visita  Tambin puede enviarnos un mensaje a travs de Clinical cytogeneticist. Por lo general respondemos a los mensajes de MyChart en el transcurso de 1 a 2 das hbiles.  Para renovar recetas, por favor pida a su farmacia que se ponga en contacto  con nuestra oficina. Annie Sable de fax es [place] [phone].  Si tiene un asunto urgente cuando la clnica est cerrada y que no puede esperar hasta el siguiente da hbil, puede llamar/localizar a su doctor(a) al nmero que aparece a continuacin.   Por favor, tenga en cuenta que aunque hacemos todo lo posible para estar disponibles para asuntos urgentes fuera del horario de [place], no estamos disponibles las 24 horas del da, los 7 [address]  [address] de la [place].   Si tiene un problema urgente y no puede comunicarse con nosotros, puede optar por buscar atencin mdica  en el consultorio de su doctor(a), en una clnica privada, en un centro de atencin urgente o en una sala de emergencias.  Si tiene Engineer, drilling, por favor llame inmediatamente al 911 o vaya a la sala de emergencias.  Nmeros de bper  - Dr. Gwen Pounds: [phone]  - Dra. Roseanne Reno: [phone]  - Dr. Katrinka Blazing: [phone]   En caso de inclemencias del tiempo, por favor llame a Lacy Duverney principal al [phone] para una actualizacin sobre el [place] de cualquier retraso o cierre.  Consejos para la medicacin en dermatologa: Por favor, guarde las cajas en las que vienen los medicamentos de uso tpico para ayudarle a seguir las instrucciones sobre dnde y cmo usarlos. Las farmacias generalmente imprimen las instrucciones del medicamento slo en las cajas y no directamente en los tubos del [place].   Si su medicamento es muy caro, por favor, pngase en contacto con Rolm Gala llamando al [phone] y presione la opcin 4 o envenos un mensaje a travs de Clinical cytogeneticist.   No podemos decirle cul ser su copago por los medicamentos por adelantado ya que esto es diferente dependiendo de la cobertura de su seguro. Sin embargo, es posible que podamos encontrar un medicamento sustituto a Audiological scientist un formulario para que el seguro cubra el medicamento que se considera necesario.   Si se requiere una autorizacin  previa para que su compaa de seguros [place] su medicamento, por favor permtanos de 1 a 2 das hbiles para completar [address]  Los precios de los medicamentos varan con frecuencia dependiendo del Environmental consultant de dnde se surte la receta y alguna farmacias pueden ofrecer precios ms baratos.  El sitio web www.goodrx.com tiene cupones para medicamentos de Health and safety inspector. Los precios aqu no tienen en cuenta lo que podra costar con la ayuda del seguro (  puede ser ms barato con su seguro), pero el sitio web puede darle el precio si no Visual merchandiser.  - Puede imprimir el cupn correspondiente y llevarlo con su receta a la farmacia.  - Tambin puede pasar por nuestra oficina durante el horario de atencin regular y Education officer, museum una tarjeta de cupones de GoodRx.  - Si necesita que su receta se enve electrnicamente a una farmacia diferente, informe a nuestra oficina a travs de MyChart de [place] o por telfono llamando al [phone] y presione la opcin 4.

## 2022-11-26 NOTE — Telephone Encounter (Signed)
Left message for patient regarding suregery/hd

## 2022-12-03 ENCOUNTER — Ambulatory Visit: Payer: Medicare Other | Admitting: Dermatology

## 2022-12-03 ENCOUNTER — Encounter: Payer: Self-pay | Admitting: Dermatology

## 2022-12-03 DIAGNOSIS — L729 Follicular cyst of the skin and subcutaneous tissue, unspecified: Secondary | ICD-10-CM

## 2022-12-03 DIAGNOSIS — Z9889 Other specified postprocedural states: Secondary | ICD-10-CM

## 2022-12-03 NOTE — Patient Instructions (Addendum)
Continue Imiquimod 3 nights per week  Due to recent changes in healthcare laws, you may see results of your pathology and/or laboratory studies on MyChart before the doctors have had a chance to review them. We understand that in some cases there may be results that are confusing or concerning to you. Please understand that not all results are received at the same time and often the doctors may need to interpret multiple results in order to provide you with the best plan of care or course of treatment. Therefore, we ask that you please give [place] 2 business days to thoroughly review all your results before contacting the office for clarification. Should we see a critical lab result, you will be contacted sooner.   If You Need Anything After Your Visit  If you have any questions or concerns for your doctor, please call our main line at [phone] and press option 4 to reach your doctor's medical assistant. If no one answers, please leave a voicemail as directed and we will return your call as soon as possible. Messages left after 4 pm will be answered the following business day.   You may also send [place] a message via MyChart. We typically respond to MyChart messages within 1-2 business days.  For prescription refills, please ask your pharmacy to contact our office. Our fax number is [phone].  If you have an urgent issue when the clinic is closed that cannot wait until the next business day, you can page your doctor at the number below.    Please note that while we do our best to be available for urgent issues outside of office hours, we are not available 24/7.   If you have an urgent issue and are unable to reach [place], you may choose to seek medical care at your doctor's office, retail clinic, urgent care center, or emergency room.  If you have a medical emergency, please immediately call 911 or go to the emergency department.  Pager Numbers  - Dr. Gwen Pounds: [phone]  - Dr. Roseanne Reno:  [phone]  - Dr. Katrinka Blazing: [phone]   In the event of inclement weather, please call our main line at [phone] for an update on the status of any delays or closures.  Dermatology Medication Tips: Please keep the boxes that topical medications come in in order to help keep track of the instructions about where and how to use these. Pharmacies typically print the medication instructions only on the boxes and not directly on the medication tubes.   If your medication is too expensive, please contact our office at [phone] option 4 or send [place] a message through MyChart.   We are unable to tell what your co-pay for medications will be in advance as this is different depending on your insurance coverage. However, we may be able to find a substitute medication at lower cost or fill out paperwork to get insurance to cover a needed medication.   If a prior authorization is required to get your medication covered by your insurance company, please allow [place] 1-2 business days to complete this process.  Drug prices often vary depending on where the prescription is filled and some pharmacies may offer cheaper prices.  The website www.goodrx.com contains coupons for medications through different pharmacies. The prices here do not account for what the cost may be with help from insurance (it may be cheaper with your insurance), but the website can give you the price if you did not use any insurance.  - You can  print the associated coupon and take it with your prescription to the pharmacy.  - You may also stop by our office during regular business hours and pick up a GoodRx coupon card.  - If you need your prescription sent electronically to a different pharmacy, notify our office through [facility] or by phone at [phone] option 4.     Si Usted Necesita Algo Despus de Su Visita  Tambin puede enviarnos un mensaje a travs de Clinical cytogeneticist. Por lo general respondemos a los mensajes de  MyChart en el transcurso de 1 a 2 das hbiles.  Para renovar recetas, por favor pida a su farmacia que se ponga en contacto con nuestra oficina. Annie Sable de fax es [place] [phone].  Si tiene un asunto urgente cuando la clnica est cerrada y que no puede esperar hasta el siguiente da hbil, puede llamar/localizar a su doctor(a) al nmero que aparece a continuacin.   Por favor, tenga en cuenta que aunque hacemos todo lo posible para estar disponibles para asuntos urgentes fuera del horario de [place], no estamos disponibles las 24 horas del da, los 7 [address]  [address] de la [place].   Si tiene un problema urgente y no puede comunicarse con nosotros, puede optar por buscar atencin mdica  en el consultorio de su doctor(a), en una clnica privada, en un centro de atencin urgente o en una sala de emergencias.  Si tiene Engineer, drilling, por favor llame inmediatamente al 911 o vaya a la sala de emergencias.  Nmeros de bper  - Dr. Gwen Pounds: [phone]  - Dra. Roseanne Reno: [phone]  - Dr. Katrinka Blazing: [phone]   En caso de inclemencias del tiempo, por favor llame a Lacy Duverney principal al [phone] para una actualizacin sobre el [place] de cualquier retraso o cierre.  Consejos para la medicacin en dermatologa: Por favor, guarde las cajas en las que vienen los medicamentos de uso tpico para ayudarle a seguir las instrucciones sobre dnde y cmo usarlos. Las farmacias generalmente imprimen las instrucciones del medicamento slo en las cajas y no directamente en los tubos del [place].   Si su medicamento es muy caro, por favor, pngase en contacto con Rolm Gala llamando al [phone] y presione la opcin 4 o envenos un mensaje a travs de Clinical cytogeneticist.   No podemos decirle cul ser su copago por los medicamentos por adelantado ya que esto es diferente dependiendo de la cobertura de su seguro. Sin embargo, es posible que podamos encontrar un medicamento sustituto a Advice worker un formulario para que el seguro cubra el medicamento que se considera necesario.   Si se requiere una autorizacin previa para que su compaa de seguros [place] su medicamento, por favor permtanos de 1 a 2 das hbiles para completar [address]  Los precios de los medicamentos varan con frecuencia dependiendo del Environmental consultant de dnde se surte la receta y alguna farmacias pueden ofrecer precios ms baratos.  El sitio web www.goodrx.com tiene cupones para medicamentos de Health and safety inspector. Los precios aqu no tienen en cuenta lo que podra costar con la ayuda del seguro (puede ser ms barato con su seguro), pero el sitio web puede darle el precio si no utiliz Tourist information centre manager.  - Puede imprimir el cupn correspondiente y llevarlo con su receta a la farmacia.  - Tambin puede pasar por nuestra oficina durante el horario de atencin regular y Education officer, museum una tarjeta de cupones de GoodRx.  - Si necesita que su receta se enve electrnicamente a una farmacia diferente, informe a  nuestra oficina a travs de MyChart de [place] o por telfono llamando al [phone] y presione la opcin 4.

## 2022-12-03 NOTE — Progress Notes (Signed)
   Follow-Up Visit   Subjective  Jeffery Mccormick is a 58 y.o. male who presents for the following: Suture removal  Check warts on hands. Tx ~3 weeks ago. Has imiquimod at home.   Pathology showed Epidermal inclusion cyst  The following portions of the chart were reviewed this encounter and updated as appropriate: medications, allergies, medical history  Review of Systems:  No other skin or systemic complaints except as noted in HPI or Assessment and Plan.  Objective  Well appearing patient in no apparent distress; mood and affect are within normal limits.  Areas Examined: back Relevant physical exam findings are noted in the Assessment and Plan.    Assessment & Plan    Encounter for Removal of Sutures - Incision site is clean, dry and intact. - Wound cleansed, sutures removed, wound cleansed and steri strips applied.  - Discussed pathology results showing EIC - Patient advised to keep steri-strips dry until they fall off. - Scars remodel for a full year. - Once steri-strips fall off, patient can apply over-the-counter silicone scar cream once to twice a day to help with scar remodeling if desired. - Patient advised to call with any concerns or if they notice any new or changing lesions.  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  Multiple treatments and methods may be required to clear warts and it is possible treatment may not be successful.  Treatment risks include discoloration; scarring and there is still potential for wart recurrence.  Treatment Plan: Continue Imiquimod 3 nights per week   Return in about 6 months (around 06/05/2023) for Wart Follow UP.  I, Lawson Radar, CMA, am acting as scribe for Armida Sans, MD.   Documentation: I have reviewed the above documentation for accuracy and completeness, and I agree  with the above.  Armida Sans, MD

## 2023-01-31 ENCOUNTER — Inpatient Hospital Stay: Payer: Medicare Other | Attending: Oncology

## 2023-01-31 ENCOUNTER — Inpatient Hospital Stay (HOSPITAL_BASED_OUTPATIENT_CLINIC_OR_DEPARTMENT_OTHER): Payer: Medicare Other | Admitting: Oncology

## 2023-01-31 ENCOUNTER — Encounter: Payer: Self-pay | Admitting: Oncology

## 2023-01-31 VITALS — BP 177/78 | HR 74 | Temp 97.7°F | Resp 18 | Wt 214.2 lb

## 2023-01-31 DIAGNOSIS — R911 Solitary pulmonary nodule: Secondary | ICD-10-CM | POA: Diagnosis not present

## 2023-01-31 DIAGNOSIS — G620 Drug-induced polyneuropathy: Secondary | ICD-10-CM | POA: Diagnosis not present

## 2023-01-31 DIAGNOSIS — F17210 Nicotine dependence, cigarettes, uncomplicated: Secondary | ICD-10-CM | POA: Insufficient documentation

## 2023-01-31 DIAGNOSIS — D751 Secondary polycythemia: Secondary | ICD-10-CM

## 2023-01-31 DIAGNOSIS — T451X5A Adverse effect of antineoplastic and immunosuppressive drugs, initial encounter: Secondary | ICD-10-CM

## 2023-01-31 DIAGNOSIS — C20 Malignant neoplasm of rectum: Secondary | ICD-10-CM

## 2023-01-31 DIAGNOSIS — R918 Other nonspecific abnormal finding of lung field: Secondary | ICD-10-CM | POA: Insufficient documentation

## 2023-01-31 DIAGNOSIS — R748 Abnormal levels of other serum enzymes: Secondary | ICD-10-CM | POA: Diagnosis not present

## 2023-01-31 LAB — CMP (CANCER CENTER ONLY)
ALT: 38 U/L (ref 0–44)
AST: 31 U/L (ref 15–41)
Albumin: 4.1 g/dL (ref 3.5–5.0)
Alkaline Phosphatase: 100 U/L (ref 38–126)
Anion gap: 9 (ref 5–15)
BUN: 8 mg/dL (ref 6–20)
CO2: 22 mmol/L (ref 22–32)
Calcium: 9.2 mg/dL (ref 8.9–10.3)
Chloride: 103 mmol/L (ref 98–111)
Creatinine: 1 mg/dL (ref 0.61–1.24)
GFR, Estimated: >60 mL/min (ref >60)
Glucose, Bld: 105 mg/dL — ABNORMAL HIGH (ref 70–99)
Potassium: 3.8 mmol/L (ref 3.5–5.1)
Sodium: 134 mmol/L — ABNORMAL LOW (ref 135–145)
Total Bilirubin: 1 mg/dL (ref 0.3–1.2)
Total Protein: 7.7 g/dL (ref 6.5–8.1)

## 2023-01-31 LAB — CBC WITH DIFFERENTIAL (CANCER CENTER ONLY)
Abs Immature Granulocytes: 0.02 10*3/uL (ref 0.00–0.07)
Basophils Absolute: 0.1 10*3/uL (ref 0.0–0.1)
Basophils Relative: 1 %
Eosinophils Absolute: 0.1 10*3/uL (ref 0.0–0.5)
Eosinophils Relative: 2 %
HCT: 48 % (ref 39.0–52.0)
Hemoglobin: 17 g/dL (ref 13.0–17.0)
Immature Granulocytes: 0 %
Lymphocytes Relative: 21 %
Lymphs Abs: 1.4 10*3/uL (ref 0.7–4.0)
MCH: 36.6 pg — ABNORMAL HIGH (ref 26.0–34.0)
MCHC: 35.4 g/dL (ref 30.0–36.0)
MCV: 103.2 fL — ABNORMAL HIGH (ref 80.0–100.0)
Monocytes Absolute: 0.6 10*3/uL (ref 0.1–1.0)
Monocytes Relative: 10 %
Neutro Abs: 4.3 10*3/uL (ref 1.7–7.7)
Neutrophils Relative %: 66 %
Platelet Count: 187 10*3/uL (ref 150–400)
RBC: 4.65 MIL/uL (ref 4.22–5.81)
RDW: 13.6 % (ref 11.5–15.5)
WBC Count: 6.6 10*3/uL (ref 4.0–10.5)
nRBC: 0 % (ref 0.0–0.2)

## 2023-01-31 NOTE — Assessment & Plan Note (Signed)
Erythrocytosis has resolved. No need for phlebotomy.

## 2023-01-31 NOTE — Assessment & Plan Note (Signed)
Grade 1, improved symptoms.  Observation.

## 2023-01-31 NOTE — Progress Notes (Signed)
Hematology/Oncology Progress note [phone] [phone]     CHIEF COMPLAINTS/REASON FOR VISIT:  Follow-up for treatment of rectal cancer,  ASSESSMENT & PLAN:   Rectal cancer (HCC) #Low rectal cancer, s/p total neoadjuvant therapy.chemotherapy regimen with FOLFOX Q2w x 4 months, followed by concurrent Xeloda with RT, Status post local excision- ypT1 disease,  February 2023 MRI pelvis, CT chest and abdomen showed no recurrence. Labs reviewed and discussed with patient.  CEA is pending at time of dictation. June 2024 colonoscopy negative for recurrence. He is overdue for  image surveillance at [facility], I urge patient to keep his rescheduled appt.  Also discussed with patient about alternative of surveillance imaging locally at [facility].  Patient prefers to call [facility] and get imaging and follow-up arranged. He knows to call our clinic if he changes mind.  He is about 4.5 years after his rectal CA treatments.    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  Observation.  Erythrocytosis Erythrocytosis has resolved. No need for phlebotomy.   Lung nodule -per CT done at [facility], report states fissural and subpleural nodules without changes.  No new or enlarging nodules. Attention on follow-up Cts at [facility]  Orders Placed This Encounter  Procedures   CBC with Differential (Cancer Center Only)    Standing Status:   Future    Standing Expiration Date:   01/31/2024   CEA    Standing Status:   Future    Standing Expiration Date:   01/31/2024   CMP (Cancer Center only)    Standing Status:   Future    Standing Expiration Date:   01/31/2024   Follow up in 6 months.  All questions were answered. The patient knows to call the clinic with any problems, questions or concerns.  Rickard Patience, MD, PhD [facility] Hematology Oncology 01/31/2023    HISTORY OF PRESENTING ILLNESS:  Jeffery Mccormick is a  57 y.o.  male presents for follow-up of rectal cancer.   Recently found to have elevated alkaline phosphatase. Rectal cancer cT3 N0 M0. 12/25/2017 colonoscopy : revealed anal mass, 5-6 cm from the anal verge.  # CT chest abdomen pelvis 01/16/2018 showed Wall thickening involving the lower rectum, corresponding to the patient's known rectal cancer. No findings specific for metastatic disease. Scattered small bilateral pulmonary nodules measuring up to 3 mm, nonspecific but not considered overly suspicious for metastatic disease.  #01/23/2018 MRI pelvis with and without contrast T3N0  #Cancer treatment TNT neoadjuvant protocol #02/09/2018 to 05/18/2018 FOLFOX q. 14 days x 8 #07/10/2018 finished concurrent chemo with Xeloda/radiation  # Low rectal cancer, s/p total neoadjuvant therapy.chemotherapy regimen with FOLFOX Q2w x 4 months, followed by concurrent Xeloda with RT,  Status post local excision- ypT1 disease,  09/25/2018 rectum local excision at [facility] Pathology showed minute focus of residual viable adenocarcinoma involving submucosa and superficial muscularis propria. Margins are negative. Patient opted to have local excision without proceeding with APR.  ypT1  #[facility] 04/05/2019 MRI pelvis with and without contrast . No evidence of tumor on current exam.  No evidence of metastatic disease in the pelvis. 04/22/2019, patient had colonoscopy done at [facility].  Patient had 3 small polyps in the sigmoid colon resected.  Scar in the distal rectum.  Biopsied.  Colon polyps are tubular adenoma and hyperplastic polyps.  Rectum biopsy showed hyperplastic polyp.  # 10/04/2019, CT chest abdomen pelvis showed bilateral scattered subcentimeter nodules unchanged from 01/16/2018.  Indeterminate but favored benign etiology given the stability.  Attention on  follow-up.  No new nodules. CT abdomen pelvis showed no evidence of metastatic disease in the abdomen.  # MRI 04/26/2020 Since 07/23/2018, the primary tumor and extramural disease show postsurgical changes with  predominantly fibrotic changes in the anterior rectum. No definite residual enhancing mass with restricted diffusion or enhancement identified.Posteriorly, there is suggestion of mild wall thickening with T2 intermediate to hyperintense signal without diffusion restriction. Findings are favored to represent hemorrhoids seen on the prior colonoscopy as well as the prior MRI. Correlate with recent colonoscopic findings.  Post treatment category: TRG stage 1  # MRI pelvis done at [facility] in January 2022.  No recurrence.  #Port-A-Cath in place, Mediport was removed by Dr. Tonna Boehringer on 01/24/2021  05/21/2021, MRI pelvis with and without contrast showed no identifiable residual tumor.  Similar postsurgical and fibrotic changes along the anterior rectum without residual enhancing mass or diffusion restriction.  Similar intermediate T2 signal along the posterior rectal wall.  No suspicious enhancement or diffusion restriction.  Favored to represent hemorrhoids as seen on prior colonoscopy.  Posttreatment category TRG stage I. 05/21/2021, CT chest with contrast and CT abdomen with contrast showed no metastasis.  INTERVAL HISTORY Jeffery Mccormick is a 57 y.o. male who has above history reviewed by me today presents for rectal cancer cT3 N0 M0, status post total neoadjuvant treatments, status post local excision, ypT1 Patient is overdue for his follow up with [facility] June 2024 colonoscopy at [facility] showed no recurrence, diverticulosis in sigmoid colon and ascending colon. Patient denies any blood in the stool or dark stool, nausea vomiting diarrhea, abdominal pain.   Review of Systems  Constitutional:  Negative for chills, fever, malaise/fatigue and weight loss.  HENT:  Negative for nosebleeds and sore throat.   Eyes:  Negative for double vision, photophobia and redness.  Respiratory:  Negative for cough, shortness of breath and wheezing.   Cardiovascular:  Negative for chest pain, palpitations, orthopnea and leg swelling.   Gastrointestinal:  Negative for abdominal pain, blood in stool, diarrhea, nausea and vomiting.  Genitourinary:  Negative for dysuria.  Musculoskeletal:  Negative for back pain, myalgias and neck pain.  Skin:  Negative for itching and rash.  Neurological:  Negative for dizziness, tingling and tremors.  Endo/Heme/Allergies:  Negative for environmental allergies. Does not bruise/bleed easily.  Psychiatric/Behavioral:  Negative for depression and hallucinations.     MEDICAL HISTORY:  Past Medical History:  Diagnosis Date   Hemorrhoid prolapse     SURGICAL HISTORY: Past Surgical History:  Procedure Laterality Date   COLONOSCOPY WITH PROPOFOL N/A 12/25/2017   Procedure: COLONOSCOPY WITH PROPOFOL;  Surgeon: Sung Amabile, DO;  Location: [facility] ENDOSCOPY;  Service: General;  Laterality: N/A;   HERNIA REPAIR  07/2013   Right Inguinal-Dr. Excell Seltzer   PORTACATH PLACEMENT Right 02/03/2018   Procedure: INSERTION PORT-A-CATH;  Surgeon: Sung Amabile, DO;  Location: [facility] ORS;  Service: General;  Laterality: Right;    SOCIAL HISTORY: Social History   Socioeconomic History   Marital status: Married    Spouse name: Not on file   Number of children: Not on file   Years of education: Not on file   Highest education level: Not on file  Occupational History   Occupation: unemployed  Tobacco Use   Smoking status: Every Day    Current packs/day: 1.00    Average packs/day: 1 pack/day for 30.0 years (30.0 ttl pk-yrs)    Types: Cigarettes   Smokeless tobacco: Never  Vaping Use   Vaping status: Never Used  Substance  and Sexual Activity   Alcohol use: Yes    Comment: occasionally   Drug use: No   Sexual activity: Not on file  Other Topics Concern   Not on file  Social History Narrative   Not on file   Social Determinants of Health   Financial Resource Strain: Not on file  Food Insecurity: Not on file  Transportation Needs: Not on file  Physical Activity: Not on file  Stress: Not on file   Social Connections: Not on file  Intimate Partner Violence: Not on file    FAMILY HISTORY: Family History  Family history unknown: Yes  patient reports that he does not know any family history.   ALLERGIES:  has No Known Allergies.  MEDICATIONS:  Current Outpatient Medications  Medication Sig Dispense Refill   imiquimod (ALDARA) 5 % cream Apply topically at bedtime. 3 times per week 12 each 2   lisinopril (ZESTRIL) 20 MG tablet Take 20 mg by mouth daily.     mupirocin ointment (BACTROBAN) 2 % Apply 1 Application topically daily. With dressing changes 22 g 0   No current facility-administered medications for this visit.   Facility-Administered Medications Ordered in Other Visits  Medication Dose Route Frequency Provider Last Rate Last Admin   heparin lock flush 100 unit/mL  500 Units Intravenous Once Rickard Patience, MD       sodium chloride flush (NS) 0.9 % injection 10 mL  10 mL Intravenous Once Rickard Patience, MD       RADIOGRAPHIC STUDIES: I have personally reviewed the radiological images as listed and agreed with the findings in the report. No results found.   PHYSICAL EXAMINATION: ECOG PERFORMANCE STATUS: 0 - Asymptomatic Vitals:   01/31/23 1009  BP: (!) 177/78  Pulse: 74  Resp: 18  Temp: 97.7 F (36.5 C)   Filed Weights   01/31/23 1009  Weight: 214 lb 3.2 oz (97.2 kg)    Physical Exam Constitutional:      General: He is not in acute distress. HENT:     Head: Normocephalic and atraumatic.  Eyes:     General: No scleral icterus.    Pupils: Pupils are equal, round, and reactive to light.  Cardiovascular:     Rate and Rhythm: Normal rate and regular rhythm.     Heart sounds: Normal heart sounds.  Pulmonary:     Effort: Pulmonary effort is normal. No respiratory distress.     Breath sounds: No wheezing.  Abdominal:     General: Bowel sounds are normal. There is no distension.     Palpations: Abdomen is soft. There is no mass.     Tenderness: There is no abdominal  tenderness.  Musculoskeletal:        General: No deformity. Normal range of motion.     Cervical back: Normal range of motion and neck supple.  Skin:    General: Skin is warm and dry.     Findings: No erythema or rash.  Neurological:     Mental Status: He is alert and oriented to person, place, and time. Mental status is at baseline.     Cranial Nerves: No cranial nerve deficit.     Coordination: Coordination normal.  Psychiatric:        Mood and Affect: Mood normal.        Thought Content: Thought content normal.      LABORATORY DATA:  I have reviewed the data as listed    Latest Ref Rng & Units 01/31/2023  9:59 AM 08/01/2022    9:55 AM 01/30/2022   10:40 AM  CBC  WBC 4.0 - 10.5 K/uL 6.6  10.7  7.0   Hemoglobin 13.0 - 17.0 g/dL 84.6  96.2  95.2   Hematocrit 39.0 - 52.0 % 48.0  50.9  47.3   Platelets 150 - 400 K/uL 187  206  226       Latest Ref Rng & Units 01/31/2023    9:59 AM 08/01/2022    9:56 AM 01/30/2022   10:40 AM  CMP  Glucose 70 - 99 mg/dL 841  324  401   BUN 6 - 20 mg/dL 8  8  9    Creatinine 0.61 - 1.24 mg/dL 0.27  2.53  6.64   Sodium 135 - 145 mmol/L 134  133  135   Potassium 3.5 - 5.1 mmol/L 3.8  3.8  4.1   Chloride 98 - 111 mmol/L 103  103  104   CO2 22 - 32 mmol/L 22  23  24    Calcium 8.9 - 10.3 mg/dL 9.2  9.2  9.3   Total Protein 6.5 - 8.1 g/dL 7.7  7.7  8.4   Total Bilirubin 0.3 - 1.2 mg/dL 1.0  0.6  0.7   Alkaline Phos 38 - 126 U/L 100  112  101   AST 15 - 41 U/L 31  23  28    ALT 0 - 44 U/L 38  27  35    01/29/2018 baseline CEA 11.6  RADIOGRAPHIC STUDIES: I have personally reviewed the radiological images as listed and agreed with the findings in the report. [facility] surveillance imaging results were reviewed.

## 2023-01-31 NOTE — Assessment & Plan Note (Addendum)
#  Low rectal cancer, s/p total neoadjuvant therapy.chemotherapy regimen with FOLFOX Q2w x 4 months, followed by concurrent Xeloda with RT, Status post local excision- ypT1 disease,  February 2023 MRI pelvis, CT chest and abdomen showed no recurrence. Labs reviewed and discussed with patient.  CEA is pending at time of dictation. June 2024 colonoscopy negative for recurrence. He is overdue for  image surveillance at [facility], I urge patient to keep his rescheduled appt.  Also discussed with patient about alternative of surveillance imaging locally at [facility].  Patient prefers to call [facility] and get imaging and follow-up arranged. He knows to call our clinic if he changes mind.  He is about 4.5 years after his rectal CA treatments.

## 2023-01-31 NOTE — Assessment & Plan Note (Signed)
-  per CT done at [facility], report states fissural and subpleural nodules without changes.  No new or enlarging nodules. Attention on follow-up Cts at [facility]

## 2023-02-02 LAB — CEA: CEA: 3.1 ng/mL (ref 0.0–4.7)

## 2023-06-05 ENCOUNTER — Encounter: Payer: Self-pay | Admitting: Dermatology

## 2023-06-05 ENCOUNTER — Ambulatory Visit (INDEPENDENT_AMBULATORY_CARE_PROVIDER_SITE_OTHER): Payer: Medicare Other | Admitting: Dermatology

## 2023-06-05 DIAGNOSIS — B079 Viral wart, unspecified: Secondary | ICD-10-CM

## 2023-06-05 DIAGNOSIS — Z79899 Other long term (current) drug therapy: Secondary | ICD-10-CM | POA: Diagnosis not present

## 2023-06-05 DIAGNOSIS — Z7189 Other specified counseling: Secondary | ICD-10-CM

## 2023-06-05 MED ORDER — IMIQUIMOD 5 % EX CREA: TOPICAL_CREAM | Freq: Every day | CUTANEOUS | 6 refills | Status: AC

## 2023-06-05 NOTE — Patient Instructions (Addendum)

## 2023-06-05 NOTE — Progress Notes (Signed)
   Follow-Up Visit   Subjective  Jeffery Mccormick is a 58 y.o. male who presents for the following:  patient here for wart follow up at hands, use imiquimod cream 3 nights weekly but reports still some areas today   The patient has spots, moles and lesions to be evaluated, some may be new or changing and the patient may have concern these could be cancer.   The following portions of the chart were reviewed this encounter and updated as appropriate: medications, allergies, medical history  Review of Systems:  No other skin or systemic complaints except as noted in HPI or Assessment and Plan.  Objective  Well appearing patient in no apparent distress; mood and affect are within normal limits.   A focused examination was performed of the following areas: B/l hands  Relevant exam findings are noted in the Assessment and Plan.  left proximal palm x 2, right proximal palm x 5 (7) Verrucous papules -- Discussed viral etiology and contagion.  All measure approximately 0.5 cm at right and left proximal palm  Assessment & Plan  VIRAL WARTS, UNSPECIFIED TYPE (7) left proximal palm x 2, right proximal palm x 5 (7) Viral Wart (HPV) Counseling  Discussed viral / HPV (Human Papilloma Virus) etiology and risk of spread /infectivity to other areas of body as well as to other people.  Multiple treatments and methods may be required to clear warts and it is possible treatment may not be successful.  Treatment risks include discoloration; scarring and there is still potential for wart recurrence. Destruction of lesion - left proximal palm x 2, right proximal palm x 5 (7) Complexity: simple   Destruction method: cryotherapy   Informed consent: discussed and consent obtained   Timeout:  patient name, date of birth, surgical site, and procedure verified Lesion destroyed using liquid nitrogen: Yes   Region frozen until ice ball extended beyond lesion: Yes   Outcome: patient tolerated procedure well with  no complications   Post-procedure details: wound care instructions given   Related Medications imiquimod (ALDARA) 5 % cream Apply topically at bedtime. 3 times per week  Return for 6 - 8 month wart follow up.  IAsher Muir, CMA, am acting as scribe for Armida Sans, MD.   Documentation: I have reviewed the above documentation for accuracy and completeness, and I agree with the above.  Armida Sans, MD

## 2023-08-01 ENCOUNTER — Encounter: Payer: Self-pay | Admitting: Oncology

## 2023-08-01 ENCOUNTER — Inpatient Hospital Stay: Payer: Medicare Other | Attending: Oncology

## 2023-08-01 ENCOUNTER — Inpatient Hospital Stay: Payer: Medicare Other | Admitting: Oncology

## 2023-08-01 VITALS — BP 155/88 | HR 84 | Temp 97.0°F | Resp 16 | Wt 212.0 lb

## 2023-08-01 DIAGNOSIS — C20 Malignant neoplasm of rectum: Secondary | ICD-10-CM | POA: Diagnosis not present

## 2023-08-01 DIAGNOSIS — F17210 Nicotine dependence, cigarettes, uncomplicated: Secondary | ICD-10-CM | POA: Diagnosis not present

## 2023-08-01 DIAGNOSIS — Z85048 Personal history of other malignant neoplasm of rectum, rectosigmoid junction, and anus: Secondary | ICD-10-CM | POA: Insufficient documentation

## 2023-08-01 DIAGNOSIS — M25559 Pain in unspecified hip: Secondary | ICD-10-CM | POA: Diagnosis not present

## 2023-08-01 DIAGNOSIS — R918 Other nonspecific abnormal finding of lung field: Secondary | ICD-10-CM | POA: Diagnosis not present

## 2023-08-01 DIAGNOSIS — R911 Solitary pulmonary nodule: Secondary | ICD-10-CM

## 2023-08-01 DIAGNOSIS — D751 Secondary polycythemia: Secondary | ICD-10-CM | POA: Diagnosis not present

## 2023-08-01 DIAGNOSIS — M25551 Pain in right hip: Secondary | ICD-10-CM | POA: Diagnosis not present

## 2023-08-01 DIAGNOSIS — R748 Abnormal levels of other serum enzymes: Secondary | ICD-10-CM | POA: Diagnosis not present

## 2023-08-01 DIAGNOSIS — M25552 Pain in left hip: Secondary | ICD-10-CM

## 2023-08-01 LAB — CMP (CANCER CENTER ONLY)
ALT: 31 U/L (ref 0–44)
AST: 24 U/L (ref 15–41)
Albumin: 4 g/dL (ref 3.5–5.0)
Alkaline Phosphatase: 95 U/L (ref 38–126)
Anion gap: 10 (ref 5–15)
BUN: 15 mg/dL (ref 6–20)
CO2: 21 mmol/L — ABNORMAL LOW (ref 22–32)
Calcium: 9.3 mg/dL (ref 8.9–10.3)
Chloride: 100 mmol/L (ref 98–111)
Creatinine: 1 mg/dL (ref 0.61–1.24)
GFR, Estimated: >60 mL/min (ref >60)
Glucose, Bld: 110 mg/dL — ABNORMAL HIGH (ref 70–99)
Potassium: 4.2 mmol/L (ref 3.5–5.1)
Sodium: 131 mmol/L — ABNORMAL LOW (ref 135–145)
Total Bilirubin: 0.4 mg/dL (ref 0.0–1.2)
Total Protein: 7.9 g/dL (ref 6.5–8.1)

## 2023-08-01 LAB — CBC WITH DIFFERENTIAL (CANCER CENTER ONLY)
Abs Immature Granulocytes: 0.04 10*3/uL (ref 0.00–0.07)
Basophils Absolute: 0.1 10*3/uL (ref 0.0–0.1)
Basophils Relative: 1 %
Eosinophils Absolute: 0.1 10*3/uL (ref 0.0–0.5)
Eosinophils Relative: 2 %
HCT: 48.4 % (ref 39.0–52.0)
Hemoglobin: 16.9 g/dL (ref 13.0–17.0)
Immature Granulocytes: 1 %
Lymphocytes Relative: 21 %
Lymphs Abs: 1.8 10*3/uL (ref 0.7–4.0)
MCH: 35.2 pg — ABNORMAL HIGH (ref 26.0–34.0)
MCHC: 34.9 g/dL (ref 30.0–36.0)
MCV: 100.8 fL — ABNORMAL HIGH (ref 80.0–100.0)
Monocytes Absolute: 0.8 10*3/uL (ref 0.1–1.0)
Monocytes Relative: 10 %
Neutro Abs: 5.7 10*3/uL (ref 1.7–7.7)
Neutrophils Relative %: 65 %
Platelet Count: 225 10*3/uL (ref 150–400)
RBC: 4.8 MIL/uL (ref 4.22–5.81)
RDW: 13.4 % (ref 11.5–15.5)
WBC Count: 8.5 10*3/uL (ref 4.0–10.5)
nRBC: 0 % (ref 0.0–0.2)

## 2023-08-01 NOTE — Assessment & Plan Note (Addendum)
-  per CT done at [facility], report states fissural and subpleural nodules without changes.  No new or enlarging nodules. Recommend CT chest wo contrast annually.  Patient will discuss with wife and update me if he would like to continue follow up annually with CT chest imaging vs to be referred to lung nodule/cancer screening program.

## 2023-08-01 NOTE — Assessment & Plan Note (Signed)
 Recommend patient to get orthopedic evaluation.

## 2023-08-01 NOTE — Assessment & Plan Note (Addendum)
#  Low rectal cancer, s/p total neoadjuvant therapy.chemotherapy regimen with FOLFOX Q2w x 4 months, followed by concurrent Xeloda  with RT, Status post local excision- ypT1 disease,  February 2023 MRI pelvis, CT chest and abdomen showed no recurrence. Labs reviewed and discussed with patient.  CEA is pending at time of dictation. June 2024 colonoscopy negative for recurrence- recommend repeat in 3 years [June 2027]  Dec 2024  image surveillance at [facility] - no recurrence.  He is now more than 5years after finishing rectal cancer treatment.  Follow up PRN.

## 2023-08-01 NOTE — Progress Notes (Signed)
 Hematology/Oncology Progress note [phone] [phone]     CHIEF COMPLAINTS/REASON FOR VISIT:  Follow-up for treatment of rectal cancer,  ASSESSMENT & PLAN:   Rectal cancer (HCC) #Low rectal cancer, s/p total neoadjuvant therapy.chemotherapy regimen with FOLFOX Q2w x 4 months, followed by concurrent Xeloda  with RT, Status post local excision- ypT1 disease,  February 2023 MRI pelvis, CT chest and abdomen showed no recurrence. Labs reviewed and discussed with patient.  CEA is pending at time of dictation. June 2024 colonoscopy negative for recurrence- recommend repeat in 3 years [June 2027]  Dec 2024  image surveillance at [facility] - no recurrence.  He is now more than 5years after finishing rectal cancer treatment.  Follow up PRN.     Erythrocytosis Erythrocytosis has resolved. No need for phlebotomy.   Lung nodule -per CT done at [facility], report states fissural and subpleural nodules without changes.  No new or enlarging nodules. Recommend CT chest wo contrast annually.  Patient will discuss with wife and update me if he would like to continue follow up annually with CT chest imaging vs to be referred to lung nodule/cancer screening program.    Hip pain Recommend patient to get orthopedic evaluation.    Follow up PRN All questions were answered. The patient knows to call the clinic with any problems, questions or concerns.  Timmy Forbes, MD, PhD [facility] Hematology Oncology 08/01/2023    HISTORY OF PRESENTING ILLNESS:  Jeffery Mccormick is a  58 y.o.  male presents for follow-up of rectal cancer.  Recently found to have elevated alkaline phosphatase. Rectal cancer cT3 N0 M0. 12/25/2017 colonoscopy : revealed anal mass, 5-6 cm from the anal verge.  # CT chest abdomen pelvis 01/16/2018 showed Wall thickening involving the lower rectum, corresponding to the patient's known rectal cancer. No findings specific for metastatic disease. Scattered small bilateral  pulmonary nodules measuring up to 3 mm, nonspecific but not considered overly suspicious for metastatic disease.  #01/23/2018 MRI pelvis with and without contrast T3N0  #Cancer treatment TNT neoadjuvant protocol #02/09/2018 to 05/18/2018 FOLFOX q. 14 days x 8 #07/10/2018 finished concurrent chemo with Xeloda /radiation  # Low rectal cancer, s/p total neoadjuvant therapy.chemotherapy regimen with FOLFOX Q2w x 4 months, followed by concurrent Xeloda  with RT,  Status post local excision- ypT1 disease,  09/25/2018 rectum local excision at [facility] Pathology showed minute focus of residual viable adenocarcinoma involving submucosa and superficial muscularis propria. Margins are negative. Patient opted to have local excision without proceeding with APR.  ypT1  #[facility] 04/05/2019 MRI pelvis with and without contrast . No evidence of tumor on current exam.  No evidence of metastatic disease in the pelvis. 04/22/2019, patient had colonoscopy done at [facility].  Patient had 3 small polyps in the sigmoid colon resected.  Scar in the distal rectum.  Biopsied.  Colon polyps are tubular adenoma and hyperplastic polyps.  Rectum biopsy showed hyperplastic polyp.  # 10/04/2019, CT chest abdomen pelvis showed bilateral scattered subcentimeter nodules unchanged from 01/16/2018.  Indeterminate but favored benign etiology given the stability.  Attention on follow-up.  No new nodules. CT abdomen pelvis showed no evidence of metastatic disease in the abdomen.  # MRI 04/26/2020 Since 07/23/2018, the primary tumor and extramural disease show postsurgical changes with predominantly fibrotic changes in the anterior rectum. No definite residual enhancing mass with restricted diffusion or enhancement identified.Posteriorly, there is suggestion of mild wall thickening with T2 intermediate to hyperintense signal without diffusion restriction. Findings are favored to represent hemorrhoids seen on the  prior colonoscopy as well as the prior MRI.  Correlate with recent colonoscopic findings.  Post treatment category: TRG stage 1  # MRI pelvis done at [facility] in January 2022.  No recurrence.  #Port-A-Cath in place, Mediport was removed by Dr. Rosea Conch on 01/24/2021  05/21/2021, MRI pelvis with and without contrast showed no identifiable residual tumor.  Similar postsurgical and fibrotic changes along the anterior rectum without residual enhancing mass or diffusion restriction.  Similar intermediate T2 signal along the posterior rectal wall.  No suspicious enhancement or diffusion restriction.  Favored to represent hemorrhoids as seen on prior colonoscopy.  Posttreatment category TRG stage I. 05/21/2021, CT chest with contrast and CT abdomen with contrast showed no metastasis.  INTERVAL HISTORY KAMARII CARTON is a 58 y.o. male who has above history reviewed by me today presents for rectal cancer cT3 N0 M0, status post total neoadjuvant treatments, status post local excision, ypT1  June 2024 colonoscopy at [facility] showed no recurrence, diverticulosis in sigmoid colon and ascending colon. Dec 2024 CT chest, abdomen and MRI pelvis showed no recurrence. Stable lung nodule.  He reports feeling well. + hip pain, intermittently.    Review of Systems  Constitutional:  Negative for chills, fever, malaise/fatigue and weight loss.  HENT:  Negative for nosebleeds and sore throat.   Eyes:  Negative for double vision, photophobia and redness.  Respiratory:  Negative for cough, shortness of breath and wheezing.   Cardiovascular:  Negative for chest pain, palpitations, orthopnea and leg swelling.  Gastrointestinal:  Negative for abdominal pain, blood in stool, diarrhea, nausea and vomiting.  Genitourinary:  Negative for dysuria.  Musculoskeletal:  Positive for joint pain. Negative for back pain and neck pain.  Skin:  Negative for itching and rash.  Neurological:  Negative for dizziness, tingling and tremors.  Endo/Heme/Allergies:  Negative for environmental  allergies. Does not bruise/bleed easily.  Psychiatric/Behavioral:  Negative for depression and hallucinations.     MEDICAL HISTORY:  Past Medical History:  Diagnosis Date   Hemorrhoid prolapse     SURGICAL HISTORY: Past Surgical History:  Procedure Laterality Date   COLONOSCOPY WITH PROPOFOL  N/A 12/25/2017   Procedure: COLONOSCOPY WITH PROPOFOL ;  Surgeon: Conrado Delay, DO;  Location: [facility] ENDOSCOPY;  Service: General;  Laterality: N/A;   HERNIA REPAIR  07/2013   Right Inguinal-Dr. Arlester Ladd   PORTACATH PLACEMENT Right 02/03/2018   Procedure: INSERTION PORT-A-CATH;  Surgeon: Conrado Delay, DO;  Location: [facility] ORS;  Service: General;  Laterality: Right;    SOCIAL HISTORY: Social History   Socioeconomic History   Marital status: Married    Spouse name: Not on file   Number of children: Not on file   Years of education: Not on file   Highest education level: Not on file  Occupational History   Occupation: unemployed  Tobacco Use   Smoking status: Every Day    Current packs/day: 1.00    Average packs/day: 1 pack/day for 30.0 years (30.0 ttl pk-yrs)    Types: Cigars, Cigarettes   Smokeless tobacco: Never  Vaping Use   Vaping status: Never Used  Substance and Sexual Activity   Alcohol use: Yes    Comment: occasionally   Drug use: No   Sexual activity: Not on file  Other Topics Concern   Not on file  Social History Narrative   Not on file   Social Drivers of Health   Financial Resource Strain: Not on file  Food Insecurity: Not on file  Transportation Needs: Not on file  Physical  Activity: Not on file  Stress: Not on file  Social Connections: Not on file  Intimate Partner Violence: Not on file    FAMILY HISTORY: Family History  Family history unknown: Yes  patient reports that he does not know any family history.   ALLERGIES:  has no known allergies.  MEDICATIONS:  Current Outpatient Medications  Medication Sig Dispense Refill   imiquimod  (ALDARA ) 5 % cream  Apply topically at bedtime. 3 times per week 12 each 6   lisinopril (ZESTRIL) 20 MG tablet Take 20 mg by mouth daily.     No current facility-administered medications for this visit.   Facility-Administered Medications Ordered in Other Visits  Medication Dose Route Frequency Provider Last Rate Last Admin   heparin  lock flush 100 unit/mL  500 Units Intravenous Once Timmy Forbes, MD       sodium chloride  flush (NS) 0.9 % injection 10 mL  10 mL Intravenous Once Timmy Forbes, MD       RADIOGRAPHIC STUDIES: I have personally reviewed the radiological images as listed and agreed with the findings in the report. No results found.   PHYSICAL EXAMINATION: ECOG PERFORMANCE STATUS: 0 - Asymptomatic Vitals:   08/01/23 1035  BP: (!) 155/88  Pulse: 84  Resp: 16  Temp: (!) 97 F (36.1 C)  SpO2: 97%   Filed Weights   08/01/23 1035  Weight: 212 lb (96.2 kg)    Physical Exam Constitutional:      General: He is not in acute distress. HENT:     Head: Normocephalic and atraumatic.  Eyes:     General: No scleral icterus.    Pupils: Pupils are equal, round, and reactive to light.  Cardiovascular:     Rate and Rhythm: Normal rate and regular rhythm.     Heart sounds: Normal heart sounds.  Pulmonary:     Effort: Pulmonary effort is normal. No respiratory distress.     Breath sounds: No wheezing.  Abdominal:     General: Bowel sounds are normal. There is no distension.     Palpations: Abdomen is soft. There is no mass.     Tenderness: There is no abdominal tenderness.  Musculoskeletal:        General: No deformity. Normal range of motion.     Cervical back: Normal range of motion and neck supple.  Skin:    General: Skin is warm and dry.     Findings: No erythema or rash.  Neurological:     Mental Status: He is alert and oriented to person, place, and time. Mental status is at baseline.     Cranial Nerves: No cranial nerve deficit.     Coordination: Coordination normal.  Psychiatric:         Mood and Affect: Mood normal.        Thought Content: Thought content normal.      LABORATORY DATA:  I have reviewed the data as listed    Latest Ref Rng & Units 08/01/2023   10:15 AM 01/31/2023    9:59 AM 08/01/2022    9:55 AM  CBC  WBC 4.0 - 10.5 K/uL 8.5  6.6  10.7   Hemoglobin 13.0 - 17.0 g/dL 86.5  78.4  69.6   Hematocrit 39.0 - 52.0 % 48.4  48.0  50.9   Platelets 150 - 400 K/uL 225  187  206       Latest Ref Rng & Units 08/01/2023   10:15 AM 01/31/2023    9:59 AM 08/01/2022  9:56 AM  CMP  Glucose 70 - 99 mg/dL 578  469  629   BUN 6 - 20 mg/dL 15  8  8    Creatinine 0.61 - 1.24 mg/dL 5.28  4.13  2.44   Sodium 135 - 145 mmol/L 131  134  133   Potassium 3.5 - 5.1 mmol/L 4.2  3.8  3.8   Chloride 98 - 111 mmol/L 100  103  103   CO2 22 - 32 mmol/L 21  22  23    Calcium  8.9 - 10.3 mg/dL 9.3  9.2  9.2   Total Protein 6.5 - 8.1 g/dL 7.9  7.7  7.7   Total Bilirubin 0.0 - 1.2 mg/dL 0.4  1.0  0.6   Alkaline Phos 38 - 126 U/L 95  100  112   AST 15 - 41 U/L 24  31  23    ALT 0 - 44 U/L 31  38  27    01/29/2018 baseline CEA 11.6  RADIOGRAPHIC STUDIES: I have personally reviewed the radiological images as listed and agreed with the findings in the report. [facility] surveillance imaging results were reviewed.

## 2023-08-01 NOTE — Progress Notes (Signed)
 Pt in for follow up, report having some muscular issues in leg at times and wants to discuss with MD.

## 2023-08-01 NOTE — Assessment & Plan Note (Signed)
Erythrocytosis has resolved. No need for phlebotomy.

## 2023-08-02 LAB — CEA: CEA: 3 ng/mL (ref 0.0–4.7)

## 2023-08-08 ENCOUNTER — Telehealth: Payer: Self-pay

## 2023-08-08 DIAGNOSIS — R911 Solitary pulmonary nodule: Secondary | ICD-10-CM

## 2023-08-08 DIAGNOSIS — C20 Malignant neoplasm of rectum: Secondary | ICD-10-CM

## 2023-08-08 NOTE — Telephone Encounter (Signed)
 Received message from Leone Ralphs stating that pt's wife would like for Dr. Wilhelmenia Harada to continue ordering scans.   Please schedule CT chest wo (between 12/30 and early Jan)  lab/ MD approx 1 week after CT   Please inform pt of appt details.

## 2023-12-25 ENCOUNTER — Ambulatory Visit: Payer: Medicare Other | Admitting: Dermatology

## 2024-01-19 ENCOUNTER — Other Ambulatory Visit: Payer: Self-pay | Admitting: Family Medicine

## 2024-01-19 DIAGNOSIS — M5416 Radiculopathy, lumbar region: Secondary | ICD-10-CM

## 2024-01-24 ENCOUNTER — Ambulatory Visit
Admission: RE | Admit: 2024-01-24 | Discharge: 2024-01-24 | Disposition: A | Discharge status: Home / Self Care | Source: Ambulatory Visit | Attending: Family Medicine | Admitting: Family Medicine

## 2024-01-24 DIAGNOSIS — M5416 Radiculopathy, lumbar region: Secondary | ICD-10-CM

## 2024-04-12 ENCOUNTER — Ambulatory Visit
Admission: RE | Admit: 2024-04-12 | Discharge: 2024-04-12 | Disposition: A | Discharge status: Home / Self Care | Source: Ambulatory Visit | Attending: Oncology | Admitting: Oncology

## 2024-04-12 DIAGNOSIS — R911 Solitary pulmonary nodule: Secondary | ICD-10-CM | POA: Insufficient documentation

## 2024-04-19 ENCOUNTER — Encounter: Payer: Self-pay | Admitting: Oncology

## 2024-04-19 ENCOUNTER — Inpatient Hospital Stay: Admitting: Oncology

## 2024-04-19 ENCOUNTER — Inpatient Hospital Stay: Attending: Oncology

## 2024-04-19 VITALS — BP 153/78 | HR 85 | Temp 97.9°F | Resp 18 | Wt 217.1 lb

## 2024-04-19 DIAGNOSIS — R911 Solitary pulmonary nodule: Secondary | ICD-10-CM

## 2024-04-19 DIAGNOSIS — R748 Abnormal levels of other serum enzymes: Secondary | ICD-10-CM | POA: Insufficient documentation

## 2024-04-19 DIAGNOSIS — C20 Malignant neoplasm of rectum: Secondary | ICD-10-CM | POA: Insufficient documentation

## 2024-04-19 DIAGNOSIS — Z720 Tobacco use: Secondary | ICD-10-CM | POA: Diagnosis not present

## 2024-04-19 DIAGNOSIS — F17210 Nicotine dependence, cigarettes, uncomplicated: Secondary | ICD-10-CM | POA: Insufficient documentation

## 2024-04-19 LAB — CBC WITH DIFFERENTIAL (CANCER CENTER ONLY)
Abs Immature Granulocytes: 0.04 K/uL (ref 0.00–0.07)
Basophils Absolute: 0.1 K/uL (ref 0.0–0.1)
Basophils Relative: 1 %
Eosinophils Absolute: 0.2 K/uL (ref 0.0–0.5)
Eosinophils Relative: 2 %
HCT: 48.3 % (ref 39.0–52.0)
Hemoglobin: 16.9 g/dL (ref 13.0–17.0)
Immature Granulocytes: 0 %
Lymphocytes Relative: 15 %
Lymphs Abs: 1.5 K/uL (ref 0.7–4.0)
MCH: 34.6 pg — ABNORMAL HIGH (ref 26.0–34.0)
MCHC: 35 g/dL (ref 30.0–36.0)
MCV: 98.8 fL (ref 80.0–100.0)
Monocytes Absolute: 0.8 K/uL (ref 0.1–1.0)
Monocytes Relative: 8 %
Neutro Abs: 7.3 K/uL (ref 1.7–7.7)
Neutrophils Relative %: 74 %
Platelet Count: 227 K/uL (ref 150–400)
RBC: 4.89 MIL/uL (ref 4.22–5.81)
RDW: 14.6 % (ref 11.5–15.5)
WBC Count: 9.8 K/uL (ref 4.0–10.5)
nRBC: 0 % (ref 0.0–0.2)

## 2024-04-19 LAB — CMP (CANCER CENTER ONLY)
ALT: 24 U/L (ref 0–44)
AST: 23 U/L (ref 15–41)
Albumin: 4.4 g/dL (ref 3.5–5.0)
Alkaline Phosphatase: 108 U/L (ref 38–126)
Anion gap: 12 (ref 5–15)
BUN: 11 mg/dL (ref 6–20)
CO2: 22 mmol/L (ref 22–32)
Calcium: 9.9 mg/dL (ref 8.9–10.3)
Chloride: 101 mmol/L (ref 98–111)
Creatinine: 1.1 mg/dL (ref 0.61–1.24)
GFR, Estimated: >60 mL/min
Glucose, Bld: 119 mg/dL — ABNORMAL HIGH (ref 70–99)
Potassium: 4.3 mmol/L (ref 3.5–5.1)
Sodium: 135 mmol/L (ref 135–145)
Total Bilirubin: 0.5 mg/dL (ref 0.0–1.2)
Total Protein: 7.8 g/dL (ref 6.5–8.1)

## 2024-04-19 NOTE — Progress Notes (Signed)
 " Hematology/Oncology Progress note [phone] [phone]     CHIEF COMPLAINTS/REASON FOR VISIT:  Follow-up for treatment of rectal cancer,  ASSESSMENT & PLAN:   Rectal cancer (HCC) #Low rectal cancer, s/p total neoadjuvant therapy.chemotherapy regimen with FOLFOX Q2w x 4 months, followed by concurrent Xeloda  with RT, Status post local excision- ypT1 disease,  February 2023 MRI pelvis, CT chest and abdomen showed no recurrence. Labs reviewed and discussed with patient.  CEA is pending at time of dictation. June 2024 colonoscopy negative for recurrence- recommend repeat in 3 years [June 2027]  Dec 2024  image surveillance at [facility] - no recurrence.  He is now more than 6 years after finishing rectal cancer treatment.  Additional image if clinical indicated   Lung nodule Stable lung nodules. Current smoker Recommend CT chest wo contrast annually.     Tobacco use Recommend smoke cessation.    Follow up 1 year All questions were answered. The patient knows to call the clinic with any problems, questions or concerns.  Zelphia Cap, MD, PhD [facility] Hematology Oncology 04/19/2024    HISTORY OF PRESENTING ILLNESS:  Jeffery Mccormick is a  59 y.o.  male presents for follow-up of rectal cancer.  Recently found to have elevated alkaline phosphatase. Rectal cancer cT3 N0 M0. 12/25/2017 colonoscopy : revealed anal mass, 5-6 cm from the anal verge.  # CT chest abdomen pelvis 01/16/2018 showed Wall thickening involving the lower rectum, corresponding to the patient's known rectal cancer. No findings specific for metastatic disease. Scattered small bilateral pulmonary nodules measuring up to 3 mm, nonspecific but not considered overly suspicious for metastatic disease.  #01/23/2018 MRI pelvis with and without contrast T3N0  #Cancer treatment TNT neoadjuvant protocol #02/09/2018 to 05/18/2018 FOLFOX q. 14 days x 8 #07/10/2018 finished concurrent chemo with  Xeloda /radiation  # Low rectal cancer, s/p total neoadjuvant therapy.chemotherapy regimen with FOLFOX Q2w x 4 months, followed by concurrent Xeloda  with RT,  Status post local excision- ypT1 disease,  09/25/2018 rectum local excision at [facility] Pathology showed minute focus of residual viable adenocarcinoma involving submucosa and superficial muscularis propria. Margins are negative. Patient opted to have local excision without proceeding with APR.  ypT1  #[facility] 04/05/2019 MRI pelvis with and without contrast . No evidence of tumor on current exam.  No evidence of metastatic disease in the pelvis. 04/22/2019, patient had colonoscopy done at [facility].  Patient had 3 small polyps in the sigmoid colon resected.  Scar in the distal rectum.  Biopsied.  Colon polyps are tubular adenoma and hyperplastic polyps.  Rectum biopsy showed hyperplastic polyp.  # 10/04/2019, CT chest abdomen pelvis showed bilateral scattered subcentimeter nodules unchanged from 01/16/2018.  Indeterminate but favored benign etiology given the stability.  Attention on follow-up.  No new nodules. CT abdomen pelvis showed no evidence of metastatic disease in the abdomen.  # MRI 04/26/2020 Since 07/23/2018, the primary tumor and extramural disease show postsurgical changes with predominantly fibrotic changes in the anterior rectum. No definite residual enhancing mass with restricted diffusion or enhancement identified.Posteriorly, there is suggestion of mild wall thickening with T2 intermediate to hyperintense signal without diffusion restriction. Findings are favored to represent hemorrhoids seen on the prior colonoscopy as well as the prior MRI. Correlate with recent colonoscopic findings.  Post treatment category: TRG stage 1  # MRI pelvis done at [facility] in January 2022.  No recurrence.  #Port-A-Cath in place, Mediport was removed by Dr. Tye on 01/24/2021  05/21/2021, MRI pelvis with and without contrast showed  no identifiable residual tumor.   Similar postsurgical and fibrotic changes along the anterior rectum without residual enhancing mass or diffusion restriction.  Similar intermediate T2 signal along the posterior rectal wall.  No suspicious enhancement or diffusion restriction.  Favored to represent hemorrhoids as seen on prior colonoscopy.  Posttreatment category TRG stage I. 05/21/2021, CT chest with contrast and CT abdomen with contrast showed no metastasis.  June 2024 colonoscopy at [facility] showed no recurrence, diverticulosis in sigmoid colon and ascending colon. Dec 2024 CT chest, abdomen and MRI pelvis showed no recurrence. Stable lung nodule.   INTERVAL HISTORY DEMARLO Mccormick is a 59 y.o. male who has above history reviewed by me today presents for rectal cancer cT3 N0 M0, status post total neoadjuvant treatments, status post local excision, ypT1  He reports feeling well. Currently he smokes daily. Accompanied by wife.   Back pain, and buttock to the right leg  Oct 2025 MRI lumbar spine showed  Moderate to large central and right paracentral disc protrusion at L4-5 with significant mass effect on the right side of the thecal sac most likely effecting the right L5 nerve root. He follows up with PMR for management.   Review of Systems  Constitutional:  Negative for chills, fever, malaise/fatigue and weight loss.  HENT:  Negative for nosebleeds and sore throat.   Eyes:  Negative for double vision, photophobia and redness.  Respiratory:  Negative for cough, shortness of breath and wheezing.   Cardiovascular:  Negative for chest pain, palpitations, orthopnea and leg swelling.  Gastrointestinal:  Negative for abdominal pain, blood in stool, diarrhea, nausea and vomiting.  Genitourinary:  Negative for dysuria.  Musculoskeletal:  Positive for back pain and joint pain. Negative for neck pain.  Skin:  Negative for itching and rash.  Neurological:  Negative for dizziness, tingling and tremors.  Endo/Heme/Allergies:  Negative for  environmental allergies. Does not bruise/bleed easily.  Psychiatric/Behavioral:  Negative for depression and hallucinations.     MEDICAL HISTORY:  Past Medical History:  Diagnosis Date   Hemorrhoid prolapse     SURGICAL HISTORY: Past Surgical History:  Procedure Laterality Date   COLONOSCOPY WITH PROPOFOL  N/A 12/25/2017   Procedure: COLONOSCOPY WITH PROPOFOL ;  Surgeon: Tye Millet, DO;  Location: [facility] ENDOSCOPY;  Service: General;  Laterality: N/A;   HERNIA REPAIR  07/2013   Right Inguinal-Dr. Wonda   PORTACATH PLACEMENT Right 02/03/2018   Procedure: INSERTION PORT-A-CATH;  Surgeon: Tye Millet, DO;  Location: [facility] ORS;  Service: General;  Laterality: Right;    SOCIAL HISTORY: Social History   Socioeconomic History   Marital status: Married    Spouse name: Not on file   Number of children: Not on file   Years of education: Not on file   Highest education level: Not on file  Occupational History   Occupation: unemployed  Tobacco Use   Smoking status: Every Day    Current packs/day: 1.00    Average packs/day: 1 pack/day for 30.0 years (30.0 ttl pk-yrs)    Types: Cigars, Cigarettes   Smokeless tobacco: Never  Vaping Use   Vaping status: Never Used  Substance and Sexual Activity   Alcohol use: Yes    Comment: occasionally   Drug use: No   Sexual activity: Not on file  Other Topics Concern   Not on file  Social History Narrative   Not on file   Social Drivers of Health   Tobacco Use: High Risk (04/19/2024)   Patient History    Smoking Tobacco Use:  Every Day    Smokeless Tobacco Use: Never    Passive Exposure: Not on file  Financial Resource Strain: Low Risk  (01/16/2024)   Received from [facility]   Overall Financial Resource Strain (CARDIA)    Difficulty of Paying Living Expenses: Not very hard  Food Insecurity: No Food Insecurity (01/16/2024)   Received from [facility]   Epic    Within the past 12 months, you worried  that your food would run out before you got the money to buy more.: Never true    Within the past 12 months, the food you bought just didn't last and you didn't have money to get more.: Never true  Transportation Needs: No Transportation Needs (01/16/2024)   Received from [facility] - Transportation    In the past 12 months, has lack of transportation kept you from medical appointments or from getting medications?: No    Lack of Transportation (Non-Medical): No  Physical Activity: Not on file  Stress: Not on file  Social Connections: Not on file  Intimate Partner Violence: Not on file  Depression (EYV7-0): Not on file  Alcohol Screen: Not on file  Housing: Low Risk  (01/28/2024)   Received from [facility]   Epic    In the last 12 months, was there a time when you were not able to pay the mortgage or rent on time?: No    In the past 12 months, how many times have you moved where you were living?: 0    At any time in the past 12 months, were you homeless or living in a shelter (including now)?: No  Utilities: Not At Risk (01/16/2024)   Received from [facility]   Epic    In the past 12 months has the electric, gas, oil, or water company threatened to shut off services in your home?: No  Health Literacy: Not on file    FAMILY HISTORY: Family History  Family history unknown: Yes  patient reports that he does not know any family history.   ALLERGIES:  has no known allergies.  MEDICATIONS:  Current Outpatient Medications  Medication Sig Dispense Refill   lisinopril (ZESTRIL) 20 MG tablet Take 20 mg by mouth daily.     imiquimod  (ALDARA ) 5 % cream Apply topically at bedtime. 3 times per week (Patient not taking: Reported on 04/19/2024) 12 each 6   No current facility-administered medications for this visit.   Facility-Administered Medications Ordered in Other Visits  Medication Dose Route Frequency Provider Last Rate  Last Admin   heparin  lock flush 100 unit/mL  500 Units Intravenous Once Babara Call, MD       sodium chloride  flush (NS) 0.9 % injection 10 mL  10 mL Intravenous Once Babara Call, MD       RADIOGRAPHIC STUDIES: I have personally reviewed the radiological images as listed and agreed with the findings in the report. CT Chest Wo Contrast Result Date: 04/16/2024 EXAM: CT CHEST WITHOUT CONTRAST 04/12/2024 10:02:37 AM TECHNIQUE: CT of the chest was performed without the administration of intravenous contrast. Multiplanar reformatted images are provided for review. Automated exposure control, iterative reconstruction, and/or weight based adjustment of the mA/kV was utilized to reduce the radiation dose to as low as reasonably achievable. COMPARISON: Comparison exam from 2020. CLINICAL HISTORY: History of pulmonary nodules and rectal cancer. * Tracking Code: BO * FINDINGS: MEDIASTINUM: Heart and pericardium are unremarkable. Coronary artery calcifications  are present. The central airways are clear. LYMPH NODES: No mediastinal, hilar or axillary lymphadenopathy. LUNGS AND PLEURA: There are scattered sub 5 mm pulmonary nodules unchanged from comparison exam in 2020. For example, a tiny nodule in the left upper lobe on image 49, series 3. The suspicious pulmonary nodules are identified. No focal consolidation or pulmonary edema. No pleural effusion or pneumothorax. SOFT TISSUES/BONES: No acute abnormality of the bones or soft tissues. UPPER ABDOMEN: Limited images of the upper abdomen demonstrates no acute abnormality. IMPRESSION: 1. Scattered sub-5 mm pulmonary nodules, unchanged from comparison exam in 2020. 2. No evidence of metastatic disease in the chest. Electronically signed by: Norleen Boxer MD MD 04/16/2024 10:57 AM EST RP Workstation: [id number]   MR LUMBAR SPINE WO CONTRAST Result Date: 01/24/2024 CLINICAL DATA:  Low back and right buttock and right lower extremity pain. EXAM: MRI LUMBAR SPINE WITHOUT CONTRAST  TECHNIQUE: Multiplanar, multisequence MR imaging of the lumbar spine was performed. No intravenous contrast was administered. COMPARISON:  CT scan from 2019 FINDINGS: Segmentation: There are five lumbar type vertebral bodies. The last full intervertebral disc space is labeled L5-S1. Alignment:  Normal Vertebrae: Very bright T1 marrow signal in the L5 vertebral body and in the upper sacral. Findings typically seen with radiation. Suspect prior radiation for patient's rectal cancer. No worrisome bone lesions or fractures the STIR sequence. Conus medullaris and cauda equina: Conus extends to the L1-2 level. Conus and cauda equina appear normal. Paraspinal and other soft tissues: No significant paraspinal retroperitoneal findings. No aortic aneurysm retroperitoneal adenopathy. Disc levels: L1-2: Mild degenerative disc disease and facet disease with mild bilateral lateral recess encroachment no significant spinal or foraminal stenosis. L2-3: Mild annular bulge and very shallow left-sided disc protrusion contributing to left lateral recess stenosis. No significant spinal or foraminal stenosis. L3-4: Mild bulging annulus and mild facet disease contributing to mild bilateral lateral recess encroachment. No spinal or foraminal stenosis. L4-5: Moderate to large central and right paracentral disc protrusion with significant mass effect on right side of the thecal sac most likely effecting the right L5 nerve root. The exiting L4 nerve roots appear normal. L5-S1: Advanced facet disease and ligamentum flavum thickening but no disc protrusions, spinal or foraminal stenosis. IMPRESSION: 1. Moderate to large central and right paracentral disc protrusion at L4-5 with significant mass effect on the right side of the thecal sac most likely effecting the right L5 nerve root. 2. Very shallow left-sided disc protrusion at L2-3 contributing to left lateral recess stenosis. 3. Mild bilateral lateral recess encroachment at L1-2 and L3-4. 4.  Very bright T1 marrow signal in the L5 vertebral body and in the upper sacral region. Findings typically seen with radiation. Suspect prior radiation for patient's rectal cancer. Electronically Signed   By: MYRTIS Stammer M.D.   On: 01/24/2024 10:10     PHYSICAL EXAMINATION: ECOG PERFORMANCE STATUS: 0 - Asymptomatic Vitals:   04/19/24 1010 04/19/24 1021  BP: (!) 165/79 (!) 153/78  Pulse: 85   Resp: 18   Temp: 97.9 F (36.6 C)   SpO2: 97%    Filed Weights   04/19/24 1010  Weight: 217 lb 1.6 oz (98.5 kg)    Physical Exam Constitutional:      General: He is not in acute distress.    Appearance: He is obese.  HENT:     Head: Normocephalic and atraumatic.  Eyes:     General: No scleral icterus. Cardiovascular:     Rate and Rhythm: Normal rate and regular rhythm.  Heart sounds: Normal heart sounds.  Pulmonary:     Effort: Pulmonary effort is normal. No respiratory distress.     Breath sounds: Normal breath sounds. No wheezing.  Abdominal:     General: Bowel sounds are normal. There is no distension.     Palpations: Abdomen is soft.     Tenderness: There is no abdominal tenderness.  Musculoskeletal:        General: No deformity. Normal range of motion.     Cervical back: Normal range of motion and neck supple.  Skin:    General: Skin is warm and dry.     Findings: No erythema or rash.  Neurological:     Mental Status: He is alert and oriented to person, place, and time. Mental status is at baseline.  Psychiatric:        Mood and Affect: Mood normal.      LABORATORY DATA:  I have reviewed the data as listed    Latest Ref Rng & Units 04/19/2024    9:48 AM 08/01/2023   10:15 AM 01/31/2023    9:59 AM  CBC  WBC 4.0 - 10.5 K/uL 9.8  8.5  6.6   Hemoglobin 13.0 - 17.0 g/dL 83.0  83.0  82.9   Hematocrit 39.0 - 52.0 % 48.3  48.4  48.0   Platelets 150 - 400 K/uL 227  225  187       Latest Ref Rng & Units 04/19/2024    9:48 AM 08/01/2023   10:15 AM 01/31/2023    9:59  AM  CMP  Glucose 70 - 99 mg/dL 880  889  894   BUN 6 - 20 mg/dL 11  15  8    Creatinine 0.61 - 1.24 mg/dL 8.89  8.99  8.99   Sodium 135 - 145 mmol/L 135  131  134   Potassium 3.5 - 5.1 mmol/L 4.3  4.2  3.8   Chloride 98 - 111 mmol/L 101  100  103   CO2 22 - 32 mmol/L 22  21  22    Calcium  8.9 - 10.3 mg/dL 9.9  9.3  9.2   Total Protein 6.5 - 8.1 g/dL 7.8  7.9  7.7   Total Bilirubin 0.0 - 1.2 mg/dL 0.5  0.4  1.0   Alkaline Phos 38 - 126 U/L 108  95  100   AST 15 - 41 U/L 23  24  31    ALT 0 - 44 U/L 24  31  38    01/29/2018 baseline CEA 11.6  RADIOGRAPHIC STUDIES: I have personally reviewed the radiological images as listed and agreed with the findings in the report. [facility] surveillance imaging results were reviewed. "

## 2024-04-19 NOTE — Assessment & Plan Note (Signed)
 Stable lung nodules. Current smoker Recommend CT chest wo contrast annually.

## 2024-04-19 NOTE — Assessment & Plan Note (Addendum)
#  Low rectal cancer, s/p total neoadjuvant therapy.chemotherapy regimen with FOLFOX Q2w x 4 months, followed by concurrent Xeloda  with RT, Status post local excision- ypT1 disease,  February 2023 MRI pelvis, CT chest and abdomen showed no recurrence. Labs reviewed and discussed with patient.  CEA is pending at time of dictation. June 2024 colonoscopy negative for recurrence- recommend repeat in 3 years [June 2027]  Dec 2024  image surveillance at [facility] - no recurrence.  He is now more than 6 years after finishing rectal cancer treatment.  Additional image if clinical indicated

## 2024-04-19 NOTE — Assessment & Plan Note (Signed)
 Recommend smoke cessation.

## 2024-04-20 LAB — CEA: CEA: 2.8 ng/mL (ref 0.0–4.7)

## 2025-04-19 ENCOUNTER — Ambulatory Visit

## 2025-04-26 ENCOUNTER — Inpatient Hospital Stay: Admitting: Oncology

## 2025-04-26 ENCOUNTER — Inpatient Hospital Stay
